# Patient Record
Sex: Female | Born: 1950 | Race: White | Hispanic: No | Marital: Single | Smoking: Never smoker
Health system: Southern US, Community
[De-identification: ages and names within clinical notes are randomized; demographics above are authoritative.]

## PROBLEM LIST (undated history)

## (undated) DIAGNOSIS — I10 Essential (primary) hypertension: Secondary | ICD-10-CM

## (undated) DIAGNOSIS — E7800 Pure hypercholesterolemia, unspecified: Secondary | ICD-10-CM

## (undated) DIAGNOSIS — K589 Irritable bowel syndrome without diarrhea: Secondary | ICD-10-CM

## (undated) DIAGNOSIS — I499 Cardiac arrhythmia, unspecified: Secondary | ICD-10-CM

## (undated) DIAGNOSIS — M069 Rheumatoid arthritis, unspecified: Secondary | ICD-10-CM

## (undated) DIAGNOSIS — R000 Tachycardia, unspecified: Secondary | ICD-10-CM

## (undated) HISTORY — DX: Rheumatoid arthritis, unspecified: M06.9

## (undated) HISTORY — DX: Pure hypercholesterolemia, unspecified: E78.00

## (undated) HISTORY — PX: TUBAL LIGATION: SHX77

---

## 2012-08-16 ENCOUNTER — Ambulatory Visit: Payer: Self-pay

## 2012-08-30 ENCOUNTER — Ambulatory Visit: Payer: Self-pay

## 2016-05-13 ENCOUNTER — Encounter (INDEPENDENT_AMBULATORY_CARE_PROVIDER_SITE_OTHER): Payer: Self-pay | Admitting: *Deleted

## 2016-05-28 ENCOUNTER — Encounter (INDEPENDENT_AMBULATORY_CARE_PROVIDER_SITE_OTHER): Payer: Self-pay | Admitting: *Deleted

## 2016-05-28 ENCOUNTER — Other Ambulatory Visit (INDEPENDENT_AMBULATORY_CARE_PROVIDER_SITE_OTHER): Payer: Self-pay | Admitting: *Deleted

## 2016-05-28 DIAGNOSIS — Z800 Family history of malignant neoplasm of digestive organs: Secondary | ICD-10-CM | POA: Insufficient documentation

## 2016-09-09 ENCOUNTER — Telehealth (INDEPENDENT_AMBULATORY_CARE_PROVIDER_SITE_OTHER): Payer: Self-pay | Admitting: *Deleted

## 2016-09-09 ENCOUNTER — Encounter (INDEPENDENT_AMBULATORY_CARE_PROVIDER_SITE_OTHER): Payer: Self-pay | Admitting: *Deleted

## 2016-09-09 NOTE — Telephone Encounter (Signed)
Referring MD/PCP: dolan   Procedure: tcs  Reason/Indication:  fam hx colon ca  Has patient had this procedure before?  Yes 6 yrs ago  If so, when, by whom and where?    Is there a family history of colon cancer?  Yes, father & sister  Who?  What age when diagnosed?    Is patient diabetic?   no      Does patient have prosthetic heart valve or mechanical valve?  no  Do you have a pacemaker?  no  Has patient ever had endocarditis? no  Has patient had joint replacement within last 12 months?  no  Does patient tend to be constipated or take laxatives? some  Does patient have a history of alcohol/drug use?  no  Is patient on Coumadin, Plavix and/or Aspirin? yes  Medications: asa 81 mg daily, benazepril 40 mg daily, furosemide 20 mg prn, naproxen 500 mg prn, pravastatin 40 mg daily, fexofenadine 60 mg prn, vit d3 200 iu daily, fish oil 1200 mg daily, meloxicam 15 mg prn, vir b injection once a month  Allergies: nkda  Medication Adjustment per Dr Laural Golden: asa 2 days  Procedure date & time: 10/07/16 at 730

## 2016-09-09 NOTE — Telephone Encounter (Signed)
Patient needs trilyte 

## 2016-09-10 MED ORDER — PEG 3350-KCL-NA BICARB-NACL 420 G PO SOLR: 4000.0000 mL | Freq: Once | ORAL | 0 refills | Status: AC

## 2016-09-10 NOTE — Telephone Encounter (Signed)
agree

## 2016-10-07 ENCOUNTER — Ambulatory Visit (HOSPITAL_COMMUNITY)
Admission: RE | Admit: 2016-10-07 | Discharge: 2016-10-07 | Disposition: A | Discharge status: Home / Self Care | Payer: Medicare HMO | Source: Ambulatory Visit | Attending: Internal Medicine | Admitting: Internal Medicine

## 2016-10-07 ENCOUNTER — Encounter (HOSPITAL_COMMUNITY): Payer: Self-pay

## 2016-10-07 ENCOUNTER — Encounter (HOSPITAL_COMMUNITY)
Admission: RE | Disposition: A | Discharge status: Home / Self Care | Payer: Self-pay | Source: Ambulatory Visit | Attending: Internal Medicine

## 2016-10-07 DIAGNOSIS — K644 Residual hemorrhoidal skin tags: Secondary | ICD-10-CM

## 2016-10-07 DIAGNOSIS — Z7982 Long term (current) use of aspirin: Secondary | ICD-10-CM | POA: Diagnosis not present

## 2016-10-07 DIAGNOSIS — Z79899 Other long term (current) drug therapy: Secondary | ICD-10-CM | POA: Diagnosis not present

## 2016-10-07 DIAGNOSIS — K589 Irritable bowel syndrome without diarrhea: Secondary | ICD-10-CM | POA: Diagnosis not present

## 2016-10-07 DIAGNOSIS — Z1211 Encounter for screening for malignant neoplasm of colon: Secondary | ICD-10-CM | POA: Insufficient documentation

## 2016-10-07 DIAGNOSIS — Z800 Family history of malignant neoplasm of digestive organs: Secondary | ICD-10-CM | POA: Insufficient documentation

## 2016-10-07 DIAGNOSIS — I10 Essential (primary) hypertension: Secondary | ICD-10-CM | POA: Diagnosis not present

## 2016-10-07 HISTORY — PX: COLONOSCOPY: SHX5424

## 2016-10-07 HISTORY — DX: Irritable bowel syndrome, unspecified: K58.9

## 2016-10-07 HISTORY — DX: Essential (primary) hypertension: I10

## 2016-10-07 SURGERY — COLONOSCOPY
Anesthesia: Moderate Sedation

## 2016-10-07 MED ORDER — MIDAZOLAM HCL 5 MG/5ML IJ SOLN
INTRAMUSCULAR | Status: DC | PRN
  Administered 2016-10-07 (×2): 2 mg via INTRAVENOUS

## 2016-10-07 MED ORDER — SIMETHICONE 40 MG/0.6ML PO SUSP
ORAL | Status: AC
  Filled 2016-10-07: qty 30

## 2016-10-07 MED ORDER — MEPERIDINE HCL 50 MG/ML IJ SOLN
INTRAMUSCULAR | Status: DC | PRN
  Administered 2016-10-07 (×2): 25 mg via INTRAVENOUS

## 2016-10-07 MED ORDER — MIDAZOLAM HCL 5 MG/5ML IJ SOLN
INTRAMUSCULAR | Status: AC
  Filled 2016-10-07: qty 10

## 2016-10-07 MED ORDER — MEPERIDINE HCL 50 MG/ML IJ SOLN
INTRAMUSCULAR | Status: AC
  Filled 2016-10-07: qty 1

## 2016-10-07 MED ORDER — SODIUM CHLORIDE 0.9 % IV SOLN
INTRAVENOUS | Status: DC
  Administered 2016-10-07: 07:00:00 via INTRAVENOUS

## 2016-10-07 NOTE — Discharge Instructions (Signed)
Resume usual medications and diet. No driving for 24 hours. Next colonoscopy in 5 years.   Colonoscopy, Adult, Care After This sheet gives you information about how to care for yourself after your procedure. Your health care provider may also give you more specific instructions. If you have problems or questions, contact your health care provider.  Dr Laural Golden:  [phone] What can I expect after the procedure? After the procedure, it is common to have:  A small amount of blood in your stool for 24 hours after the procedure.  Some gas.  Mild abdominal cramping or bloating.  Follow these instructions at home: General instructions   For the first 24 hours after the procedure: ? Do not drive or use machinery. ? Do not sign important documents. ? Do not drink alcohol. ? Rest often.  Take over-the-counter or prescription medicines only as told by your health care provider.  It is up to you to get the results of your procedure. Ask your health care provider, or the department performing the procedure, when your results will be ready. Relieving cramping and bloating  Try walking around when you have cramps or feel bloated. Eating and drinking  Drink enough fluid to keep your urine clear or pale yellow.  Resume your normal diet as instructed by your health care provider. Avoid heavy or fried foods that are hard to digest.  Avoid drinking alcohol for as long as instructed by your health care provider. Contact a health care provider if:  You have blood in your stool 2-3 days after the procedure. Get help right away if:  You have more than a small spotting of blood in your stool.  You pass large blood clots in your stool.  Your abdomen is swollen.  You have nausea or vomiting.  You have a fever.  You have increasing abdominal pain that is not relieved with medicine. This information is not intended to replace advice given to you by your health care provider. Make sure you  discuss any questions you have with your health care provider. Document Released: 08/05/2003 Document Revised: 09/15/2015 Document Reviewed: 03/04/2015 Elsevier Interactive Patient Education  [employer].

## 2016-10-07 NOTE — H&P (Signed)
Tammy Jensen is an 66 y.o. female.   Chief Complaint: patient is here for colonoscopy. HPI: patient is 66 year old Caucasian female who is here for colonoscopy.she denies abdominal pain change in bowel habits or rectal bleeding. She has IBS and her bowels are always irregular. Last colonoscopy was normal in 2013. Family history significant for CRC and father who was in his late  21'sAnd her sister also had colon carcinoma at age 9 and is doing fine for years later.  Past Medical History:  Diagnosis Date  . Hypertension   . IBS (irritable bowel syndrome)     Past Surgical History:  Procedure Laterality Date  . TUBAL LIGATION      Family History  Problem Relation Age of Onset  . Colon cancer Father   . Colon cancer Sister    Social History:  reports that she has never smoked. She has never used smokeless tobacco. She reports that she drinks alcohol. She reports that she does not use drugs.  Allergies: No Known Allergies  Medications Prior to Admission  Medication Sig Dispense Refill  . aspirin 81 MG tablet Take 81 mg by mouth daily.    . benazepril (LOTENSIN) 40 MG tablet Take 40 mg by mouth daily.    . Cholecalciferol (D 2000) 2000 units TABS Take 2,000 Units by mouth daily.    . cyanocobalamin (,VITAMIN B-12,) 1000 MCG/ML injection Inject 1,000 mcg into the muscle every 30 (thirty) days. Due 10-06-11    . DEXILANT 30 MG capsule Take 30 mg by mouth daily as needed for indigestion.  0  . fexofenadine (ALLEGRA) 60 MG tablet Take 60 mg by mouth 2 (two) times daily as needed for allergies or rhinitis.    . furosemide (LASIX) 20 MG tablet Take 20 mg by mouth daily.    . hydroxypropyl methylcellulose / hypromellose (ISOPTO TEARS / GONIOVISC) 2.5 % ophthalmic solution Place 1 drop into both eyes as needed for dry eyes.    . naproxen (NAPROSYN) 500 MG tablet Take 500 mg by mouth 2 (two) times daily as needed for mild pain.    . Omega-3 Fatty Acids (FISH OIL) 1200 MG CAPS Take 1,200 mg  by mouth daily.    . pravastatin (PRAVACHOL) 40 MG tablet Take 40 mg by mouth every evening.    . triamcinolone cream (KENALOG) 0.1 % Apply 1 application topically daily as needed (rash).      No results found for this or any previous visit (from the past 48 hour(s)). No results found.  ROS  Blood pressure (!) 143/89, pulse 65, temperature 97.7 F (36.5 C), temperature source Oral, resp. rate 12, height 5\' 3"  (1.6 m), weight 179 lb (81.2 kg), SpO2 99 %. Physical Exam  Constitutional: She appears well-developed and well-nourished.  HENT:  Mouth/Throat: Oropharynx is clear and moist.  Eyes: Conjunctivae are normal. No scleral icterus.  Neck: No thyromegaly present.  Cardiovascular: Normal rate, regular rhythm and normal heart sounds.   No murmur heard. Respiratory: Effort normal and breath sounds normal.  GI: Soft. She exhibits no distension. There is no tenderness.  Musculoskeletal: She exhibits no edema.  Lymphadenopathy:    She has no cervical adenopathy.  Neurological: She is alert.  Skin: Skin is warm and dry.     Assessment/Plan High risk screening colonoscopy.  Hildred Laser, MD 10/07/2016, 7:33 AM

## 2016-10-07 NOTE — Op Note (Signed)
[facility] Patient Name: Tammy Jensen Procedure Date: 10/07/2016 7:11 AM MRN: [id number] Date of Birth: 1950/09/06 Attending MD: Hildred Laser , MD CSN: [id number] Age: 66 Admit Type: Outpatient Procedure:                Colonoscopy Indications:              Screening in patient at increased risk: Colorectal                            cancer in father 45 or older, Screening in patient                            at increased risk: Colorectal cancer in sister 12                            or older Providers:                Hildred Laser, MD, Janeece Riggers, RN, Randa Spike,                            Technician Referring MD:             Margy Clarks, NP Medicines:                Meperidine 50 mg IV, Midazolam 5 mg IV Complications:            No immediate complications. Estimated Blood Loss:     Estimated blood loss: none. Procedure:                Pre-Anesthesia Assessment:                           - Prior to the procedure, a History and Physical                            was performed, and patient medications and                            allergies were reviewed. The patient's tolerance of                            previous anesthesia was also reviewed. The risks                            and benefits of the procedure and the sedation                            options and risks were discussed with the patient.                            All questions were answered, and informed consent                            was obtained. Prior Anticoagulants: The patient  last took aspirin 4 days prior to the procedure.                            ASA Grade Assessment: II - A patient with mild                            systemic disease. After reviewing the risks and                            benefits, the patient was deemed in satisfactory                            condition to undergo the procedure.                           After obtaining informed  consent, the colonoscope                            was passed under direct vision. Throughout the                            procedure, the patient's blood pressure, pulse, and                            oxygen saturations were monitored continuously. The                            EC-[id number] ([id number]) scope was introduced through                            the anus and advanced to the the cecum, identified                            by appendiceal orifice and ileocecal valve. The                            colonoscopy was performed without difficulty. The                            patient tolerated the procedure well. The quality                            of the bowel preparation was excellent. The                            ileocecal valve, appendiceal orifice, and rectum                            were photographed. Scope In: 7:42:44 AM Scope Out: 7:53:31 AM Scope Withdrawal Time: 0 hours 6 minutes 24 seconds  Total Procedure Duration: 0 hours 10 minutes 47 seconds  Findings:      The perianal exam findings include skin tags.      The digital rectal exam was normal.  The colon (entire examined portion) appeared normal.      External hemorrhoids were found during retroflexion. The hemorrhoids       were small. Impression:               - Perianal skin tags found on perianal exam.                           - The entire examined colon is normal.                           - External hemorrhoids.                           - No specimens collected. Moderate Sedation:      Moderate (conscious) sedation was administered by the endoscopy nurse       and supervised by the endoscopist. The following parameters were       monitored: oxygen saturation, heart rate, blood pressure, CO2       capnography and response to care. Total physician intraservice time was       17 minutes. Recommendation:           - Patient has a contact number available for                            emergencies.  The signs and symptoms of potential                            delayed complications were discussed with the                            patient. Return to normal activities tomorrow.                            Written discharge instructions were provided to the                            patient.                           - Resume previous diet today.                           - Continue present medications.                           - Repeat colonoscopy in 5 years for screening                            purposes. Procedure Code(s):        --- Professional ---                           [id number], Colonoscopy, flexible; diagnostic, including                            collection of specimen(s) by brushing or washing,  when performed (separate procedure)                           99152, Moderate sedation services provided by the                            same physician or other qualified health care                            professional performing the diagnostic or                            therapeutic service that the sedation supports,                            requiring the presence of an independent trained                            observer to assist in the monitoring of the                            patient's level of consciousness and physiological                            status; initial 15 minutes of intraservice time,                            patient age 19 years or older Diagnosis Code(s):        --- Professional ---                           K64.4, Residual hemorrhoidal skin tags                           Z80.0, Family history of malignant neoplasm of                            digestive organs CPT copyright 2016 American Medical Association. All rights reserved. The codes documented in this report are preliminary and upon coder review may  be revised to meet current compliance requirements. Hildred Laser, MD Hildred Laser, MD 10/07/2016  8:02:14 AM This report has been signed electronically. Number of Addenda: 0

## 2016-10-11 ENCOUNTER — Encounter (HOSPITAL_COMMUNITY): Payer: Self-pay | Admitting: Internal Medicine

## 2017-12-03 ENCOUNTER — Emergency Department (HOSPITAL_COMMUNITY): Payer: 59

## 2017-12-03 ENCOUNTER — Encounter (HOSPITAL_COMMUNITY): Payer: Self-pay | Admitting: Emergency Medicine

## 2017-12-03 ENCOUNTER — Emergency Department (HOSPITAL_COMMUNITY)
Admission: EM | Admit: 2017-12-03 | Discharge: 2017-12-03 | Disposition: A | Discharge status: Home / Self Care | Payer: 59 | Attending: Emergency Medicine | Admitting: Emergency Medicine

## 2017-12-03 ENCOUNTER — Other Ambulatory Visit: Payer: Self-pay

## 2017-12-03 DIAGNOSIS — Y939 Activity, unspecified: Secondary | ICD-10-CM | POA: Insufficient documentation

## 2017-12-03 DIAGNOSIS — Y999 Unspecified external cause status: Secondary | ICD-10-CM | POA: Diagnosis not present

## 2017-12-03 DIAGNOSIS — S0990XA Unspecified injury of head, initial encounter: Secondary | ICD-10-CM | POA: Diagnosis present

## 2017-12-03 DIAGNOSIS — Z7982 Long term (current) use of aspirin: Secondary | ICD-10-CM | POA: Diagnosis not present

## 2017-12-03 DIAGNOSIS — Y92008 Other place in unspecified non-institutional (private) residence as the place of occurrence of the external cause: Secondary | ICD-10-CM | POA: Diagnosis not present

## 2017-12-03 DIAGNOSIS — Z79899 Other long term (current) drug therapy: Secondary | ICD-10-CM | POA: Diagnosis not present

## 2017-12-03 DIAGNOSIS — S93602A Unspecified sprain of left foot, initial encounter: Secondary | ICD-10-CM | POA: Insufficient documentation

## 2017-12-03 DIAGNOSIS — R55 Syncope and collapse: Secondary | ICD-10-CM | POA: Diagnosis not present

## 2017-12-03 DIAGNOSIS — I10 Essential (primary) hypertension: Secondary | ICD-10-CM | POA: Diagnosis not present

## 2017-12-03 DIAGNOSIS — W108XXA Fall (on) (from) other stairs and steps, initial encounter: Secondary | ICD-10-CM | POA: Diagnosis not present

## 2017-12-03 LAB — URINALYSIS, ROUTINE W REFLEX MICROSCOPIC
Bacteria, UA: NONE SEEN
Bilirubin Urine: NEGATIVE
Glucose, UA: NEGATIVE mg/dL
Ketones, ur: NEGATIVE mg/dL
Nitrite: NEGATIVE
Protein, ur: NEGATIVE mg/dL
SPECIFIC GRAVITY, URINE: 1.011 (ref 1.005–1.030)
pH: 6 (ref 5.0–8.0)

## 2017-12-03 LAB — COMPREHENSIVE METABOLIC PANEL
ALBUMIN: 4.3 g/dL (ref 3.5–5.0)
ALT: 43 U/L (ref 0–44)
AST: 46 U/L — AB (ref 15–41)
Alkaline Phosphatase: 87 U/L (ref 38–126)
Anion gap: 8 (ref 5–15)
BUN: 14 mg/dL (ref 8–23)
CHLORIDE: 106 mmol/L (ref 98–111)
CO2: 23 mmol/L (ref 22–32)
CREATININE: 0.79 mg/dL (ref 0.44–1.00)
Calcium: 9 mg/dL (ref 8.9–10.3)
GFR calc Af Amer: >60 mL/min (ref >60)
GFR calc non Af Amer: >60 mL/min (ref >60)
GLUCOSE: 108 mg/dL — AB (ref 70–99)
Potassium: 3.6 mmol/L (ref 3.5–5.1)
SODIUM: 137 mmol/L (ref 135–145)
Total Bilirubin: 0.8 mg/dL (ref 0.3–1.2)
Total Protein: 8.1 g/dL (ref 6.5–8.1)

## 2017-12-03 LAB — CBC WITH DIFFERENTIAL/PLATELET
Abs Immature Granulocytes: 0.01 10*3/uL (ref 0.00–0.07)
BASOS ABS: 0.1 10*3/uL (ref 0.0–0.1)
BASOS PCT: 1 %
EOS ABS: 0.2 10*3/uL (ref 0.0–0.5)
Eosinophils Relative: 2 %
HCT: 45.6 % (ref 36.0–46.0)
Hemoglobin: 14.9 g/dL (ref 12.0–15.0)
IMMATURE GRANULOCYTES: 0 %
LYMPHS ABS: 1.6 10*3/uL (ref 0.7–4.0)
Lymphocytes Relative: 24 %
MCH: 29.2 pg (ref 26.0–34.0)
MCHC: 32.7 g/dL (ref 30.0–36.0)
MCV: 89.4 fL (ref 80.0–100.0)
Monocytes Absolute: 0.5 10*3/uL (ref 0.1–1.0)
Monocytes Relative: 7 %
NEUTROS PCT: 66 %
NRBC: 0 % (ref 0.0–0.2)
Neutro Abs: 4.5 10*3/uL (ref 1.7–7.7)
PLATELETS: 235 10*3/uL (ref 150–400)
RBC: 5.1 MIL/uL (ref 3.87–5.11)
RDW: 12.9 % (ref 11.5–15.5)
WBC: 6.9 10*3/uL (ref 4.0–10.5)

## 2017-12-03 LAB — CBG MONITORING, ED: Glucose-Capillary: 107 mg/dL — ABNORMAL HIGH (ref 70–99)

## 2017-12-03 LAB — TROPONIN I

## 2017-12-03 MED ORDER — IBUPROFEN 600 MG PO TABS: 600.0000 mg | ORAL_TABLET | Freq: Four times a day (QID) | ORAL | 0 refills | Status: DC | PRN

## 2017-12-03 MED ORDER — SODIUM CHLORIDE 0.9 % IV SOLN
INTRAVENOUS | Status: DC
  Administered 2017-12-03: 17:00:00 via INTRAVENOUS

## 2017-12-03 MED ORDER — SODIUM CHLORIDE 0.9 % IV BOLUS
1000.0000 mL | Freq: Once | INTRAVENOUS | Status: AC
  Administered 2017-12-03: 1000 mL via INTRAVENOUS

## 2017-12-03 MED ORDER — HYDROCODONE-ACETAMINOPHEN 5-325 MG PO TABS: 1.0000 | ORAL_TABLET | ORAL | 0 refills | Status: DC | PRN

## 2017-12-03 NOTE — ED Provider Notes (Signed)
[facility] EMERGENCY DEPARTMENT Provider Note   CSN: [id number] Arrival date & time: 12/03/17  1416     History   Chief Complaint Chief Complaint  Patient presents with  . Loss of Consciousness    HPI Tammy Jensen is a 67 y.o. female.  Pt presents to the ED today with LOC and left ankle pain.  Pt was walking down her stairs on Thursday, 11/ 28.  She missed a step and fell.  She does not remember falling.  She woke up on the ground.  Her daughter said she was awake.  The pt said she had a syncopal episode in September, but never saw the doctor.  Pt c/o left ankle and foot pain and bilateral knee and hip pain and LBP.       Past Medical History:  Diagnosis Date  . Hypertension   . IBS (irritable bowel syndrome)     Patient Active Problem List   Diagnosis Date Noted  . Family history of colon cancer 05/28/2016    Past Surgical History:  Procedure Laterality Date  . COLONOSCOPY N/A 10/07/2016   Procedure: COLONOSCOPY;  Surgeon: Rogene Houston, MD;  Location: [facility] ENDO SUITE;  Service: Endoscopy;  Laterality: N/A;  730  . TUBAL LIGATION       OB History   None      Home Medications    Prior to Admission medications   Medication Sig Start Date End Date Taking? Authorizing Provider  aspirin 81 MG tablet Take 81 mg by mouth daily. 03/10/06  Yes [provider]  benazepril (LOTENSIN) 40 MG tablet Take 40 mg by mouth daily. 03/14/06  Yes [provider]  Cholecalciferol (D 2000) 2000 units TABS Take 2,000 Units by mouth daily.   Yes [provider]  cyanocobalamin (,VITAMIN B-12,) 1000 MCG/ML injection Inject 1,000 mcg into the muscle every 30 (thirty) days. Due 10-06-11   Yes [provider]  DEXILANT 30 MG capsule Take 30 mg by mouth daily as needed for indigestion. 09/16/16  Yes [provider]  fexofenadine (ALLEGRA) 60 MG tablet Take 60 mg by mouth 2 (two) times daily as needed for allergies or rhinitis.   Yes [provider]  furosemide (LASIX) 20 MG tablet Take 20 mg by mouth daily.   Yes [provider]  hydroxypropyl methylcellulose / hypromellose (ISOPTO TEARS / GONIOVISC) 2.5 % ophthalmic solution Place 1 drop into both eyes as needed for dry eyes.   Yes [provider]  naproxen (NAPROSYN) 500 MG tablet Take 500 mg by mouth 2 (two) times daily as needed for mild pain.   Yes [provider]  Omega-3 Fatty Acids (FISH OIL) 1200 MG CAPS Take 1,200 mg by mouth daily.   Yes [provider]  pravastatin (PRAVACHOL) 40 MG tablet Take 40 mg by mouth every evening.   Yes [provider]  triamcinolone cream (KENALOG) 0.1 % Apply 1 application topically daily as needed (rash).   Yes [provider]  HYDROcodone-acetaminophen (NORCO/VICODIN) 5-325 MG tablet Take 1 tablet by mouth every 4 (four) hours as needed. 12/03/17   Tammy Pence, MD  ibuprofen (ADVIL,MOTRIN) 600 MG tablet Take 1 tablet (600 mg total) by mouth every 6 (six) hours as needed. 12/03/17   Tammy Pence, MD    Family History Family History  Problem Relation Age of Onset  . Colon cancer Father   . Colon cancer Sister     Social History Social History   Tobacco Use  .  Smoking status: Never Smoker  . Smokeless tobacco: Never Used  Substance Use Topics  . Alcohol use: Yes    Comment: occasional  . Drug use: No     Allergies   Patient has no known allergies.   Review of Systems Review of Systems  Musculoskeletal: Positive for back pain.       Left foot and ankle pain Bilateral knee pain Bilateral hip pain  All other systems reviewed and are negative.    Physical Exam Updated Vital Signs BP (!) 140/99   Pulse 81   Temp 98.2 F (36.8 C) (Oral)   Resp (!) 23   Ht 5\' 3"  (1.6 m)   Wt 81.6 kg   SpO2 98%   BMI 31.89 kg/m   Physical Exam  Constitutional: She is oriented to person, place, and time. She appears well-developed and well-nourished.  HENT:    Head: Normocephalic and atraumatic.  Right Ear: External ear normal.  Left Ear: External ear normal.  Nose: Nose normal.  Mouth/Throat: Oropharynx is clear and moist.  Eyes: Pupils are equal, round, and reactive to light. Conjunctivae and EOM are normal.  Neck: Normal range of motion. Neck supple.  Cardiovascular: Normal rate, regular rhythm, normal heart sounds and intact distal pulses.  Pulmonary/Chest: Effort normal and breath sounds normal.  Abdominal: Soft. Bowel sounds are normal.  Musculoskeletal:       Right hip: She exhibits tenderness.       Left hip: She exhibits tenderness.       Right knee: Tenderness found.       Left knee: Tenderness found.       Left ankle: She exhibits decreased range of motion, swelling, ecchymosis and deformity. Tenderness.       Lumbar back: She exhibits tenderness.  Neurological: She is alert and oriented to person, place, and time.  Skin: Skin is warm and dry. Capillary refill takes less than 2 seconds.  Psychiatric: She has a normal mood and affect. Her behavior is normal. Judgment and thought content normal.  Nursing note and vitals reviewed.    ED Treatments / Results  Labs (all labs ordered are listed, but only abnormal results are displayed) Labs Reviewed  COMPREHENSIVE METABOLIC PANEL - Abnormal; Notable for the following components:      Result Value   Glucose, Bld 108 (*)    AST 46 (*)    All other components within normal limits  URINALYSIS, ROUTINE W REFLEX MICROSCOPIC - Abnormal; Notable for the following components:   Hgb urine dipstick SMALL (*)    Leukocytes, UA TRACE (*)    Non Squamous Epithelial 0-5 (*)    All other components within normal limits  CBG MONITORING, ED - Abnormal; Notable for the following components:   Glucose-Capillary 107 (*)    All other components within normal limits  CBC WITH DIFFERENTIAL/PLATELET  TROPONIN I    EKG None  Radiology Dg Chest 2 View  Result Date: 12/03/2017 CLINICAL DATA:   Fall.  Syncope.  Pain. EXAM: CHEST - 2 VIEW COMPARISON:  None. FINDINGS: The heart, hila, and mediastinum are normal. No pneumothorax. No pulmonary nodules, masses, or focal infiltrates. No convincing evidence of fracture. IMPRESSION: No active cardiopulmonary disease. Electronically Signed   By: Dorise Bullion III M.D   On: 12/03/2017 16:24   Dg Lumbar Spine Complete  Result Date: 12/03/2017 CLINICAL DATA:  Pain after fall EXAM: LUMBAR SPINE - COMPLETE 4+ VIEW COMPARISON:  None. FINDINGS: No fracture or traumatic malalignment. Calcified atherosclerosis  in the abdominal aorta. Minimal degenerative disc disease. No other acute abnormalities. IMPRESSION: No fracture or traumatic malalignment. Electronically Signed   By: Dorise Bullion III M.D   On: 12/03/2017 16:25   Dg Pelvis 1-2 Views  Result Date: 12/03/2017 CLINICAL DATA:  Pain after fall EXAM: PELVIS - 1-2 VIEW COMPARISON:  None. FINDINGS: Degenerative changes at the pubic symphysis. No fractures are noted. IMPRESSION: Negative. Electronically Signed   By: Dorise Bullion III M.D   On: 12/03/2017 16:27   Dg Ankle Complete Left  Result Date: 12/03/2017 CLINICAL DATA:  Pain after fall EXAM: LEFT ANKLE COMPLETE - 3+ VIEW COMPARISON:  None. FINDINGS: Soft tissue swelling in the ankle. IMPRESSION: Negative. Electronically Signed   By: Dorise Bullion III M.D   On: 12/03/2017 16:37   Ct Head Wo Contrast  Result Date: 12/03/2017 CLINICAL DATA:  Syncopal episodes.  Post fall. EXAM: CT HEAD WITHOUT CONTRAST TECHNIQUE: Contiguous axial images were obtained from the base of the skull through the vertex without intravenous contrast. COMPARISON:  None. FINDINGS: Brain: No evidence of acute infarction, hemorrhage, hydrocephalus, extra-axial collection or mass lesion/mass effect. Vascular: Mild calcific atherosclerotic disease of the intra cavernous carotid arteries. Skull: Normal. Negative for fracture or focal lesion. Sinuses/Orbits: No acute finding.  Other: None. IMPRESSION: No acute intracranial abnormality. Electronically Signed   By: Fidela Salisbury M.D.   On: 12/03/2017 16:00   Dg Knee Complete 4 Views Left  Result Date: 12/03/2017 CLINICAL DATA:  Pain after fall EXAM: LEFT KNEE - COMPLETE 4+ VIEW COMPARISON:  None. FINDINGS: No evidence of fracture, dislocation, or joint effusion. No evidence of arthropathy or other focal bone abnormality. Soft tissues are unremarkable. IMPRESSION: Negative. Electronically Signed   By: Dorise Bullion III M.D   On: 12/03/2017 16:29   Dg Knee Complete 4 Views Right  Result Date: 12/03/2017 CLINICAL DATA:  Pain after fall EXAM: RIGHT KNEE - COMPLETE 4+ VIEW COMPARISON:  None. FINDINGS: No evidence of fracture, dislocation, or joint effusion. No evidence of arthropathy or other focal bone abnormality. Soft tissues are unremarkable. IMPRESSION: Negative. Electronically Signed   By: Dorise Bullion III M.D   On: 12/03/2017 16:28   Dg Foot Complete Left  Result Date: 12/03/2017 CLINICAL DATA:  Pain after fall EXAM: LEFT FOOT - COMPLETE 3+ VIEW COMPARISON:  None. FINDINGS: Soft tissue calcifications along the lateral midfoot likely represent multi partite os perineum. An os posterior to the talus on the lateral view is most consistent with an os trigonum. There may be soft tissue swelling posterior to the os trigonum on the lateral view only. No definitive acute fractures noted. IMPRESSION: 1. Increased attenuation in the fat posterior to the suspected os trigonum may represent soft tissue edema/sequela of trauma. Recommend clinical correlation. If there is high concern in this region, a CT scan may better evaluate. 2. No convincing evidence of acute fracture on this study. Electronically Signed   By: Dorise Bullion III M.D   On: 12/03/2017 16:46    Procedures Procedures (including critical care time)  Medications Ordered in ED Medications  sodium chloride 0.9 % bolus 1,000 mL (1,000 mLs Intravenous New  Bag/Given 12/03/17 1520)    And  0.9 %  sodium chloride infusion ( Intravenous New Bag/Given 12/03/17 1645)     Initial Impression / Assessment and Plan / ED Course  I have reviewed the triage vital signs and the nursing notes.  Pertinent labs & imaging results that were available during my care of the  patient were reviewed by me and considered in my medical decision making (see chart for details).    Pt will be placed in a cam walker and instructed to f/u with ortho.  It does not sound like she had true syncope as her daughter said she was awake, so I don't think she needs admission.  The syncopal work up here was negative.  The pt knows to return if worse.  Final Clinical Impressions(s) / ED Diagnoses   Final diagnoses:  Syncope, unspecified syncope type  Foot sprain, left, initial encounter    ED Discharge Orders         Ordered    HYDROcodone-acetaminophen (NORCO/VICODIN) 5-325 MG tablet  Every 4 hours PRN     12/03/17 1700    ibuprofen (ADVIL,MOTRIN) 600 MG tablet  Every 6 hours PRN     12/03/17 1700           Tammy Pence, MD 12/03/17 1702

## 2017-12-03 NOTE — ED Notes (Signed)
Continues in Rad 

## 2017-12-03 NOTE — ED Notes (Signed)
Syncopal  Episode on Thursday  Seen in fast track for ankle injury  Has had syncopal episodes in the past

## 2017-12-03 NOTE — ED Triage Notes (Addendum)
Patient c/o left ankle pain after having syncopal episode Thursday while walking down porch stairs. Per patient woke up on the ground with pain in the ankle. Denies headache, blurred vision, dizziness. Denies taking any anticoagulants. Per patient has had syncope in past but never checked for it. Patient denies any other pain. Swelling to left ankle and foot.

## 2017-12-03 NOTE — ED Notes (Signed)
Dr H in to assess

## 2017-12-03 NOTE — ED Notes (Signed)
Pt reports she has no idea when she is going to pass out States she has no hx of seizures, HI   Monitor SR with 1 PVC noted  She reports she has never spoken to her PCP regarding passing out

## 2017-12-03 NOTE — ED Notes (Signed)
Ortho VS  Lying  137/104,83,16 99 per cent RA  Sitting  140/99, 89,16.98 per cent RA  Standing  118/93, 92,20,98 per cet RA

## 2018-03-03 ENCOUNTER — Emergency Department (HOSPITAL_COMMUNITY)
Admission: EM | Admit: 2018-03-03 | Discharge: 2018-03-03 | Disposition: A | Discharge status: Home / Self Care | Payer: 59 | Attending: Emergency Medicine | Admitting: Emergency Medicine

## 2018-03-03 ENCOUNTER — Encounter (HOSPITAL_COMMUNITY): Payer: Self-pay | Admitting: Emergency Medicine

## 2018-03-03 ENCOUNTER — Emergency Department (HOSPITAL_COMMUNITY): Payer: 59

## 2018-03-03 ENCOUNTER — Other Ambulatory Visit: Payer: Self-pay

## 2018-03-03 DIAGNOSIS — M25512 Pain in left shoulder: Secondary | ICD-10-CM | POA: Insufficient documentation

## 2018-03-03 DIAGNOSIS — Z79899 Other long term (current) drug therapy: Secondary | ICD-10-CM | POA: Insufficient documentation

## 2018-03-03 DIAGNOSIS — Z7982 Long term (current) use of aspirin: Secondary | ICD-10-CM | POA: Insufficient documentation

## 2018-03-03 DIAGNOSIS — I10 Essential (primary) hypertension: Secondary | ICD-10-CM | POA: Insufficient documentation

## 2018-03-03 MED ORDER — HYDROCODONE-ACETAMINOPHEN 5-325 MG PO TABS: 2.0000 | ORAL_TABLET | ORAL | 0 refills | Status: DC | PRN

## 2018-03-03 NOTE — ED Provider Notes (Signed)
[facility] EMERGENCY DEPARTMENT Provider Note   CSN: [id number] Arrival date & time: 03/03/18  1014    History   Chief Complaint Chief Complaint  Patient presents with  . Arm Pain    HPI Tammy Jensen is a 68 y.o. female.     HPI   She complains of pain in her left shoulder radiating to her left trapezius region.  Pain started yesterday after moving some laminate flooring.  She had trouble sleeping because of the pain and did not improve when she took ibuprofen.  She could not work today, because of the discomfort.  She came here for evaluation, by private vehicle.  No prior injuries to the left shoulder or neck.  She did not fall.  There are no other known modifying factors.  Past Medical History:  Diagnosis Date  . Hypertension   . IBS (irritable bowel syndrome)     Patient Active Problem List   Diagnosis Date Noted  . Family history of colon cancer 05/28/2016    Past Surgical History:  Procedure Laterality Date  . COLONOSCOPY N/A 10/07/2016   Procedure: COLONOSCOPY;  Surgeon: Rogene Houston, MD;  [facility];  Service: Endoscopy;  Laterality: N/A;  730  . TUBAL LIGATION       OB History   No obstetric history on file.      Home Medications    Prior to Admission medications   Medication Sig Start Date End Date Taking? Authorizing Provider  aspirin 81 MG tablet Take 81 mg by mouth daily. 03/10/06   [provider]  benazepril (LOTENSIN) 40 MG tablet Take 40 mg by mouth daily. 03/14/06   [provider]  Cholecalciferol (D 2000) 2000 units TABS Take 2,000 Units by mouth daily.    [provider]  cyanocobalamin (,VITAMIN B-12,) 1000 MCG/ML injection Inject 1,000 mcg into the muscle every 30 (thirty) days. Due 10-06-11    [provider]  DEXILANT 30 MG capsule Take 30 mg by mouth daily as needed for indigestion. 09/16/16   [provider]  fexofenadine (ALLEGRA) 60 MG tablet Take 60 mg by mouth 2 (two)  times daily as needed for allergies or rhinitis.    [provider]  furosemide (LASIX) 20 MG tablet Take 20 mg by mouth daily.    [provider]  HYDROcodone-acetaminophen (NORCO) 5-325 MG tablet Take 2 tablets by mouth every 4 (four) hours as needed for moderate pain. 03/03/18   Daleen Bo, MD  hydroxypropyl methylcellulose / hypromellose (ISOPTO TEARS / GONIOVISC) 2.5 % ophthalmic solution Place 1 drop into both eyes as needed for dry eyes.    [provider]  ibuprofen (ADVIL,MOTRIN) 600 MG tablet Take 1 tablet (600 mg total) by mouth every 6 (six) hours as needed. 12/03/17   Isla Pence, MD  naproxen (NAPROSYN) 500 MG tablet Take 500 mg by mouth 2 (two) times daily as needed for mild pain.    [provider]  Omega-3 Fatty Acids (FISH OIL) 1200 MG CAPS Take 1,200 mg by mouth daily.    [provider]  pravastatin (PRAVACHOL) 40 MG tablet Take 40 mg by mouth every evening.    [provider]  triamcinolone cream (KENALOG) 0.1 % Apply 1 application topically daily as needed (rash).    [provider]    Family History Family History  Problem Relation Age of Onset  . Colon cancer Father   . Colon cancer Sister     Social History Social  History   Tobacco Use  . Smoking status: Never Smoker  . Smokeless tobacco: Never Used  Substance Use Topics  . Alcohol use: Yes    Comment: occasional  . Drug use: No     Allergies   Patient has no known allergies.   Review of Systems Review of Systems  All other systems reviewed and are negative.    Physical Exam Updated Vital Signs BP 133/77 (BP Location: Right Arm)   Pulse 73   Temp 98.2 F (36.8 C) (Oral)   Resp 16   Ht 5\' 3"  (1.6 m)   Wt 81.6 kg   SpO2 99%   BMI 31.89 kg/m   Physical Exam Vitals signs and nursing note reviewed.  Constitutional:      General: She is not in acute distress.    Appearance: She is well-developed. She is obese. She is not  ill-appearing, toxic-appearing or diaphoretic.     Comments: Elderly, frail  HENT:     Head: Normocephalic and atraumatic.  Eyes:     Conjunctiva/sclera: Conjunctivae normal.     Pupils: Pupils are equal, round, and reactive to light.  Neck:     Musculoskeletal: Normal range of motion and neck supple.     Trachea: Phonation normal.  Cardiovascular:     Rate and Rhythm: Normal rate.  Pulmonary:     Effort: Pulmonary effort is normal.  Musculoskeletal:     Comments: She guards against movement of the left shoulder in all directions.  Motion is better with passive manipulation.  No shoulder deformity.  Mild tenderness left trapezius, and left shoulder diffusely.  Neurovascular, motion and function intact distally in the left wrist and hand.  Skin:    General: Skin is warm and dry.  Neurological:     Mental Status: She is alert and oriented to person, place, and time.     Motor: No abnormal muscle tone.  Psychiatric:        Behavior: Behavior normal.        Thought Content: Thought content normal.        Judgment: Judgment normal.      ED Treatments / Results  Labs (all labs ordered are listed, but only abnormal results are displayed) Labs Reviewed - No data to display  EKG None  Radiology Dg Shoulder Left  Result Date: 03/03/2018 CLINICAL DATA:  She said that she had to carry flooring into the house on Wednesday. She started having severe lt shoulder pain yesterday. She is unable to really move or raise arm EXAM: LEFT SHOULDER - 2+ VIEW COMPARISON:  None. FINDINGS: No fracture or bone lesion. Glenohumeral and AC joints are normally spaced and aligned. No significant arthropathic/degenerative change. Bones are demineralized. Soft tissues are unremarkable. IMPRESSION: No fracture or joint abnormality.  No bone lesion. Electronically Signed   By: Lajean Manes M.D.   On: 03/03/2018 11:38    Procedures Procedures (including critical care time)  Medications Ordered in  ED Medications - No data to display   Initial Impression / Assessment and Plan / ED Course  I have reviewed the triage vital signs and the nursing notes.  Pertinent labs & imaging results that were available during my care of the patient were reviewed by me and considered in my medical decision making (see chart for details).  Clinical Course as of Mar 03 1242  Fri Mar 03, 2018  1235 Arm sling ordered to be applied by nursing.   [EW]    Clinical Course  User Index [EW] Daleen Bo, MD       *  Patient Vitals for the past 24 hrs:  BP Temp Temp src Pulse Resp SpO2 Height Weight  03/03/18 1030 - - - - - - 5\' 3"  (1.6 m) 81.6 kg  03/03/18 1027 133/77 98.2 F (36.8 C) Oral 73 16 99 % - -    12:44 PM Reevaluation with update and discussion. After initial assessment and treatment, an updated evaluation reveals no change in clinical status.  Findings discussed with the patient and her daughter and all questions were answered. Daleen Bo   Medical Decision Making: Pain left shoulder secondary to unusual work activity.  Doubt fracture, cervical radiculopathy/myelopathy.  No indication for further ED treatment at this time.  CRITICAL CARE-no Performed by: Daleen Bo  Nursing Notes Reviewed/ Care Coordinated Applicable Imaging Reviewed Interpretation of Laboratory Data incorporated into ED treatment  The patient appears reasonably screened and/or stabilized for discharge and I doubt any other medical condition or other [facility] requiring further screening, evaluation, or treatment in the ED at this time prior to discharge.  Plan: Home Medications-ibuprofen 3 times daily, continue usual medications; Home Treatments-ice to affected area and sling for comfort; return here if the recommended treatment, does not improve the symptoms; Recommended follow up-PCP if not better in 1 week and as needed.   Final Clinical Impressions(s) / ED Diagnoses   Final diagnoses:  Acute pain of left  shoulder    ED Discharge Orders         Ordered    HYDROcodone-acetaminophen (NORCO) 5-325 MG tablet  Every 4 hours PRN     03/03/18 1242           Daleen Bo, MD 03/03/18 1244

## 2018-03-03 NOTE — ED Triage Notes (Signed)
Patient complains of left arm pain that started last night when moving packages of laminate flooring. States pain runs down from shoulder to elbow.

## 2018-03-03 NOTE — Discharge Instructions (Addendum)
Wear the sling for comfort.  Use ice on the sore area 3 or 4 times a day for 2 more days after that use heat.  When you start using heat you can begin to gently move the shoulder by swinging it forward and backwards, and in circles.  Gradually, you will be able to lift it up above your shoulder and head.  Continue taking ibuprofen 400 mg 3 times a day with meals for 1 week.  This can help for inflammation that occurs with your type of injury.  If you are not better in 1 week see your primary care doctor.  Do not drive when taking the narcotic pain reliever, hydrocodone.  You need to have at least 6 hours of time after taking the pill before driving.

## 2018-03-03 NOTE — ED Notes (Signed)
Patient transported to X-ray 

## 2018-09-21 ENCOUNTER — Other Ambulatory Visit: Payer: Self-pay

## 2018-09-21 ENCOUNTER — Encounter (HOSPITAL_COMMUNITY): Payer: Self-pay

## 2018-09-21 ENCOUNTER — Emergency Department (HOSPITAL_COMMUNITY)
Admission: EM | Admit: 2018-09-21 | Discharge: 2018-09-21 | Disposition: A | Discharge status: Home / Self Care | Payer: 59 | Attending: Emergency Medicine | Admitting: Emergency Medicine

## 2018-09-21 DIAGNOSIS — Y929 Unspecified place or not applicable: Secondary | ICD-10-CM | POA: Insufficient documentation

## 2018-09-21 DIAGNOSIS — Z79899 Other long term (current) drug therapy: Secondary | ICD-10-CM | POA: Diagnosis not present

## 2018-09-21 DIAGNOSIS — Y999 Unspecified external cause status: Secondary | ICD-10-CM | POA: Diagnosis not present

## 2018-09-21 DIAGNOSIS — I10 Essential (primary) hypertension: Secondary | ICD-10-CM | POA: Insufficient documentation

## 2018-09-21 DIAGNOSIS — Y939 Activity, unspecified: Secondary | ICD-10-CM | POA: Diagnosis not present

## 2018-09-21 DIAGNOSIS — S60572A Other superficial bite of hand of left hand, initial encounter: Secondary | ICD-10-CM | POA: Diagnosis not present

## 2018-09-21 DIAGNOSIS — Z7982 Long term (current) use of aspirin: Secondary | ICD-10-CM | POA: Diagnosis not present

## 2018-09-21 DIAGNOSIS — W5501XA Bitten by cat, initial encounter: Secondary | ICD-10-CM | POA: Diagnosis not present

## 2018-09-21 MED ORDER — AMOXICILLIN-POT CLAVULANATE 875-125 MG PO TABS: 1.0000 | ORAL_TABLET | Freq: Two times a day (BID) | ORAL | 0 refills | Status: AC

## 2018-09-21 MED ORDER — AMOXICILLIN-POT CLAVULANATE 875-125 MG PO TABS: 1.0000 | ORAL_TABLET | Freq: Two times a day (BID) | ORAL | 0 refills | Status: DC

## 2018-09-21 NOTE — ED Triage Notes (Signed)
Pt reports has been feeding a stray cat for the past month and the cat bit her r middle finger last night.  Pt says woke up this morning and it was red and swollen.

## 2018-09-21 NOTE — ED Provider Notes (Signed)
[facility] EMERGENCY DEPARTMENT Provider Note   CSN: [id number] Arrival date & time: 09/21/18  0809   History   Chief Complaint Chief Complaint  Patient presents with  . Animal Bite    HPI Tammy Jensen is a 68 y.o. female presenting to the emergency department with swelling of distal phalanges of middle finger on left hand after a stray cat bit her. The patient has been putting food out for the cat over the past several days and slowly building a relationship with it. Yesterday morning she was feeding the cat and got too close to its food when it bit her finger. The patient states her fingertip bled a lot, "it was dripping", but is taking aspirin 81mg  daily. She washed the bite with soap and water before covering it with Neosporin.  Later that day, she cleaned it again with hydrogen peroxide before covering it again with Neosporin.  This morning she noticed her fingertip was more red and more swollen compared to yesterday.  There is a particularly tender spot directly medial to the bite mark, she feels the redness has spread down her finger little bit more.  Otherwise, she denies fevers, chills, body aches, headaches, vision changes, chest pain, shortness of breath, nausea, vomiting, and abdominal pain.  She does not know if the cat is vaccinated against rabies.  Past Medical History:  Diagnosis Date  . Hypertension   . IBS (irritable bowel syndrome)     Patient Active Problem List   Diagnosis Date Noted  . Family history of colon cancer 05/28/2016    Past Surgical History:  Procedure Laterality Date  . COLONOSCOPY N/A 10/07/2016   Procedure: COLONOSCOPY;  Surgeon: Rogene Houston, MD;  [facility];  Service: Endoscopy;  Laterality: N/A;  730  . TUBAL LIGATION       OB History   No obstetric history on file.     Home Medications    Prior to Admission medications   Medication Sig Start Date End Date Taking? Authorizing Provider  aspirin 81 MG tablet Take  81 mg by mouth daily. 03/10/06   [provider]  benazepril (LOTENSIN) 40 MG tablet Take 40 mg by mouth daily. 03/14/06   [provider]  Cholecalciferol (D 2000) 2000 units TABS Take 2,000 Units by mouth daily.    [provider]  cyanocobalamin (,VITAMIN B-12,) 1000 MCG/ML injection Inject 1,000 mcg into the muscle every 30 (thirty) days. Due 10-06-11    [provider]  DEXILANT 30 MG capsule Take 30 mg by mouth daily as needed for indigestion. 09/16/16   [provider]  fexofenadine (ALLEGRA) 60 MG tablet Take 60 mg by mouth 2 (two) times daily as needed for allergies or rhinitis.    [provider]  furosemide (LASIX) 20 MG tablet Take 20 mg by mouth daily.    [provider]  HYDROcodone-acetaminophen (NORCO) 5-325 MG tablet Take 2 tablets by mouth every 4 (four) hours as needed for moderate pain. 03/03/18   Daleen Bo, MD  hydroxypropyl methylcellulose / hypromellose (ISOPTO TEARS / GONIOVISC) 2.5 % ophthalmic solution Place 1 drop into both eyes as needed for dry eyes.    [provider]  ibuprofen (ADVIL,MOTRIN) 600 MG tablet Take 1 tablet (600 mg total) by mouth every 6 (six) hours as needed. 12/03/17   Isla Pence, MD  naproxen (NAPROSYN) 500 MG tablet Take 500 mg by mouth 2 (two) times daily as needed for mild pain.    [provider]  Omega-3 Fatty Acids (FISH OIL) 1200 MG CAPS Take 1,200 mg by mouth daily.    [provider]  pravastatin (PRAVACHOL) 40 MG tablet Take 40 mg by mouth every evening.    [provider]  triamcinolone cream (KENALOG) 0.1 % Apply 1 application topically daily as needed (rash).    [provider]    Family History Family History  Problem Relation Age of Onset  . Colon cancer Father   . Colon cancer Sister     Social History Social History   Tobacco Use  . Smoking status: Never Smoker  . Smokeless tobacco: Never Used  Substance Use  Topics  . Alcohol use: Yes    Comment: occasional  . Drug use: No     Allergies   Patient has no known allergies.   Review of Systems Review of Systems - see HPI   Physical Exam Updated Vital Signs BP (!) 159/99 (BP Location: Left Arm)   Pulse 90   Temp 98.2 F (36.8 C) (Oral)   Resp 18   Ht 5\' 3"  (1.6 m)   Wt 81.6 kg   SpO2 100%   BMI 31.89 kg/m   Physical Exam Constitutional:      Appearance: She is not toxic-appearing.  Cardiovascular:     Rate and Rhythm: Normal rate and regular rhythm.     Pulses: Normal pulses.     Heart sounds: Normal heart sounds. No murmur.  Pulmonary:     Effort: Pulmonary effort is normal.     Breath sounds: Normal breath sounds.  Abdominal:     General: Bowel sounds are normal.     Palpations: Abdomen is soft.  Lymphadenopathy:     Cervical: No cervical adenopathy.  Skin:    General: Skin is warm.     Capillary Refill: Capillary refill takes less than 2 seconds.     Findings: Lesion (2 well-healed punctate lesions to distal aspect of middle finger of the left hand, well-healed without evidence of infection, discharge, drainage, or bleeding; erythema and tenderness to palpation are appreciated, no well-defined or demarcated erythematous) present.  Neurological:     General: No focal deficit present.     Mental Status: She is alert.     Cranial Nerves: No cranial nerve deficit.    ED Treatments / Results  Labs (all labs ordered are listed, but only abnormal results are displayed) Labs Reviewed - No data to display  EKG None  Radiology No results found.  Procedures Procedures (including critical care time)  Medications Ordered in ED Medications - No data to display   Initial Impression / Assessment and Plan / ED Course  I have reviewed the triage vital signs and the nursing notes.  Pertinent labs & imaging results that were available during my care of the patient were reviewed by me and considered in my medical  decision making (see chart for details).  Cat Bite: Patient bitten by a stray cat in her neighborhood that she has been taking care of and feeding.  By park today is without any evidence of infection, drainage, or bleeding, but is more inflamed, erythematous, and tender to touch.  Vital signs stable. -Patient encouraged to follow with PCP within the next week -Patient prescribed amoxicillin 875-125 mg 2 times daily for the next 7 days -Patient instructed to keep the bite clean with soap and water, cover with a bandage and Neosporin -Patient given detailed instructions regarding decision to vaccinate against rabies versus contacting  animal control to capture the animal and observe it for any discouraging signs of rabies infection  Final Clinical Impressions(s) / ED Diagnoses   Final diagnoses:  None    ED Discharge Orders    None     Milus Banister, [place], PGY-2 09/21/2018 9:15 AM    Daisy Floro, DO 09/21/18 [id number]    Elnora Morrison, MD 09/23/18 [phone]

## 2018-09-21 NOTE — Discharge Instructions (Addendum)
You have been prescribed Augmentin, which is an antibiotic commonly used to fight off any bacterial infections from cat bites.  Please take this medication twice daily for the next 7 days.  This medication may cause some stomach upset or diarrhea, in which case it might be better to take it with food.  Please take this medication for the entire course (all 14 tablets), even if your symptoms resolve and you feel better. Please follow-up with your primary care physician within the next week to have your wound looked at.  If the wound starts to change, appears more infectious or forms of abscess, please be reevaluated and retreated.  Your physician may decide to stop your treatment early or to prolong your treatment.  Either way, it is vital that you follow-up with them within the next week. Keep the bite clean with soap and water.  Keep it covered with a bandage and Neosporin. With any bite mark comes a risk of being exposed to rabies.  As the cat was not having any unusual behaviors, my personal suspicion for rabies in this animal is low.  However, it is wise to contact animal control to have them pick up the animal for it to be quarantined and observed for any unusual behaviors.  If at any point you feel unsure, please come back to the hospital to be vaccinated for rabies.  You can take Tylenol 500 mg every 6 hours as needed for pain control.  I hope you feel better soon.  It was a pleasure to take care of you today!  Milus Banister, [place], PGY-2 09/21/2018 9:23 AM

## 2019-03-01 ENCOUNTER — Other Ambulatory Visit (HOSPITAL_COMMUNITY): Payer: Self-pay | Admitting: Family Medicine

## 2019-03-01 DIAGNOSIS — Z13820 Encounter for screening for osteoporosis: Secondary | ICD-10-CM

## 2019-03-01 DIAGNOSIS — Z1231 Encounter for screening mammogram for malignant neoplasm of breast: Secondary | ICD-10-CM

## 2019-03-12 ENCOUNTER — Ambulatory Visit (HOSPITAL_COMMUNITY): Payer: 59

## 2019-03-12 ENCOUNTER — Ambulatory Visit (INDEPENDENT_AMBULATORY_CARE_PROVIDER_SITE_OTHER): Payer: 59 | Admitting: Orthopedic Surgery

## 2019-03-12 ENCOUNTER — Other Ambulatory Visit: Payer: Self-pay

## 2019-03-12 ENCOUNTER — Encounter: Payer: Self-pay | Admitting: Orthopedic Surgery

## 2019-03-12 VITALS — BP 138/93 | HR 97 | Ht 63.0 in | Wt 180.0 lb

## 2019-03-12 DIAGNOSIS — R9431 Abnormal electrocardiogram [ECG] [EKG]: Secondary | ICD-10-CM | POA: Insufficient documentation

## 2019-03-12 DIAGNOSIS — M25432 Effusion, left wrist: Secondary | ICD-10-CM | POA: Diagnosis not present

## 2019-03-12 DIAGNOSIS — I10 Essential (primary) hypertension: Secondary | ICD-10-CM | POA: Insufficient documentation

## 2019-03-12 DIAGNOSIS — R079 Chest pain, unspecified: Secondary | ICD-10-CM | POA: Insufficient documentation

## 2019-03-12 DIAGNOSIS — E669 Obesity, unspecified: Secondary | ICD-10-CM | POA: Insufficient documentation

## 2019-03-12 DIAGNOSIS — M2550 Pain in unspecified joint: Secondary | ICD-10-CM | POA: Diagnosis not present

## 2019-03-12 MED ORDER — MELOXICAM 7.5 MG PO TABS: 7.5000 mg | ORAL_TABLET | Freq: Every day | ORAL | 5 refills | Status: DC

## 2019-03-12 MED ORDER — PREDNISONE 10 MG PO TABS: 10.0000 mg | ORAL_TABLET | Freq: Three times a day (TID) | ORAL | 0 refills | Status: DC

## 2019-03-12 NOTE — Progress Notes (Signed)
Tammy Jensen  03/12/2019  Body mass index is 31.89 kg/m.   HISTORY SECTION :  Chief Complaint  Patient presents with  . Wrist Pain    right greater than left hand pain    HPI The patient presents for evaluation of  (mild/moderate/severe/ ) right wrist pain and swelling with numbness and tingling in the hand when it was swollen, this was relieved by dose of prednisone.  She is relatively asymptomatic at this point.  She does note a history of rheumatoid arthritis in her family although she has not been diagnosed with that  She has some mild pain in the left hand and wrist area as well    Review of Systems  HENT: Positive for tinnitus.   Cardiovascular: Positive for palpitations.  Musculoskeletal: Positive for back pain and joint pain.  Skin: Positive for itching.  Endo/Heme/Allergies: Positive for environmental allergies.  All other systems reviewed and are negative.    has a past medical history of Hypertension and IBS (irritable bowel syndrome).   Past Surgical History:  Procedure Laterality Date  . COLONOSCOPY N/A 10/07/2016   Procedure: COLONOSCOPY;  Surgeon: Rogene Houston, MD;  [facility];  Service: Endoscopy;  Laterality: N/A;  730  . TUBAL LIGATION      Body mass index is 31.89 kg/m.   No Known Allergies   Current Outpatient Medications:  .  amLODipine (NORVASC) 5 MG tablet, Take 5 mg by mouth daily., Disp: , Rfl:  .  aspirin 81 MG tablet, Take 81 mg by mouth daily., Disp: , Rfl:  .  benazepril (LOTENSIN) 40 MG tablet, Take 40 mg by mouth daily., Disp: , Rfl:  .  Cholecalciferol (D 2000) 2000 units TABS, Take 2,000 Units by mouth daily., Disp: , Rfl:  .  cyanocobalamin (,VITAMIN B-12,) 1000 MCG/ML injection, Inject 1,000 mcg into the muscle every 30 (thirty) days. Due 10-06-11, Disp: , Rfl:  .  fexofenadine (ALLEGRA) 60 MG tablet, Take 60 mg by mouth 2 (two) times daily as needed for allergies or rhinitis., Disp: , Rfl:  .  furosemide (LASIX)  20 MG tablet, Take 20 mg by mouth daily., Disp: , Rfl:  .  HYDROcodone-acetaminophen (NORCO) 5-325 MG tablet, Take 2 tablets by mouth every 4 (four) hours as needed for moderate pain., Disp: 15 tablet, Rfl: 0 .  ibuprofen (ADVIL,MOTRIN) 600 MG tablet, Take 1 tablet (600 mg total) by mouth every 6 (six) hours as needed., Disp: 30 tablet, Rfl: 0 .  naproxen (NAPROSYN) 500 MG tablet, Take 500 mg by mouth 2 (two) times daily as needed for mild pain., Disp: , Rfl:  .  Omega-3 Fatty Acids (FISH OIL) 1200 MG CAPS, Take 1,200 mg by mouth daily., Disp: , Rfl:  .  triamcinolone cream (KENALOG) 0.1 %, Apply 1 application topically daily as needed (rash)., Disp: , Rfl:  .  DEXILANT 30 MG capsule, Take 30 mg by mouth daily as needed for indigestion., Disp: , Rfl: 0 .  hydroxypropyl methylcellulose / hypromellose (ISOPTO TEARS / GONIOVISC) 2.5 % ophthalmic solution, Place 1 drop into both eyes as needed for dry eyes., Disp: , Rfl:  .  meloxicam (MOBIC) 7.5 MG tablet, Take 1 tablet (7.5 mg total) by mouth daily., Disp: 30 tablet, Rfl: 5 .  pravastatin (PRAVACHOL) 40 MG tablet, Take 40 mg by mouth every evening., Disp: , Rfl:  .  predniSONE (DELTASONE) 10 MG tablet, Take 1 tablet (10 mg total) by mouth 3 (three) times daily., Disp: 42 tablet,  Rfl: 0   PHYSICAL EXAM SECTION: 1) BP (!) 138/93   Pulse 97   Ht 5\' 3"  (1.6 m)   Wt 180 lb (81.6 kg)   BMI 31.89 kg/m   Body mass index is 31.89 kg/m. General appearance: Well-developed well-nourished no gross deformities  2) Cardiovascular normal pulse and perfusion , normal color   3) Neurologically deep tendon reflexes are equal and normal, no sensation loss or deficits no pathologic reflexes  4) Psychological: Awake alert and oriented x3 mood and affect normal  5) Skin no lacerations or ulcerations no nodularity no palpable masses, no erythema or nodularity  6) Musculoskeletal:   Two-point discrimination was normal, sharp touch was normal soft touch was  normal, she had tenderness over the carpal tunnel and tendons in the FCR and FCU area with a negative carpal tunnel Phalen's test normal grip some swelling at the MTP joints none in the IP joints   MEDICAL DECISION MAKING  A.  Encounter Diagnoses  Name Primary?  . Multiple joint pain Yes  . Wrist swelling, left     B. DATA ANALYSED:  IMAGING: Independent interpretation of images: Yes, 3 views right wrist normal alignment osteopenia no joint space narrowing or erosions at the joint margins  Orders: We ordered rheumatoid arthritis testing  Outside records reviewed: Yes [facility] sent over records indicating that the patient had some right wrist pain they thought it might be carpal tunnel and recommended referral for possible injection.  Patient is on vitamin D3 she takes furosemide amlodipine calcium aspirin  C. MANAGEMENT  Order rheumatoid arthritis markers to see if that is a possible cause  Start NSAIDs as noted below  If she has swelling again she has a standing dose of prednisone that she can take  [facility] will do her labs and she will call us the day after or day of and then let us get her an appointment for 1 to 2 days after we get the laboratory studies back  Meds ordered this encounter  Medications  . meloxicam (MOBIC) 7.5 MG tablet    Sig: Take 1 tablet (7.5 mg total) by mouth daily.    Dispense:  30 tablet    Refill:  5  . predniSONE (DELTASONE) 10 MG tablet    Sig: Take 1 tablet (10 mg total) by mouth 3 (three) times daily.    Dispense:  42 tablet    Refill:  0    Acute (less than or equal to 4 weeks) uncomplicated illness independent, x-ray prescription  Arther Abbott, MD  03/12/2019 10:01 AM

## 2019-03-12 NOTE — Patient Instructions (Addendum)
Start nsaid   If the swelling starts start the prednisone and stop the nsaid  [facility] for Rh A tests  Rheumatoid Arthritis Rheumatoid arthritis (RA) is a long-term (chronic) disease. RA causes inflammation in your joints. Your joints may feel painful, stiff, swollen, and warm. RA may start slowly. It most often affects the small joints of the hands and feet. It can also affect other parts of the body. Symptoms of RA often come and go. There is no cure for RA, but medicines can help your symptoms. What are the causes?  RA is an autoimmune disease. This means that your body's defense system (immune system) attacks healthy parts of your body by mistake. The exact cause of RA is not known. What increases the risk?  Being a woman.  Having a family history of RA or other diseases like RA.  Smoking.  Being overweight.  Being exposed to pollutants or chemicals. What are the signs or symptoms?  Morning stiffness that lasts longer than 30 minutes. This is often the first symptom.  Symptoms start slowly. They are often worse in the morning.  As RA gets worse, symptoms may include: ? Pain, stiffness, swelling, warmth, and tenderness in joints on both sides of your body. ? Loss of energy. ? Not feeling hungry. ? Weight loss. ? A low fever. ? Dry eyes and a dry mouth. ? Firm lumps that grow under your skin. ? Changes in the way your joints look. ? Changes in the way your joints work.  Symptoms vary and they: ? Often come and go. ? Sometimes get worse for a period of time. These are called flares. How is this treated?   Treatment may include: ? Taking good care of yourself. Be sure to rest as needed, eat a healthy diet, and exercise. ? Medicines. These may include:  Pain relievers.  Medicines to help with inflammation.  Disease-modifying antirheumatic drugs (DMARDs).  Medicines called biologic response modifiers. ? Physical therapy and occupational therapy. ? Surgery, if  joint damage is very bad. Your doctor will work with you to find the best treatments. Follow these instructions at home: Activity  Return to your normal activities as told by your doctor. Ask your doctor what activities are safe for you.  Rest when you have a flare.  Exercise as told by your doctor. General instructions  Take over-the-counter and prescription medicines only as told by your doctor.  Keep all follow-up visits as told by your doctor. This is important. Where to find more information  [employer]: www.rheumatology.[place]: www.arthritis.org Contact a doctor if:  You have a flare.  You have a fever.  You have problems because of your medicines. Get help right away if:  You have chest pain.  You have trouble breathing.  You get a hot, painful joint all of a sudden, and it is worse than your normal joint aches. Summary  RA is a long-term disease.  Symptoms of RA start slowly. They are often worse in the morning.  RA causes inflammation in your joints. This information is not intended to replace advice given to you by your health care provider. Make sure you discuss any questions you have with your health care provider. Document Revised: 08/24/2017 Document Reviewed: 08/24/2017 Elsevier Patient Education  2020 [employer].

## 2019-03-19 ENCOUNTER — Other Ambulatory Visit: Payer: Self-pay

## 2019-03-19 ENCOUNTER — Ambulatory Visit (HOSPITAL_COMMUNITY)
Admission: RE | Admit: 2019-03-19 | Discharge: 2019-03-19 | Disposition: A | Discharge status: Home / Self Care | Payer: 59 | Source: Ambulatory Visit | Attending: Family Medicine | Admitting: Family Medicine

## 2019-03-19 DIAGNOSIS — Z780 Asymptomatic menopausal state: Secondary | ICD-10-CM | POA: Insufficient documentation

## 2019-03-19 DIAGNOSIS — Z13820 Encounter for screening for osteoporosis: Secondary | ICD-10-CM | POA: Insufficient documentation

## 2019-03-19 DIAGNOSIS — Z1231 Encounter for screening mammogram for malignant neoplasm of breast: Secondary | ICD-10-CM | POA: Insufficient documentation

## 2019-03-21 ENCOUNTER — Inpatient Hospital Stay
Admission: RE | Admit: 2019-03-21 | Discharge: 2019-03-21 | Disposition: A | Discharge status: Home / Self Care | Payer: Self-pay | Source: Ambulatory Visit | Attending: Family Medicine | Admitting: Family Medicine

## 2019-03-21 ENCOUNTER — Other Ambulatory Visit (HOSPITAL_COMMUNITY): Payer: Self-pay | Admitting: Family Medicine

## 2019-03-21 DIAGNOSIS — Z1231 Encounter for screening mammogram for malignant neoplasm of breast: Secondary | ICD-10-CM

## 2019-04-13 ENCOUNTER — Ambulatory Visit: Payer: 59 | Admitting: Orthopedic Surgery

## 2019-04-27 ENCOUNTER — Ambulatory Visit (INDEPENDENT_AMBULATORY_CARE_PROVIDER_SITE_OTHER): Payer: 59 | Admitting: Orthopedic Surgery

## 2019-04-27 ENCOUNTER — Encounter: Payer: Self-pay | Admitting: Orthopedic Surgery

## 2019-04-27 ENCOUNTER — Other Ambulatory Visit: Payer: Self-pay

## 2019-04-27 VITALS — BP 140/90 | HR 62 | Ht 63.0 in | Wt 189.0 lb

## 2019-04-27 DIAGNOSIS — M2550 Pain in unspecified joint: Secondary | ICD-10-CM | POA: Diagnosis not present

## 2019-04-27 DIAGNOSIS — R768 Other specified abnormal immunological findings in serum: Secondary | ICD-10-CM | POA: Diagnosis not present

## 2019-04-27 DIAGNOSIS — M25432 Effusion, left wrist: Secondary | ICD-10-CM

## 2019-04-27 MED ORDER — PREDNISONE 10 MG PO TABS: 10.0000 mg | ORAL_TABLET | Freq: Three times a day (TID) | ORAL | 1 refills | Status: DC

## 2019-04-27 NOTE — Progress Notes (Signed)
No chief complaint on file.   Encounter Diagnoses  Name Primary?  . Multiple joint pain Yes  . Wrist swelling, left     Prior visit  Chief Complaint  Patient presents with  . Wrist Pain      right greater than left hand pain     HPI The patient presents for evaluation of  (mild/moderate/severe/ ) right wrist pain and swelling with numbness and tingling in the hand when it was swollen, this was relieved by dose of prednisone.  She is relatively asymptomatic at this point.  She does note a history of rheumatoid arthritis in her family although she has not been diagnosed with that   She has some mild pain in the left hand and wrist area as well  ________________________________________________________________________  Lab reports dated 3/10 show normal 1.white count  2. sed rate of 21 and  3. 24 on the rheumatoid factor  The numbness and tingling seems to have been a one-time thing but the pain in the hand had recurred and it was relieved by prednisone  Recommend f/u with rheumatology   Take prednisone as needed fracture was 21 white count was normal  Meds ordered this encounter  Medications  . predniSONE (DELTASONE) 10 MG tablet    Sig: Take 1 tablet (10 mg total) by mouth 3 (three) times daily.    Dispense:  42 tablet    Refill:  1

## 2019-04-27 NOTE — Patient Instructions (Signed)
Take prednisone as needed   We will schedule appt with Rheumatologist

## 2019-04-30 ENCOUNTER — Telehealth: Payer: Self-pay | Admitting: Rheumatology

## 2019-05-03 NOTE — Telephone Encounter (Signed)
Opened in error

## 2019-05-16 ENCOUNTER — Telehealth: Payer: Self-pay | Admitting: Orthopedic Surgery

## 2019-05-16 NOTE — Telephone Encounter (Signed)
Give her the names of the hand surgeons in [place] to make an appointment herself

## 2019-05-16 NOTE — Telephone Encounter (Signed)
Patient called to request appointment for "new problem" of left hand/wrist pain which she said is increasing at night. States her referral appointment, per Dr Aline Brochure, with Dr Estanislado Pandy is not until August. Upon reviewing office notes from patient's visit 04/27/19, Dr Aline Brochure addressed the left hand/wrist problem at that time.  Please advise if recommendation or if another appointment is needed. I relayed we can schedule week of 06/11/19 if so.

## 2019-05-16 NOTE — Telephone Encounter (Signed)
Called and gave her number for [facility] and also for [facility], these are the hand surgeons in [place].

## 2019-07-31 NOTE — Progress Notes (Signed)
Office Visit Note  Patient: Tammy Jensen             Date of Birth: 06-Dec-1950           MRN: [id number]             PCP: Alfonse Flavors, MD Referring: Carole Civil, MD Visit Date: 08/14/2019 Occupation: _0 @  Subjective:  Pain and swelling in multiple joints.   History of Present Illness: Tammy Jensen is a 69 y.o. female seen in consultation per request of Dr. Aline Brochure.  Tammy Jensen is left-handed female who states that her symptoms a started about 1 year ago with left shoulder joint pain.  She states she went to the emergency room in [place] and had x-rays of her shoulder.  She was told that she had inflammation.  She states shortly after she started having pain in her elbows and her bilateral wrists and bilateral hands.  Her hands were swollen.  She was referred to Dr. Aline Brochure who did x-rays and started her on meloxicam which did not help her.  She was given a prednisone taper and February 2021 which was helpful but then the symptoms recurred and she was given another prednisone taper in April 2021.  She has been taking anti-inflammatory since then.  She reports discomfort in her lower back, left shoulder, bilateral elbows, bilateral wrists, bilateral hands, bilateral hip joints, bilateral knee joints bilateral ankles and bilateral feet.  She notices swelling in her both hands.  She also notices color change in her feet.  She states in December 2019 she tripped and injured her right ankle which is better now.  There is no family history of autoimmune disease.  She is gravida 3, para 3, miscarriages 0.  She was recently diagnosed with osteoporosis and has been on Fosamax since May 2021.  She is tolerating Fosamax without any problems.  Activities of Daily Living:  Patient reports morning stiffness for 30  minutes.   Patient Reports nocturnal pain.  Difficulty dressing/grooming: Denies Difficulty climbing stairs: Denies Difficulty getting out of chair:  Denies Difficulty using hands for taps, buttons, cutlery, and/or writing: Denies  Review of Systems  Constitutional: Negative for fatigue, night sweats, weight gain and weight loss.  HENT: Negative for mouth sores, trouble swallowing, trouble swallowing, mouth dryness and nose dryness.   Eyes: Negative for pain, redness, itching, visual disturbance and dryness.  Respiratory: Negative for cough, shortness of breath and difficulty breathing.   Cardiovascular: Negative for chest pain, palpitations, hypertension, irregular heartbeat and swelling in legs/feet.  Gastrointestinal: Negative for blood in stool, constipation and diarrhea.  Endocrine: Negative for increased urination.  Genitourinary: Negative for difficulty urinating and vaginal dryness.  Musculoskeletal: Positive for arthralgias, joint pain, joint swelling, myalgias, morning stiffness, muscle tenderness and myalgias. Negative for muscle weakness.  Skin: Positive for color change. Negative for rash, hair loss, redness, skin tightness, ulcers and sensitivity to sunlight.  Allergic/Immunologic: Negative for susceptible to infections.  Neurological: Positive for memory loss. Negative for dizziness, numbness, headaches, night sweats and weakness.  Hematological: Positive for bruising/bleeding tendency. Negative for swollen glands.  Psychiatric/Behavioral: Positive for sleep disturbance. Negative for depressed mood and confusion. The patient is not nervous/anxious.     PMFS History:  Patient Active Problem List   Diagnosis Date Noted  . Dyslipidemia 08/14/2019  . Abnormal electrocardiogram 03/12/2019  . Chest pain 03/12/2019  . HTN (hypertension) 03/12/2019  . Obesity 03/12/2019  . Family history of colon cancer 05/28/2016    Past Medical  History:  Diagnosis Date  . Hypertension   . IBS (irritable bowel syndrome)     Family History  Problem Relation Age of Onset  . Colon cancer Father   . Colon cancer Sister   . Heart disease  Mother   . Dementia Mother   . Arthritis Mother   . Hemachromatosis Son   . Hemachromatosis Son    Past Surgical History:  Procedure Laterality Date  . COLONOSCOPY N/A 10/07/2016   Procedure: COLONOSCOPY;  Surgeon: Rogene Houston, MD;  Location: [facility];  Service: Endoscopy;  Laterality: N/A;  730  . TUBAL LIGATION     Social History   Social History Narrative  . Not on file    There is no immunization history on file for this patient.   Objective: Vital Signs: BP (!) 150/95 (BP Location: Right Arm, Patient Position: Sitting, Cuff Size: Normal)   Pulse 62   Resp 15   Ht _0  (1.6 m)   Wt 187 lb 12.8 oz (85.2 kg)   BMI 33.27 kg/m    Physical Exam Vitals and nursing note reviewed.  Constitutional:      Appearance: She is well-developed.  HENT:     Head: Normocephalic and atraumatic.  Eyes:     Conjunctiva/sclera: Conjunctivae normal.  Cardiovascular:     Rate and Rhythm: Normal rate and regular rhythm.     Heart sounds: Normal heart sounds.  Pulmonary:     Effort: Pulmonary effort is normal.     Breath sounds: Normal breath sounds.  Abdominal:     General: Bowel sounds are normal.     Palpations: Abdomen is soft.  Musculoskeletal:     Cervical back: Normal range of motion.  Lymphadenopathy:     Cervical: No cervical adenopathy.  Skin:    General: Skin is warm and dry.     Capillary Refill: Capillary refill takes less than 2 seconds.  Neurological:     Mental Status: She is alert and oriented to person, place, and time.  Psychiatric:        Behavior: Behavior normal.      Musculoskeletal Exam: C-spine was in good range of motion.  She is some discomfort range of motion of the lumbar spine.  She had discomfort range of motion of her left shoulder joint.  Elbow joints with good range of motion.  She has mild swelling and tenderness of her left wrist joints.  She has thickening of her right second and third MCP joint.  No synovitis was noted.  PIP and DIP  prominence was noted.  She had discomfort range of motion of bilateral trochanteric bursa.  Hip joints with good range of motion.  There was no warmth swelling or effusion in her knee joints.  She had bilateral pes cavus.  PIP and DIP thickening was noted.  No synovitis was noted.  CDAI Exam: CDAI Score: 2.8  Patient Global: 5 mm; Provider Global: 3 mm Swollen: 0 ; Tender: 2  Joint Exam 08/14/2019      Right  Left  Glenohumeral      Tender  Wrist      Tender     Investigation: No additional findings.  Imaging: XR Foot 2 Views Left  Result Date: 08/14/2019 Juxta-articular osteopenia was noted.  First MTP, PIP and DIP narrowing was noted.  Possible erosive versus cystic changes were noted in the second third and fourth MTP joints.  No intertarsal or tibiotalar joint space narrowing was noted. 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  XR Foot 2 Views Right  Result Date: 08/14/2019 PIP and DIP narrowing was noted.  No significant MTP joint narrowing was noted.  Possible cystic versus erosive changes were noted in the second, third, fourth and fifth MTPs.  No intertarsal joint space narrowing was noted.  No subtalar or tibiotalar joint space narrowing was noted.  Inferior and posterior calcaneal spurs were noted.  Juxta-articular osteopenia was noted. 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  XR Hand 2 View Left  Result Date: 08/14/2019 Juxta-articular osteopenia was noted.  Narrowing of all MCP joints was noted.  CMC PIP and DIP narrowing was noted.  No intercarpal or radiocarpal joint space narrowing was noted.  No erosive changes were noted. 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  XR Hand 2 View Right  Result Date: 08/14/2019 Juxta-articular osteopenia was noted.  Narrowing of almost all MCP joints was noted.  Severe  narrowing of second and third MCP joint with subluxation of first MCP joint was noted.  PIP and DIP narrowing was noted.  No intercarpal radiocarpal joint space narrowing was noted.  No erosive changes were noted. 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   Recent Labs: Lab Results  Component Value Date   WBC 6.9 12/03/2017   HGB 14.9 12/03/2017   PLT 235 12/03/2017   NA 137 12/03/2017   K 3.6 12/03/2017   CL 106 12/03/2017   CO2 23 12/03/2017   GLUCOSE 108 (H) 12/03/2017   BUN 14 12/03/2017   CREATININE 0.79 12/03/2017   BILITOT 0.8 12/03/2017   ALKPHOS 87 12/03/2017   AST 46 (H) 12/03/2017   ALT 43 12/03/2017   PROT 8.1 12/03/2017   ALBUMIN 4.3 12/03/2017   CALCIUM 9.0 12/03/2017   GFRAA >60 12/03/2017  March 14, 2019 CBC normal, CMP showed AST 67, ALT 56, lipid panel LDL 78, RF 24.2, ANA negative, ESR 21 normal  Speciality Comments: No specialty comments available.  Procedures:  No procedures performed Allergies: Patient has no known allergies.   Assessment / Plan:     Visit Diagnoses: Rheumatoid arthritis with rheumatoid factor of multiple sites without organ or systems involvement ([place]) -patient gives history of pain and discomfort in multiple joints involving her shoulders, elbows, wrist, hands, knee joints, ankles and feet.  She gives history of intermittent pain and swelling in her hands.  She had good response to 2 prednisone tapers.  She states currently her hands are not swollen but her left wrist joint continues to hurt which was tender on my examination today.  Her rheumatoid factor is positive.  We had detailed discussion regarding rheumatoid arthritis.  Different treatment options and their side effects were discussed at length.  Patient is hesitant to go on immunosuppressive agent with COVID-19 pandemic.  Indications side effects contraindications of hydroxychloroquine were discussed at length.  I will obtain  additional labs today.  After reviewing her x-ray findings she was willing to start on hydroxychloroquine.  Ideally she should be on more aggressive therapy.  We obtained informed consent today.  Prescription for Plaquenil was called in to start at 200 mg p.o. twice daily based on her weight.  She was advised to get eye examination as soon as possible.  Plan: Cyclic citrul peptide antibody, IgG, 14-3-3 eta Protein  Patient was counseled on the purpose, proper use, and adverse effects of hydroxychloroquine including  nausea/diarrhea, skin rash, headaches, and sun sensitivity.  Discussed importance of annual eye exams while on hydroxychloroquine to monitor to ocular toxicity and discussed importance of frequent laboratory monitoring.  Provided patient with eye exam form for baseline ophthalmologic exam.  Provided patient with educational materials on hydroxychloroquine and answered all questions.  Patient consented to hydroxychloroquine.  Will upload consent in the media tab.    Dose will be Plaquenil 200 mg p.o. twice daily.  Prescription pending lab results.   Pain in both hands -she complains of pain in bilateral hands.  She had no synovitis on examination.  She has PIP and DIP thickening.  Plan: XR Hand 2 View Right, XR Hand 2 View Left.  The x-ray findings were consistent with rheumatoid arthritis and osteoarthritis.  She has significant MCP joint narrowing.  Pain in both feet -she has high arched feet without any synovitis on examination.  Plan: XR Foot 2 Views Right, XR Foot 2 Views Left.  The x-ray findings were consistent with rheumatoid arthritis and osteoarthritis overlap.  There were both possible cystic versus erosive changes in the MTPs.  Pes cavus-she has bilateral high arch feet.  Shoes with arch support were suggested.  High risk medication use - Plan: COMPLETE METABOLIC PANEL WITH GFR, her LFTs were elevated.  She is also taking NSAIDs.  We will check CMP today.  Glucose 6 phosphate  dehydrogenase  Wrist swelling, left-she had mild tenderness and swelling on palpation of her left wrist.  Essential hypertension-her blood pressure is elevated despite being on medications.  History of IBS-currently not symptomatic.  Dyslipidemia-she is on statins.  Seasonal allergies  Age-related osteoporosis without current pathological fracture - March 19, 2019 BMD as determined from [facility] L1-L2 is 0.763 g/cm2 with a T-Scoreof -3.3.  She was placed on Fosamax May 2021.  COVID-19-I detailed discussion with patient regarding vaccination.  She is very hesitant to get COVID-19 vaccination.  Increased risk of complications from [id number] in patients with rheumatoid arthritis was discussed.  Orders: Orders Placed This Encounter  Procedures  . XR Hand 2 View Right  . XR Hand 2 View Left  . XR Foot 2 Views Right  . XR Foot 2 Views Left  . COMPLETE METABOLIC PANEL WITH GFR  . Cyclic citrul peptide antibody, IgG  . 14-3-3 eta Protein  . Glucose 6 phosphate dehydrogenase   Meds ordered this encounter  Medications  . hydroxychloroquine (PLAQUENIL) 200 MG tablet    Sig: Take 1 tablet (200 mg total) by mouth 2 (two) times daily.    Dispense:  60 tablet    Refill:  1    Follow-Up Instructions: Return for Rheumatoid arthritis.   Bo Merino, MD  Note - This record has been created using Editor, commissioning.  Chart creation errors have been sought, but may not always  have been located. Such creation errors do not reflect on  the standard of medical care.

## 2019-08-14 ENCOUNTER — Encounter: Payer: Self-pay | Admitting: Rheumatology

## 2019-08-14 ENCOUNTER — Encounter (INDEPENDENT_AMBULATORY_CARE_PROVIDER_SITE_OTHER): Payer: Self-pay

## 2019-08-14 ENCOUNTER — Ambulatory Visit (INDEPENDENT_AMBULATORY_CARE_PROVIDER_SITE_OTHER): Payer: 59 | Admitting: Rheumatology

## 2019-08-14 ENCOUNTER — Ambulatory Visit: Payer: Self-pay

## 2019-08-14 ENCOUNTER — Other Ambulatory Visit: Payer: Self-pay

## 2019-08-14 VITALS — BP 150/95 | HR 62 | Resp 15 | Ht 63.0 in | Wt 187.8 lb

## 2019-08-14 DIAGNOSIS — Z79899 Other long term (current) drug therapy: Secondary | ICD-10-CM

## 2019-08-14 DIAGNOSIS — M0579 Rheumatoid arthritis with rheumatoid factor of multiple sites without organ or systems involvement: Secondary | ICD-10-CM

## 2019-08-14 DIAGNOSIS — M79671 Pain in right foot: Secondary | ICD-10-CM

## 2019-08-14 DIAGNOSIS — M79642 Pain in left hand: Secondary | ICD-10-CM

## 2019-08-14 DIAGNOSIS — M79672 Pain in left foot: Secondary | ICD-10-CM | POA: Diagnosis not present

## 2019-08-14 DIAGNOSIS — Q667 Congenital pes cavus, unspecified foot: Secondary | ICD-10-CM

## 2019-08-14 DIAGNOSIS — M79641 Pain in right hand: Secondary | ICD-10-CM

## 2019-08-14 DIAGNOSIS — Z8719 Personal history of other diseases of the digestive system: Secondary | ICD-10-CM

## 2019-08-14 DIAGNOSIS — M25432 Effusion, left wrist: Secondary | ICD-10-CM

## 2019-08-14 DIAGNOSIS — M810 Age-related osteoporosis without current pathological fracture: Secondary | ICD-10-CM

## 2019-08-14 DIAGNOSIS — I10 Essential (primary) hypertension: Secondary | ICD-10-CM

## 2019-08-14 DIAGNOSIS — E785 Hyperlipidemia, unspecified: Secondary | ICD-10-CM

## 2019-08-14 DIAGNOSIS — J302 Other seasonal allergic rhinitis: Secondary | ICD-10-CM

## 2019-08-14 MED ORDER — HYDROXYCHLOROQUINE SULFATE 200 MG PO TABS: 200.0000 mg | ORAL_TABLET | Freq: Two times a day (BID) | ORAL | 1 refills | Status: DC

## 2019-08-14 NOTE — Patient Instructions (Signed)
COVID-19 vaccine recommendations:   COVID-19 vaccine is recommended for everyone (unless you are allergic to a vaccine component), even if you are on a medication that suppresses your immune system.   If you are on Methotrexate, Cellcept (mycophenolate), Rinvoq, Morrie Sheldon, and Olumiant- hold the medication for 1 week after each vaccine. Hold Methotrexate for 2 weeks after the single dose COVID-19 vaccine.   If you are on Orencia subcutaneous injection - hold medication one week prior to and one week after the first COVID-19 vaccine dose (only).   If you are on Orencia IV infusions- time vaccination administration so that the first COVID-19 vaccination will occur four weeks after the infusion and postpone the subsequent infusion by one week.   If you are on Cyclophosphamide or Rituxan infusions please contact your doctor prior to receiving the COVID-19 vaccine.   Do not take Tylenol or ant anti-inflammatory medications (NSAIDs) 24 hours prior to the COVID-19 vaccination.   There is no direct evidence about the efficacy of the COVID-19 vaccine in individuals who are on medications that suppress the immune system.   Even if you are fully vaccinated, and you are on any medications that suppress your immune system, please continue to wear a mask, maintain at least six feet social distance and practice hand hygiene.   If you develop a COVID-19 infection, please contact your PCP or our office to determine if you need antibody infusion.  We anticipate that a booster vaccine will be available soon for immunosuppressed individuals. Please cal our office before receiving your booster dose to make adjustments to your medication regimen.    Hydroxychloroquine tablets What is this medicine? HYDROXYCHLOROQUINE (hye drox ee KLOR oh kwin) is used to treat rheumatoid arthritis and systemic lupus erythematosus. It is also used to treat malaria. This medicine may be used for other purposes; ask your health care  provider or pharmacist if you have questions. COMMON BRAND NAME(S): Plaquenil, Quineprox What should I tell my health care provider before I take this medicine? They need to know if you have any of these conditions:  diabetes  eye disease, vision problems  G6PD deficiency  heart disease  history of irregular heartbeat  if you often drink alcohol  kidney disease  liver disease  porphyria  psoriasis  an unusual or allergic reaction to chloroquine, hydroxychloroquine, other medicines, foods, dyes, or preservatives  pregnant or trying to get pregnant  breast-feeding How should I use this medicine? Take this medicine by mouth with a glass of water. Follow the directions on the prescription label. Do not cut, crush or chew this medicine. Swallow the tablets whole. Take this medicine with food. Avoid taking antacids within 4 hours of taking this medicine. It is best to separate these medicines by at least 4 hours. Take your medicine at regular intervals. Do not take it more often than directed. Take all of your medicine as directed even if you think you are better. Do not skip doses or stop your medicine early. Talk to your pediatrician regarding the use of this medicine in children. While this drug may be prescribed for selected conditions, precautions do apply. Overdosage: If you think you have taken too much of this medicine contact a poison control center or emergency room at once. NOTE: This medicine is only for you. Do not share this medicine with others. What if I miss a dose? If you miss a dose, take it as soon as you can. If it is almost time for your next dose,  take only that dose. Do not take double or extra doses. What may interact with this medicine? Do not take this medicine with any of the following medications:  cisapride  dronedarone  pimozide  thioridazine This medicine may also interact with the following  medications:  ampicillin  antacids  cimetidine  cyclosporine  digoxin  kaolin  medicines for diabetes, like insulin, glipizide, glyburide  medicines for seizures like carbamazepine, phenobarbital, phenytoin  mefloquine  methotrexate  other medicines that prolong the QT interval (cause an abnormal heart rhythm)  praziquantel This list may not describe all possible interactions. Give your health care provider a list of all the medicines, herbs, non-prescription drugs, or dietary supplements you use. Also tell them if you smoke, drink alcohol, or use illegal drugs. Some items may interact with your medicine. What should I watch for while using this medicine? Visit your health care professional for regular checks on your progress. Tell your health care professional if your symptoms do not start to get better or if they get worse. You may need blood work done while you are taking this medicine. If you take other medicines that can affect heart rhythm, you may need more testing. Talk to your health care professional if you have questions. Your vision may be tested before and during use of this medicine. Tell your health care professional right away if you have any change in your eyesight. What side effects may I notice from receiving this medicine? Side effects that you should report to your doctor or health care professional as soon as possible:  allergic reactions like skin rash, itching or hives, swelling of the face, lips, or tongue  changes in vision  decreased hearing or ringing of the ears  muscle weakness  redness, blistering, peeling or loosening of the skin, including inside the mouth  sensitivity to light  signs and symptoms of a dangerous change in heartbeat or heart rhythm like chest pain; dizziness; fast or irregular heartbeat; palpitations; feeling faint or lightheaded, falls; breathing problems  signs and symptoms of liver injury like dark yellow or brown  urine; general ill feeling or flu-like symptoms; light-colored stools; loss of appetite; nausea; right upper belly pain; unusually weak or tired; yellowing of the eyes or skin  signs and symptoms of low blood sugar such as feeling anxious; confusion; dizziness; increased hunger; unusually weak or tired; sweating; shakiness; cold; irritable; headache; blurred vision; fast heartbeat; loss of consciousness  suicidal thoughts  uncontrollable head, mouth, neck, arm, or leg movements Side effects that usually do not require medical attention (report to your doctor or health care professional if they continue or are bothersome):  diarrhea  dizziness  hair loss  headache  irritable  loss of appetite  nausea, vomiting  stomach pain This list may not describe all possible side effects. Call your doctor for medical advice about side effects. You may report side effects to FDA at 1-800-FDA-1088. Where should I keep my medicine? Keep out of the reach of children. Store at room temperature between 15 and 30 degrees C (59 and 86 degrees F). Protect from moisture and light. Throw away any unused medicine after the expiration date. NOTE: This sheet is a summary. It may not cover all possible information. If you have questions about this medicine, talk to your doctor, pharmacist, or health care provider.  2020 Elsevier/Gold Standard (2018-05-01 12:56:32) Rheumatoid Arthritis Rheumatoid arthritis (RA) is a long-term (chronic) disease. RA causes inflammation in your joints. Your joints may feel painful, stiff,  swollen, and warm. RA may start slowly. It most often affects the small joints of the hands and feet. It can also affect other parts of the body. Symptoms of RA often come and go. There is no cure for RA, but medicines can help your symptoms. What are the causes?  RA is an autoimmune disease. This means that your body's defense system (immune system) attacks healthy parts of your body by  mistake. The exact cause of RA is not known. What increases the risk?  Being a woman.  Having a family history of RA or other diseases like RA.  Smoking.  Being overweight.  Being exposed to pollutants or chemicals. What are the signs or symptoms?  Morning stiffness that lasts longer than 30 minutes. This is often the first symptom.  Symptoms start slowly. They are often worse in the morning.  As RA gets worse, symptoms may include: ? Pain, stiffness, swelling, warmth, and tenderness in joints on both sides of your body. ? Loss of energy. ? Not feeling hungry. ? Weight loss. ? A low fever. ? Dry eyes and a dry mouth. ? Firm lumps that grow under your skin. ? Changes in the way your joints look. ? Changes in the way your joints work.  Symptoms vary and they: ? Often come and go. ? Sometimes get worse for a period of time. These are called flares. How is this treated?   Treatment may include: ? Taking good care of yourself. Be sure to rest as needed, eat a healthy diet, and exercise. ? Medicines. These may include:  Pain relievers.  Medicines to help with inflammation.  Disease-modifying antirheumatic drugs (DMARDs).  Medicines called biologic response modifiers. ? Physical therapy and occupational therapy. ? Surgery, if joint damage is very bad. Your doctor will work with you to find the best treatments. Follow these instructions at home: Activity  Return to your normal activities as told by your doctor. Ask your doctor what activities are safe for you.  Rest when you have a flare.  Exercise as told by your doctor. General instructions  Take over-the-counter and prescription medicines only as told by your doctor.  Keep all follow-up visits as told by your doctor. This is important. Where to find more information  [employer] Rheumat[employer]: www.rheumatology.[employer]: www.arthritis.org Contact a doctor if:  You have a  flare.  You have a fever.  You have problems because of your medicines. Get help right away if:  You have chest pain.  You have trouble breathing.  You get a hot, painful joint all of a sudden, and it is worse than your normal joint aches. Summary  RA is a long-term disease.  Symptoms of RA start slowly. They are often worse in the morning.  RA causes inflammation in your joints. This information is not intended to replace advice given to you by your health care provider. Make sure you discuss any questions you have with your health care provider. Document Revised: 08/24/2017 Document Reviewed: 08/24/2017 Elsevier Patient Education  2020 [employer].

## 2019-08-15 NOTE — Progress Notes (Signed)
LFTs are elevated.  Most likely due to statin use.  She should avoid Naprosyn or any other NSAIDs.  Rest of the labs are still pending

## 2019-08-24 LAB — COMPLETE METABOLIC PANEL WITH GFR
AG Ratio: 1.6 (calc) (ref 1.0–2.5)
ALT: 42 U/L — ABNORMAL HIGH (ref 6–29)
AST: 37 U/L — ABNORMAL HIGH (ref 10–35)
Albumin: 4.3 g/dL (ref 3.6–5.1)
Alkaline phosphatase (APISO): 56 U/L (ref 37–153)
BUN: 21 mg/dL (ref 7–25)
CO2: 26 mmol/L (ref 20–32)
Calcium: 9.3 mg/dL (ref 8.6–10.4)
Chloride: 104 mmol/L (ref 98–110)
Creat: 0.82 mg/dL (ref 0.50–0.99)
GFR, Est African American: 85 mL/min/{1.73_m2} (ref >=60)
GFR, Est Non African American: 73 mL/min/{1.73_m2} (ref >=60)
Globulin: 2.7 g/dL (calc) (ref 1.9–3.7)
Glucose, Bld: 89 mg/dL (ref 65–99)
Potassium: 4.2 mmol/L (ref 3.5–5.3)
Sodium: 140 mmol/L (ref 135–146)
Total Bilirubin: 0.7 mg/dL (ref 0.2–1.2)
Total Protein: 7 g/dL (ref 6.1–8.1)

## 2019-08-24 LAB — GLUCOSE 6 PHOSPHATE DEHYDROGENASE: G-6PDH: 13.5 U/g Hgb (ref 7.0–20.5)

## 2019-08-24 LAB — 14-3-3 ETA PROTEIN: 14-3-3 eta Protein: <0.2 ng/mL (ref <0.2)

## 2019-08-24 LAB — CYCLIC CITRUL PEPTIDE ANTIBODY, IGG: Cyclic Citrullin Peptide Ab: >250 UNITS — ABNORMAL HIGH

## 2019-08-27 NOTE — Progress Notes (Signed)
Anti-CCP is elevated consistent with rheumatoid arthritis.  She was placed on Plaquenil at the last visit.

## 2019-08-31 NOTE — Progress Notes (Signed)
Office Visit Note  Patient: Tammy Jensen             Date of Birth: 1950-09-23           MRN: [id number]             PCP: Alfonse Flavors, MD Referring: Alfonse Flavors,* Visit Date: 09/06/2019 Occupation: @GUAROCC @  Subjective:  Medication monitoring.   History of Present Illness: Tammy Jensen is a 69 y.o. female with seropositive rheumatoid arthritis. She states she did not start hydroxychloroquine after her last visit. She is concerned about immunosuppression. She has not been vaccinated yet. She is planning to get her COVID-19 vaccine next week. She continues to have discomfort in her left shoulder, bilateral hands, bilateral feet and her trochanteric bursa. She has intermittent swelling in her hands.  Activities of Daily Living:  Patient reports morning stiffness for  0 minutes.   Patient Reports nocturnal pain.  Difficulty dressing/grooming: Denies Difficulty climbing stairs: Denies Difficulty getting out of chair: Denies Difficulty using hands for taps, buttons, cutlery, and/or writing: Denies  Review of Systems  Constitutional: Positive for fatigue.  HENT: Negative for mouth sores, mouth dryness and nose dryness.   Eyes: Negative for itching and dryness.  Respiratory: Negative for shortness of breath and difficulty breathing.   Cardiovascular: Negative for chest pain and palpitations.  Gastrointestinal: Negative for blood in stool, constipation and diarrhea.  Endocrine: Negative for increased urination.  Genitourinary: Negative for difficulty urinating.  Musculoskeletal: Positive for arthralgias, joint pain, myalgias, muscle tenderness and myalgias. Negative for joint swelling and morning stiffness.  Skin: Positive for color change. Negative for rash and redness.  Allergic/Immunologic: Negative for susceptible to infections.  Neurological: Negative for dizziness, numbness, headaches, memory loss and weakness.  Hematological: Negative for  bruising/bleeding tendency.  Psychiatric/Behavioral: Negative for confusion.    PMFS History:  Patient Active Problem List   Diagnosis Date Noted  . Rheumatoid arthritis with rheumatoid factor of multiple sites without organ or systems involvement ([place]) 09/06/2019  . High risk medication use 09/06/2019  . Elevated LFTs 09/06/2019  . Age-related osteoporosis without current pathological fracture 09/06/2019  . Dyslipidemia 08/14/2019  . Abnormal electrocardiogram 03/12/2019  . Chest pain 03/12/2019  . HTN (hypertension) 03/12/2019  . Obesity 03/12/2019  . Family history of colon cancer 05/28/2016    Past Medical History:  Diagnosis Date  . Hypertension   . IBS (irritable bowel syndrome)     Family History  Problem Relation Age of Onset  . Colon cancer Father   . Colon cancer Sister   . Heart disease Mother   . Dementia Mother   . Arthritis Mother   . Hemachromatosis Son   . Hemachromatosis Son    Past Surgical History:  Procedure Laterality Date  . COLONOSCOPY N/A 10/07/2016   Procedure: COLONOSCOPY;  Surgeon: Rogene Houston, MD;  Location: [facility];  Service: Endoscopy;  Laterality: N/A;  730  . TUBAL LIGATION     Social History   Social History Narrative  . Not on file    There is no immunization history on file for this patient.   Objective: Vital Signs: BP (!) 134/95 (BP Location: Left Arm, Patient Position: Sitting, Cuff Size: Normal)   Pulse 99   Resp 15   Ht 5\' 3"  (1.6 m)   Wt 187 lb 12.8 oz (85.2 kg)   BMI 33.27 kg/m    Physical Exam Vitals and nursing note reviewed.  Constitutional:  Appearance: She is well-developed.  HENT:     Head: Normocephalic and atraumatic.  Eyes:     Conjunctiva/sclera: Conjunctivae normal.  Cardiovascular:     Rate and Rhythm: Normal rate and regular rhythm.     Heart sounds: Normal heart sounds.  Pulmonary:     Effort: Pulmonary effort is normal.     Breath sounds: Normal breath sounds.  Abdominal:      General: Bowel sounds are normal.     Palpations: Abdomen is soft.  Musculoskeletal:     Cervical back: Normal range of motion.  Lymphadenopathy:     Cervical: No cervical adenopathy.  Skin:    General: Skin is warm and dry.     Capillary Refill: Capillary refill takes less than 2 seconds.  Neurological:     Mental Status: She is alert and oriented to person, place, and time.  Psychiatric:        Behavior: Behavior normal.      Musculoskeletal Exam: C-spine thoracic and lumbar spine were in good range of motion. She had painful range of motion of her left shoulder joint and limited abduction. Joints with good range of motion. She has tenderness on palpation of bilateral wrist joints. She has synovitis over MCPs as described below. She has warmth and swelling in her right knee joint. She had tenderness on palpation of bilateral trochanteric bursa. There was no tenderness over MTPs.  CDAI Exam: CDAI Score: 13.8  Patient Global: 3 mm; Provider Global: 5 mm Swollen: 5 ; Tender: 8  Joint Exam 09/06/2019      Right  Left  Glenohumeral      Tender  Wrist   Tender   Tender  MCP 2  Swollen Tender  Swollen Tender  MCP 3  Swollen Tender  Swollen Tender  Knee  Swollen Tender        Investigation: No additional findings.  Imaging: XR Foot 2 Views Left  Result Date: 08/14/2019 Juxta-articular osteopenia was noted.  First MTP, PIP and DIP narrowing was noted.  Possible erosive versus cystic changes were noted in the second third and fourth MTP joints.  No intertarsal or tibiotalar joint space narrowing was noted. 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  XR Foot 2 Views Right  Result Date: 08/14/2019 PIP and DIP narrowing was noted.  No significant MTP joint narrowing was noted.  Possible cystic versus erosive changes were noted in the second, third, fourth and fifth MTPs.  No intertarsal joint space narrowing was noted.  No subtalar  or tibiotalar joint space narrowing was noted.  Inferior and posterior calcaneal spurs were noted.  Juxta-articular osteopenia was noted. 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  XR Hand 2 View Left  Result Date: 08/14/2019 Juxta-articular osteopenia was noted.  Narrowing of all MCP joints was noted.  CMC PIP and DIP narrowing was noted.  No intercarpal or radiocarpal joint space narrowing was noted.  No erosive changes were noted. 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  XR Hand 2 View Right  Result Date: 08/14/2019 Juxta-articular osteopenia was noted.  Narrowing of almost all MCP joints was noted.  Severe narrowing of second and third MCP joint with subluxation of first MCP joint was noted.  PIP and DIP narrowing was noted.  No intercarpal radiocarpal joint space narrowing was noted.  No erosive changes were noted. 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  Recent Labs: Lab Results  Component Value Date   WBC 6.9 12/03/2017   HGB 14.9 12/03/2017   PLT 235 12/03/2017   NA 140 08/14/2019   K 4.2 08/14/2019   CL 104 08/14/2019   CO2 26 08/14/2019   GLUCOSE 89 08/14/2019   BUN 21 08/14/2019   CREATININE 0.82 08/14/2019   BILITOT 0.7 08/14/2019   ALKPHOS 87 12/03/2017   AST 37 (H) 08/14/2019   ALT 42 (H) 08/14/2019   PROT 7.0 08/14/2019   ALBUMIN 4.3 12/03/2017   CALCIUM 9.3 08/14/2019   GFRAA 85 08/14/2019   August 14, 2019 anti-CCP> 250, 14 3 3  eta negative, G6PD normal  Speciality Comments: No specialty comments available.  Procedures:  No procedures performed Allergies: Patient has no known allergies.   Assessment / Plan:     Visit Diagnoses: Rheumatoid arthritis with rheumatoid factor of multiple sites without organ or systems involvement ([place]) - Positive rheumatoid factor, positive anti-CCP. Patient has  ongoing synovitis in multiple joints as described above. She has not started hydroxychloroquine. She states she was concerned about the side effects of hydroxychloroquine and the pandemic. Side effects of hydroxychloroquine were reviewed again. She is convinced to start on the medication. I have advised her to get a baseline eye examination as well.  High risk medication use - Plaquenil 200 mg p.o. twice daily started on August 14, 2019.  Patient needs more aggressive therapy but she was hesitant due to the pandemic. We will hold off aggressive therapy at this point.  Elevated LFTs-patient states she always had elevated LFTs and was told that that was due to fatty liver.  Pain in both hands - Severe narrowing of MCPs, intercarpal and radiocarpal joints was noted on the x-rays. X-ray findings were discussed.  Wrist swelling, left-improved  Right knee swelling-she had warmth and swelling in her right knee joint.  Bilateral trochanteric bursitis-she had tenderness on palpation over bilateral trochanteric bursa. I would avoid cortisone injection as she is planning to get Covid 19 vaccination next week. ITB stretches were discussed.  Pain in both feet - Cystic versus erosive changes were noted at the MTPs.  Pes cavus-arch support was discussed.  Essential hypertension-her blood pressure is elevated today. She has been advised to monitor blood pressure closely  History of IBS  Dyslipidemia  Seasonal allergies  Age-related osteoporosis without current pathological fracture - March 19, 2019 BMD as determined from [facility] L1-L2 is 0.763 g/cm2 with a T-Scoreof -3.3.  She was placed on Fosamax May 2021.  COVID-19 vaccination-I detailed discussion with the patient regarding the benefits of COVID-19 vaccination. She is convinced and will get COVID-19 vaccine next week.   Orders: No orders of the defined types were placed in this encounter.  No orders of the defined types were placed in this  encounter.    Follow-Up Instructions: Return in about 3 months (around 12/06/2019) for Rheumatoid arthritis.   Bo Merino, MD  Note - This record has been created using Editor, commissioning.  Chart creation errors have been sought, but may not always  have been located. Such creation errors do not reflect on  the standard of medical care.

## 2019-09-06 ENCOUNTER — Other Ambulatory Visit: Payer: Self-pay

## 2019-09-06 ENCOUNTER — Encounter: Payer: Self-pay | Admitting: Rheumatology

## 2019-09-06 ENCOUNTER — Ambulatory Visit (INDEPENDENT_AMBULATORY_CARE_PROVIDER_SITE_OTHER): Payer: 59 | Admitting: Rheumatology

## 2019-09-06 VITALS — BP 134/95 | HR 99 | Resp 15 | Ht 63.0 in | Wt 187.8 lb

## 2019-09-06 DIAGNOSIS — J302 Other seasonal allergic rhinitis: Secondary | ICD-10-CM

## 2019-09-06 DIAGNOSIS — M79672 Pain in left foot: Secondary | ICD-10-CM

## 2019-09-06 DIAGNOSIS — M0579 Rheumatoid arthritis with rheumatoid factor of multiple sites without organ or systems involvement: Secondary | ICD-10-CM

## 2019-09-06 DIAGNOSIS — R7989 Other specified abnormal findings of blood chemistry: Secondary | ICD-10-CM | POA: Diagnosis not present

## 2019-09-06 DIAGNOSIS — M810 Age-related osteoporosis without current pathological fracture: Secondary | ICD-10-CM

## 2019-09-06 DIAGNOSIS — M7062 Trochanteric bursitis, left hip: Secondary | ICD-10-CM

## 2019-09-06 DIAGNOSIS — M7061 Trochanteric bursitis, right hip: Secondary | ICD-10-CM

## 2019-09-06 DIAGNOSIS — M79642 Pain in left hand: Secondary | ICD-10-CM

## 2019-09-06 DIAGNOSIS — E785 Hyperlipidemia, unspecified: Secondary | ICD-10-CM

## 2019-09-06 DIAGNOSIS — M79641 Pain in right hand: Secondary | ICD-10-CM

## 2019-09-06 DIAGNOSIS — Z8719 Personal history of other diseases of the digestive system: Secondary | ICD-10-CM

## 2019-09-06 DIAGNOSIS — M25432 Effusion, left wrist: Secondary | ICD-10-CM

## 2019-09-06 DIAGNOSIS — M79671 Pain in right foot: Secondary | ICD-10-CM

## 2019-09-06 DIAGNOSIS — Z79899 Other long term (current) drug therapy: Secondary | ICD-10-CM | POA: Diagnosis not present

## 2019-09-06 DIAGNOSIS — I10 Essential (primary) hypertension: Secondary | ICD-10-CM

## 2019-09-06 DIAGNOSIS — Q667 Congenital pes cavus, unspecified foot: Secondary | ICD-10-CM

## 2019-09-06 NOTE — Patient Instructions (Signed)

## 2019-11-09 ENCOUNTER — Other Ambulatory Visit: Payer: Self-pay | Admitting: Orthopedic Surgery

## 2019-11-09 DIAGNOSIS — M2550 Pain in unspecified joint: Secondary | ICD-10-CM

## 2019-11-26 NOTE — Progress Notes (Signed)
Office Visit Note  Patient: Tammy Jensen             Date of Birth: 11/08/1950           MRN: [id number]             PCP: Alfonse Flavors, MD Referring: Alfonse Flavors,* Visit Date: 12/10/2019 Occupation: @GUAROCC @  Subjective:  Medication Management   History of Present Illness: [name] is a 69 y.o. female with history of rheumatoid arthritis, and osteoarthritis.  She has been on hydroxychloroquine since August 2021.  She states she has not had any left wrist joint swelling.  Her hands have been stiff but not swollen.  She states she has had problems with lower back and hip pain off and on for the last 2 years.  Recently she has been exhibiting experiencing lot of discomfort which she points to her trochanteric bursa area and it radiates down her legs.  Activities of Daily Living:  Patient reports morning stiffness for all day.  Patient Reports nocturnal pain.  Difficulty dressing/grooming: Denies Difficulty climbing stairs: Denies Difficulty getting out of chair: Denies Difficulty using hands for taps, buttons, cutlery, and/or writing: Denies  Review of Systems  Constitutional: Negative for fatigue.  HENT: Positive for nose dryness. Negative for mouth sores and mouth dryness.   Eyes: Negative for pain, itching and dryness.  Respiratory: Negative for shortness of breath and difficulty breathing.   Cardiovascular: Negative for chest pain and palpitations.  Gastrointestinal: Positive for constipation. Negative for blood in stool and diarrhea.  Endocrine: Negative for increased urination.  Genitourinary: Negative for difficulty urinating.  Musculoskeletal: Positive for arthralgias, joint pain, myalgias, morning stiffness, muscle tenderness and myalgias. Negative for joint swelling.  Skin: Negative for color change, rash and redness.  Allergic/Immunologic: Negative for susceptible to infections.  Neurological: Positive for headaches. Negative for  dizziness, numbness, memory loss and weakness.       Sinus  Hematological: Positive for bruising/bleeding tendency.  Psychiatric/Behavioral: Positive for sleep disturbance. Negative for confusion.    PMFS History:  Patient Active Problem List   Diagnosis Date Noted  . Rheumatoid arthritis with rheumatoid factor of multiple sites without organ or systems involvement ([place]) 09/06/2019  . High risk medication use 09/06/2019  . Elevated LFTs 09/06/2019  . Age-related osteoporosis without current pathological fracture 09/06/2019  . Dyslipidemia 08/14/2019  . Abnormal electrocardiogram 03/12/2019  . Chest pain 03/12/2019  . HTN (hypertension) 03/12/2019  . Obesity 03/12/2019  . Family history of colon cancer 05/28/2016    Past Medical History:  Diagnosis Date  . Hypertension   . IBS (irritable bowel syndrome)     Family History  Problem Relation Age of Onset  . Colon cancer Father   . Colon cancer Sister   . Heart disease Mother   . Dementia Mother   . Arthritis Mother   . Hemachromatosis Son   . Hemachromatosis Son    Past Surgical History:  Procedure Laterality Date  . COLONOSCOPY N/A 10/07/2016   Procedure: COLONOSCOPY;  Surgeon: Rogene Houston, MD;  Location: [facility];  Service: Endoscopy;  Laterality: N/A;  730  . TUBAL LIGATION     Social History   Social History Narrative  . Not on file   Immunization History  Administered Date(s) Administered  . PFIZER SARS-COV-2 Vaccination 09/20/2019, 10/15/2019     Objective: Vital Signs: BP 133/87 (BP Location: Left Arm, Patient Position: Sitting, Cuff Size: Normal)   Pulse 66   Resp 15  Ht 5\' 3"  (1.6 m)   Wt 185 lb (83.9 kg)   BMI 32.77 kg/m    Physical Exam Vitals and nursing note reviewed.  Constitutional:      Appearance: She is well-developed.  HENT:     Head: Normocephalic and atraumatic.  Eyes:     Conjunctiva/sclera: Conjunctivae normal.  Cardiovascular:     Rate and Rhythm: Normal rate and  regular rhythm.     Heart sounds: Normal heart sounds.  Pulmonary:     Effort: Pulmonary effort is normal.     Breath sounds: Normal breath sounds.  Abdominal:     General: Bowel sounds are normal.     Palpations: Abdomen is soft.  Musculoskeletal:     Cervical back: Normal range of motion.  Lymphadenopathy:     Cervical: No cervical adenopathy.  Skin:    General: Skin is warm and dry.     Capillary Refill: Capillary refill takes less than 2 seconds.  Neurological:     Mental Status: She is alert and oriented to person, place, and time.  Psychiatric:        Behavior: Behavior normal.      Musculoskeletal Exam: Spine thoracic and lumbar spine with good range of motion.  Shoulder joints, elbow joints, wrist joints with good range of motion.  She has some PIP and DIP thickening.  No synovitis was noted.  Hip joints, knee joints, ankles, MTPs and PIPs with good range of motion with no synovitis.  She had tenderness on palpation of bilateral trochanteric bursa.  CDAI Exam: CDAI Score: 0.5  Patient Global: 3 mm; Provider Global: 2 mm Swollen: 0 ; Tender: 0  Joint Exam 12/10/2019   No joint exam has been documented for this visit   There is currently no information documented on the homunculus. Go to the Rheumatology activity and complete the homunculus joint exam.  Investigation: No additional findings.  Imaging: No results found.  Recent Labs: Lab Results  Component Value Date   WBC 6.9 12/03/2017   HGB 14.9 12/03/2017   PLT 235 12/03/2017   NA 140 08/14/2019   K 4.2 08/14/2019   CL 104 08/14/2019   CO2 26 08/14/2019   GLUCOSE 89 08/14/2019   BUN 21 08/14/2019   CREATININE 0.82 08/14/2019   BILITOT 0.7 08/14/2019   ALKPHOS 87 12/03/2017   AST 37 (H) 08/14/2019   ALT 42 (H) 08/14/2019   PROT 7.0 08/14/2019   ALBUMIN 4.3 12/03/2017   CALCIUM 9.3 08/14/2019   GFRAA 85 08/14/2019    Speciality Comments: No specialty comments available.  Procedures:  Large  Joint Inj: bilateral greater trochanter on 12/10/2019 10:31 AM Indications: pain Details: 27 G 1.5 in needle, lateral approach  Arthrogram: No  Medications (Right): 1.5 mL lidocaine 1 %; 40 mg triamcinolone acetonide 40 MG/ML Medications (Left): 1.5 mL lidocaine 1 %; 40 mg triamcinolone acetonide 40 MG/ML Outcome: tolerated well, no immediate complications Procedure, treatment alternatives, risks and benefits explained, specific risks discussed. Consent was given by the patient. Immediately prior to procedure a time out was called to verify the correct patient, procedure, equipment, support staff and site/side marked as required. Patient was prepped and draped in the usual sterile fashion.     Allergies: Patient has no known allergies.   Assessment / Plan:     Visit Diagnoses: Rheumatoid arthritis with rheumatoid factor of multiple sites without organ or systems involvement ([place]) - Positive rheumatoid factor, positive anti-CCP.  Patient had no synovitis on examination.  She has responded very well to hydroxychloroquine.  High risk medication use - Plaquenil 200 mg p.o. twice daily started on August 14, 2019.  Patient needs more aggressive therapy but she was hesitant due to the pandemic. -She has not had her baseline eye examination yet.  Plan: Ambulatory referral to Ophthalmology, CBC with Differential/Platelet, COMPLETE METABOLIC PANEL WITH GFR today and then in 4 months.  She is fully vaccinated against COVID-19.  She will be receiving a booster after 6 months.  Pain in both hands - Severe narrowing of MCPs, intercarpal and radiocarpal joints was noted on the x-rays.  She had no synovitis on my examination today.  Elevated LFTs-patient gives history of chronic elevation of LFTs.  She states is due to fatty liver.  She also takes NSAIDs occasionally.  Have advised to avoid NSAIDs completely.  Pes cavus-shoes with arch support were discussed.  Trochanteric bursitis of both hips-she has been  having pain and discomfort in bilateral trochanteric bursa.  She has been having difficulty walking.  She states that she has not been doing the IT band exercises which we gave her last time.  I offered physical therapy which she declined.  After informed consent was obtained bilateral trochanteric bursae were injected with cortisone as described above.  She tolerated the procedure well.  Have advised her to monitor blood pressure closely.  Age-related osteoporosis without current pathological fracture -  March 19, 2019 BMD as determined from [facility] L1-L2 is 0.763 g/cm2 with a T-Scoreof -3.3.  She was placed on Fosamax May 2021.  Essential hypertension-she is on medications.  History of IBS  Dyslipidemia  Seasonal allergies  Orders: Orders Placed This Encounter  Procedures  . Large Joint Inj  . CBC with Differential/Platelet  . COMPLETE METABOLIC PANEL WITH GFR  . Ambulatory referral to Ophthalmology   No orders of the defined types were placed in this encounter.     Follow-Up Instructions: Return in about 4 months (around 04/09/2020) for Rheumatoid arthritis, Osteoarthritis.   Bo Merino, MD  Note - This record has been created using Editor, commissioning.  Chart creation errors have been sought, but may not always  have been located. Such creation errors do not reflect on  the standard of medical care.

## 2019-12-10 ENCOUNTER — Other Ambulatory Visit: Payer: Self-pay

## 2019-12-10 ENCOUNTER — Encounter: Payer: Self-pay | Admitting: Rheumatology

## 2019-12-10 ENCOUNTER — Ambulatory Visit (INDEPENDENT_AMBULATORY_CARE_PROVIDER_SITE_OTHER): Payer: 59 | Admitting: Rheumatology

## 2019-12-10 VITALS — BP 133/87 | HR 66 | Resp 15 | Ht 63.0 in | Wt 185.0 lb

## 2019-12-10 DIAGNOSIS — Z79899 Other long term (current) drug therapy: Secondary | ICD-10-CM | POA: Diagnosis not present

## 2019-12-10 DIAGNOSIS — E785 Hyperlipidemia, unspecified: Secondary | ICD-10-CM

## 2019-12-10 DIAGNOSIS — M7061 Trochanteric bursitis, right hip: Secondary | ICD-10-CM

## 2019-12-10 DIAGNOSIS — R7989 Other specified abnormal findings of blood chemistry: Secondary | ICD-10-CM

## 2019-12-10 DIAGNOSIS — I10 Essential (primary) hypertension: Secondary | ICD-10-CM

## 2019-12-10 DIAGNOSIS — M79642 Pain in left hand: Secondary | ICD-10-CM

## 2019-12-10 DIAGNOSIS — M0579 Rheumatoid arthritis with rheumatoid factor of multiple sites without organ or systems involvement: Secondary | ICD-10-CM

## 2019-12-10 DIAGNOSIS — Z8719 Personal history of other diseases of the digestive system: Secondary | ICD-10-CM

## 2019-12-10 DIAGNOSIS — J302 Other seasonal allergic rhinitis: Secondary | ICD-10-CM

## 2019-12-10 DIAGNOSIS — M7062 Trochanteric bursitis, left hip: Secondary | ICD-10-CM

## 2019-12-10 DIAGNOSIS — Q667 Congenital pes cavus, unspecified foot: Secondary | ICD-10-CM

## 2019-12-10 DIAGNOSIS — M79641 Pain in right hand: Secondary | ICD-10-CM | POA: Diagnosis not present

## 2019-12-10 DIAGNOSIS — M810 Age-related osteoporosis without current pathological fracture: Secondary | ICD-10-CM

## 2019-12-10 DIAGNOSIS — M25432 Effusion, left wrist: Secondary | ICD-10-CM

## 2019-12-10 MED ORDER — LIDOCAINE HCL 1 % IJ SOLN
1.5000 mL | INTRAMUSCULAR | Status: AC | PRN
  Administered 2019-12-10: 1.5 mL

## 2019-12-10 MED ORDER — TRIAMCINOLONE ACETONIDE 40 MG/ML IJ SUSP
40.0000 mg | INTRAMUSCULAR | Status: AC | PRN
  Administered 2019-12-10: 40 mg via INTRA_ARTICULAR

## 2019-12-10 NOTE — Patient Instructions (Signed)
Standing Labs We placed an order today for your standing lab work.   Please have your standing labs drawn in April  If possible, please have your labs drawn 2 weeks prior to your appointment so that the provider can discuss your results at your appointment.  We have open lab daily Monday through Thursday from 8:30-12:30 PM and 1:30-4:30 PM and Friday from 8:30-12:30 PM and 1:30-4:00 PM at the office of Dr. Bo Merino, [facility].   Please be advised, patients with office appointments requiring lab work will take precedents over walk-in lab work.  If possible, please come for your lab work on Monday and Friday afternoons, as you may experience shorter wait times. The office is located at [address] [place], [place], [place] [address] No appointment is necessary.   Labs are drawn by [facility]. Please bring your co-pay at the time of your lab draw.  You may receive a bill from [facility] for your lab work.  If you wish to have your labs drawn at another location, please call the office 24 hours in advance to send orders.  If you have any questions regarding directions or hours of operation,  please call [phone].   As a reminder, please drink plenty of water prior to coming for your lab work. Thanks!  COVID-19 vaccine recommendations:   COVID-19 vaccine is recommended for everyone (unless you are allergic to a vaccine component), even if you are on a medication that suppresses your immune system.   Do not take Tylenol or any anti-inflammatory medications (NSAIDs) 24 hours prior to the COVID-19 vaccination.   There is no direct evidence about the efficacy of the COVID-19 vaccine in individuals who are on medications that suppress the immune system.   Even if you are fully vaccinated, and you are on any medications that suppress your immune system, please continue to wear a mask, maintain at least six feet social distance and practice hand hygiene.   If you develop a  COVID-19 infection, please contact your PCP or our office to determine if you need monoclonal antibody infusion.  The booster vaccine is now available for immunocompromised patients.   Please see the following web sites for updated information.   [email or web address]

## 2019-12-11 LAB — COMPLETE METABOLIC PANEL WITH GFR
AG Ratio: 1.5 (calc) (ref 1.0–2.5)
ALT: 43 U/L — ABNORMAL HIGH (ref 6–29)
AST: 60 U/L — ABNORMAL HIGH (ref 10–35)
Albumin: 4.4 g/dL (ref 3.6–5.1)
Alkaline phosphatase (APISO): 59 U/L (ref 37–153)
BUN: 25 mg/dL (ref 7–25)
CO2: 26 mmol/L (ref 20–32)
Calcium: 9.5 mg/dL (ref 8.6–10.4)
Chloride: 105 mmol/L (ref 98–110)
Creat: 0.85 mg/dL (ref 0.50–0.99)
GFR, Est African American: 81 mL/min/{1.73_m2} (ref >=60)
GFR, Est Non African American: 70 mL/min/{1.73_m2} (ref >=60)
Globulin: 2.9 g/dL (calc) (ref 1.9–3.7)
Glucose, Bld: 93 mg/dL (ref 65–139)
Potassium: 4 mmol/L (ref 3.5–5.3)
Sodium: 142 mmol/L (ref 135–146)
Total Bilirubin: 0.7 mg/dL (ref 0.2–1.2)
Total Protein: 7.3 g/dL (ref 6.1–8.1)

## 2019-12-11 LAB — CBC WITH DIFFERENTIAL/PLATELET
Absolute Monocytes: 511 cells/uL (ref 200–950)
Basophils Absolute: 89 cells/uL (ref 0–200)
Basophils Relative: 1.2 %
Eosinophils Absolute: 148 cells/uL (ref 15–500)
Eosinophils Relative: 2 %
HCT: 45 % (ref 35.0–45.0)
Hemoglobin: 14.9 g/dL (ref 11.7–15.5)
Lymphs Abs: 2198 cells/uL (ref 850–3900)
MCH: 28.5 pg (ref 27.0–33.0)
MCHC: 33.1 g/dL (ref 32.0–36.0)
MCV: 86.2 fL (ref 80.0–100.0)
MPV: 11.1 fL (ref 7.5–12.5)
Monocytes Relative: 6.9 %
Neutro Abs: 4455 cells/uL (ref 1500–7800)
Neutrophils Relative %: 60.2 %
Platelets: 269 10*3/uL (ref 140–400)
RBC: 5.22 10*6/uL — ABNORMAL HIGH (ref 3.80–5.10)
RDW: 12.6 % (ref 11.0–15.0)
Total Lymphocyte: 29.7 %
WBC: 7.4 10*3/uL (ref 3.8–10.8)

## 2019-12-11 NOTE — Progress Notes (Signed)
CBC is normal.  LFTs are elevated.  Most likely due to statin use.  Please advise patient not to take Naprosyn or any other NSAIDs.

## 2019-12-12 ENCOUNTER — Telehealth: Payer: Self-pay | Admitting: *Deleted

## 2019-12-12 DIAGNOSIS — M7061 Trochanteric bursitis, right hip: Secondary | ICD-10-CM

## 2019-12-12 NOTE — Progress Notes (Signed)
She may take Naprosyn occasionally because her liver functions are elevated.

## 2019-12-12 NOTE — Telephone Encounter (Signed)
-----   Message from Bo Merino, MD sent at 12/12/2019 12:37 PM EST ----- She may take Naprosyn occasionally because her liver functions are elevated.

## 2020-04-09 ENCOUNTER — Other Ambulatory Visit (HOSPITAL_COMMUNITY): Payer: Self-pay | Admitting: Family Medicine

## 2020-04-09 DIAGNOSIS — Z1231 Encounter for screening mammogram for malignant neoplasm of breast: Secondary | ICD-10-CM

## 2020-04-11 NOTE — Progress Notes (Signed)
Office Visit Note  Patient: Tammy Jensen             Date of Birth: Mar 08, 1950           MRN: [id number]             PCP: Alfonse Flavors, MD Referring: Alfonse Flavors,* Visit Date: 04/25/2020 Occupation: @GUAROCC @  Subjective:  Medication management   History of Present Illness: Tammy Jensen is a 70 y.o. female with history of seropositive rheumatoid arthritis, osteoarthritis.  She states she has been having increased pain and discomfort in her left shoulder.  She continues to have some discomfort in her right trochanteric bursae in her lower back.  She had an eye examination about 3 weeks ago which was normal per patient.  She has been experiencing a lot of lower back pain.  She has been getting alendronate from her PCP for the treatment of osteoporosis.  She has been tolerating all the medications well.  Activities of Daily Living:  Patient reports morning stiffness for 30 minutes.   Patient Reports nocturnal pain.  Difficulty dressing/grooming: Denies Difficulty climbing stairs: Denies Difficulty getting out of chair: Denies Difficulty using hands for taps, buttons, cutlery, and/or writing: Reports  Review of Systems  Constitutional: Negative for fatigue.  HENT: Positive for mouth dryness and nose dryness. Negative for mouth sores.   Eyes: Negative for pain, itching and dryness.  Respiratory: Positive for shortness of breath. Negative for difficulty breathing.   Cardiovascular: Negative for chest pain and palpitations.  Gastrointestinal: Negative for blood in stool, constipation and diarrhea.  Endocrine: Negative for increased urination.  Genitourinary: Negative for difficulty urinating.  Musculoskeletal: Positive for arthralgias, joint pain, joint swelling, myalgias, morning stiffness, muscle tenderness and myalgias.  Skin: Negative for color change, rash and redness.  Allergic/Immunologic: Negative for susceptible to infections.  Neurological:  Negative for dizziness, numbness, headaches, memory loss and weakness.  Hematological: Positive for bruising/bleeding tendency.  Psychiatric/Behavioral: Negative for confusion.    PMFS History:  Patient Active Problem List   Diagnosis Date Noted  . Rheumatoid arthritis with rheumatoid factor of multiple sites without organ or systems involvement ([place]) 09/06/2019  . High risk medication use 09/06/2019  . Elevated LFTs 09/06/2019  . Age-related osteoporosis without current pathological fracture 09/06/2019  . Dyslipidemia 08/14/2019  . Abnormal electrocardiogram 03/12/2019  . Chest pain 03/12/2019  . HTN (hypertension) 03/12/2019  . Obesity 03/12/2019  . Family history of colon cancer 05/28/2016    Past Medical History:  Diagnosis Date  . Hypertension   . IBS (irritable bowel syndrome)     Family History  Problem Relation Age of Onset  . Colon cancer Father   . Colon cancer Sister   . Heart disease Mother   . Dementia Mother   . Arthritis Mother   . Hemachromatosis Son   . Hemachromatosis Son    Past Surgical History:  Procedure Laterality Date  . COLONOSCOPY N/A 10/07/2016   Procedure: COLONOSCOPY;  Surgeon: Rogene Houston, MD;  Location[facility];  Service: Endoscopy;  Laterality: N/A;  730  . TUBAL LIGATION     Social History   Social History Narrative  . Not on file   Immunization History  Administered Date(s) Administered  . PFIZER(Purple Top)SARS-COV-2 Vaccination 09/20/2019, 10/15/2019     Objective: Vital Signs: BP 135/89 (BP Location: Left Arm, Patient Position: Sitting, Cuff Size: Normal)   Pulse 80   Resp 14   Ht 5\' 3"  (1.6 m)   Wt  185 lb 9.6 oz (84.2 kg)   BMI 32.88 kg/m    Physical Exam Vitals and nursing note reviewed.  Constitutional:      Appearance: She is well-developed.  HENT:     Head: Normocephalic and atraumatic.  Eyes:     Conjunctiva/sclera: Conjunctivae normal.  Cardiovascular:     Rate and Rhythm: Normal rate and regular  rhythm.     Heart sounds: Normal heart sounds.  Pulmonary:     Effort: Pulmonary effort is normal.     Breath sounds: Normal breath sounds.  Abdominal:     General: Bowel sounds are normal.     Palpations: Abdomen is soft.  Musculoskeletal:     Cervical back: Normal range of motion.  Lymphadenopathy:     Cervical: No cervical adenopathy.  Skin:    General: Skin is warm and dry.     Capillary Refill: Capillary refill takes less than 2 seconds.  Neurological:     Mental Status: She is alert and oriented to person, place, and time.  Psychiatric:        Behavior: Behavior normal.      Musculoskeletal Exam: C-spine was in good range of motion.  She has painful range of motion of her lumbar spine with no point tenderness.  She had painful range of motion of her left shoulder joint with subacromial tenderness.  Right shoulder joint was full range of motion.  Elbow joints and wrist joints with full range of motion.  She had bilateral PIP and DIP thickening with no synovitis.  Hip joints and knee joints were in full range of motion.  There was no tenderness over ankles or MTPs.  CDAI Exam: CDAI Score: 0.4  Patient Global: 2 mm; Provider Global: 2 mm Swollen: 0 ; Tender: 2  Joint Exam 04/25/2020      Right  Left  Acromioclavicular      Tender  Lumbar Spine   Tender        Investigation: No additional findings.  Imaging: XR Lumbar Spine 2-3 Views  Result Date: 04/25/2020 Levoscoliosis was noted.  No significant disc space narrowing was noted.  Facet joint arthropathy was noted. 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  XR Shoulder Left  Result Date: 04/25/2020 No glenohumeral or acromioclavicular joint space narrowing was noted.  No chondrocalcinosis was noted. Impression: Unremarkable x-ray of the shoulder joint.   Recent Labs: Lab Results  Component Value Date   WBC 7.4 12/10/2019   HGB 14.9 12/10/2019   PLT 269 12/10/2019   NA 142  12/10/2019   K 4.0 12/10/2019   CL 105 12/10/2019   CO2 26 12/10/2019   GLUCOSE 93 12/10/2019   BUN 25 12/10/2019   CREATININE 0.85 12/10/2019   BILITOT 0.7 12/10/2019   ALKPHOS 87 12/03/2017   AST 60 (H) 12/10/2019   ALT 43 (H) 12/10/2019   PROT 7.3 12/10/2019   ALBUMIN 4.3 12/03/2017   CALCIUM 9.5 12/10/2019   GFRAA 81 12/10/2019    Speciality Comments:  PLQ WNL 03/31/2020  [facility]  Procedures:  Large Joint Inj: L subacromial bursa on 04/25/2020 11:16 AM Indications: pain Details: 27 G 1.5 in needle, posterior approach  Arthrogram: No  Medications: 40 mg triamcinolone acetonide 40 MG/ML; 1.5 mL lidocaine 1 % Aspirate: 0 mL Outcome: tolerated well, no immediate complications Procedure, treatment alternatives, risks and benefits explained, specific risks discussed. Consent was given by the patient. Immediately prior to procedure a time out was called to verify  the correct patient, procedure, equipment, support staff and site/side marked as required. Patient was prepped and draped in the usual sterile fashion.     Allergies: Patient has no known allergies.   Assessment / Plan:     Visit Diagnoses: Rheumatoid arthritis with rheumatoid factor of multiple sites without organ or systems involvement ([place]) - Positive rheumatoid factor, positive anti-CCP.  She complains of increased pain and discomfort in her joints.  No synovitis was noted.  High risk medication use - Plaquenil 200 mg p.o. twice daily started on August 14, 2019.  Patient needs more aggressive therapy but she was hesitant due to the pandemic.PLQ eye: 03/31/2020 - Plan: CBC with Differential/Platelet, COMPLETE METABOLIC PANEL WITH GFR.  Chronic left shoulder pain -she has been having increased pain and discomfort in the left shoulder joint.  She had painful range of motion.  She had tenderness over subacromial region.  Different treatment options were discussed.  She states the pain is severe and also  nocturnal pain.  Per her request left shoulder joint was injected with cortisone as described above.  She tolerated the procedure well.  Postprocedure instructions were given.  Plan: XR Shoulder Left.  The x-ray of the shoulder joint was unremarkable.  I  handout on shoulder joint exercises was given.  Pain in both hands - Severe narrowing of MCPs, intercarpal and radiocarpal joints was noted on the x-rays.  She has no synovitis on examination.  She has PIP and DIP thickening.  Pes cavus-proper fitting shoes with arch support were discussed.  Chronic midline low back pain without sciatica -she has been having increased pain and discomfort in her lower back.  She is having difficulty with mobility at times.  Per her request x-ray of the lumbar spine were obtained.  Plan: XR Lumbar Spine 2-3 Views.  X-rays of the lumbar spine was consistent with facet joint arthropathy and scoliosis.  A handout on back exercises was given.  Elevated LFTs  Age-related osteoporosis without current pathological fracture - March 19, 2019 BMD as determined from [facility] L1-L2 is 0.763 g/cm2 with a T-Scoreof -3.3.  She was placed on Fosamax May 2021.  - Plan: VITAMIN D 25 Hydroxy (Vit-D Deficiency, Fractures)  Essential hypertension-increased risk of heart disease with rheumatoid arthritis was discussed.  Dietary modifications and exercise were placed in the AVS.  Dyslipidemia  History of IBS  Seasonal allergies  Orders: Orders Placed This Encounter  Procedures  . Large Joint Inj: L subacromial bursa  . XR Shoulder Left  . XR Lumbar Spine 2-3 Views  . CBC with Differential/Platelet  . COMPLETE METABOLIC PANEL WITH GFR  . VITAMIN D 25 Hydroxy (Vit-D Deficiency, Fractures)   No orders of the defined types were placed in this encounter.    Follow-Up Instructions: Return in about 5 months (around 09/25/2020) for Rheumatoid arthritis, Osteoporosis.   Bo Merino, MD  Note - This record has been created  using Editor, commissioning.  Chart creation errors have been sought, but may not always  have been located. Such creation errors do not reflect on  the standard of medical care.

## 2020-04-14 ENCOUNTER — Ambulatory Visit (HOSPITAL_COMMUNITY): Payer: 59

## 2020-04-25 ENCOUNTER — Ambulatory Visit (INDEPENDENT_AMBULATORY_CARE_PROVIDER_SITE_OTHER): Payer: 59 | Admitting: Rheumatology

## 2020-04-25 ENCOUNTER — Encounter: Payer: Self-pay | Admitting: Rheumatology

## 2020-04-25 ENCOUNTER — Other Ambulatory Visit: Payer: Self-pay

## 2020-04-25 ENCOUNTER — Ambulatory Visit: Payer: Self-pay

## 2020-04-25 VITALS — BP 135/89 | HR 80 | Resp 14 | Ht 63.0 in | Wt 185.6 lb

## 2020-04-25 DIAGNOSIS — M0579 Rheumatoid arthritis with rheumatoid factor of multiple sites without organ or systems involvement: Secondary | ICD-10-CM

## 2020-04-25 DIAGNOSIS — I10 Essential (primary) hypertension: Secondary | ICD-10-CM

## 2020-04-25 DIAGNOSIS — R7989 Other specified abnormal findings of blood chemistry: Secondary | ICD-10-CM

## 2020-04-25 DIAGNOSIS — M7061 Trochanteric bursitis, right hip: Secondary | ICD-10-CM

## 2020-04-25 DIAGNOSIS — M810 Age-related osteoporosis without current pathological fracture: Secondary | ICD-10-CM

## 2020-04-25 DIAGNOSIS — M79641 Pain in right hand: Secondary | ICD-10-CM | POA: Diagnosis not present

## 2020-04-25 DIAGNOSIS — Z8719 Personal history of other diseases of the digestive system: Secondary | ICD-10-CM

## 2020-04-25 DIAGNOSIS — Q667 Congenital pes cavus, unspecified foot: Secondary | ICD-10-CM

## 2020-04-25 DIAGNOSIS — J302 Other seasonal allergic rhinitis: Secondary | ICD-10-CM

## 2020-04-25 DIAGNOSIS — Z79899 Other long term (current) drug therapy: Secondary | ICD-10-CM | POA: Diagnosis not present

## 2020-04-25 DIAGNOSIS — G8929 Other chronic pain: Secondary | ICD-10-CM | POA: Diagnosis not present

## 2020-04-25 DIAGNOSIS — M545 Low back pain, unspecified: Secondary | ICD-10-CM

## 2020-04-25 DIAGNOSIS — M79642 Pain in left hand: Secondary | ICD-10-CM

## 2020-04-25 DIAGNOSIS — M5459 Other low back pain: Secondary | ICD-10-CM

## 2020-04-25 DIAGNOSIS — M25512 Pain in left shoulder: Secondary | ICD-10-CM

## 2020-04-25 DIAGNOSIS — E785 Hyperlipidemia, unspecified: Secondary | ICD-10-CM

## 2020-04-25 MED ORDER — TRIAMCINOLONE ACETONIDE 40 MG/ML IJ SUSP
40.0000 mg | INTRAMUSCULAR | Status: AC | PRN
  Administered 2020-04-25: 40 mg via INTRA_ARTICULAR

## 2020-04-25 MED ORDER — LIDOCAINE HCL 1 % IJ SOLN
1.5000 mL | INTRAMUSCULAR | Status: AC | PRN
  Administered 2020-04-25: 1.5 mL

## 2020-04-25 NOTE — Patient Instructions (Addendum)
Shoulder Exercises Ask your health care provider which exercises are safe for you. Do exercises exactly as told by your health care provider and adjust them as directed. It is normal to feel mild stretching, pulling, tightness, or discomfort as you do these exercises. Stop right away if you feel sudden pain or your pain gets worse. Do not begin these exercises until told by your health care provider. Stretching exercises External rotation and abduction This exercise is sometimes called corner stretch. This exercise rotates your arm outward (external rotation) and moves your arm out from your body (abduction). 1. Stand in a doorway with one of your feet slightly in front of the other. This is called a staggered stance. If you cannot reach your forearms to the door frame, stand facing a corner of a room. 2. Choose one of the following positions as told by your health care provider: ? Place your hands and forearms on the door frame above your head. ? Place your hands and forearms on the door frame at the height of your head. ? Place your hands on the door frame at the height of your elbows. 3. Slowly move your weight onto your front foot until you feel a stretch across your chest and in the front of your shoulders. Keep your head and chest upright and keep your abdominal muscles tight. 4. Hold for __________ seconds. 5. To release the stretch, shift your weight to your back foot. Repeat __________ times. Complete this exercise __________ times a day.   Extension, standing 1. Stand and hold a broomstick, a cane, or a similar object behind your back. ? Your hands should be a little wider than shoulder width apart. ? Your palms should face away from your back. 2. Keeping your elbows straight and your shoulder muscles relaxed, move the stick away from your body until you feel a stretch in your shoulders (extension). ? Avoid shrugging your shoulders while you move the stick. Keep your shoulder blades  tucked down toward the middle of your back. 3. Hold for __________ seconds. 4. Slowly return to the starting position. Repeat __________ times. Complete this exercise __________ times a day. Range-of-motion exercises Pendulum 1. Stand near a wall or a surface that you can hold onto for balance. 2. Bend at the waist and let your left / right arm hang straight down. Use your other arm to support you. Keep your back straight and do not lock your knees. 3. Relax your left / right arm and shoulder muscles, and move your hips and your trunk so your left / right arm swings freely. Your arm should swing because of the motion of your body, not because you are using your arm or shoulder muscles. 4. Keep moving your hips and trunk so your arm swings in the following directions, as told by your health care provider: ? Side to side. ? Forward and backward. ? In clockwise and counterclockwise circles. 5. Continue each motion for __________ seconds, or for as long as told by your health care provider. 6. Slowly return to the starting position. Repeat __________ times. Complete this exercise __________ times a day.   Shoulder flexion, standing 1. Stand and hold a broomstick, a cane, or a similar object. Place your hands a little more than shoulder width apart on the object. Your left / right hand should be palm up, and your other hand should be palm down. 2. Keep your elbow straight and your shoulder muscles relaxed. Push the stick up with your healthy   arm to raise your left / right arm in front of your body, and then over your head until you feel a stretch in your shoulder (flexion). ? Avoid shrugging your shoulder while you raise your arm. Keep your shoulder blade tucked down toward the middle of your back. 3. Hold for __________ seconds. 4. Slowly return to the starting position. Repeat __________ times. Complete this exercise __________ times a day.   Shoulder abduction, standing 1. Stand and hold a  broomstick, a cane, or a similar object. Place your hands a little more than shoulder width apart on the object. Your left / right hand should be palm up, and your other hand should be palm down. 2. Keep your elbow straight and your shoulder muscles relaxed. Push the object across your body toward your left / right side. Raise your left / right arm to the side of your body (abduction) until you feel a stretch in your shoulder. ? Do not raise your arm above shoulder height unless your health care provider tells you to do that. ? If directed, raise your arm over your head. ? Avoid shrugging your shoulder while you raise your arm. Keep your shoulder blade tucked down toward the middle of your back. 3. Hold for __________ seconds. 4. Slowly return to the starting position. Repeat __________ times. Complete this exercise __________ times a day. Internal rotation 1. Place your left / right hand behind your back, palm up. 2. Use your other hand to dangle an exercise band, a towel, or a similar object over your shoulder. Grasp the band with your left / right hand so you are holding on to both ends. 3. Gently pull up on the band until you feel a stretch in the front of your left / right shoulder. The movement of your arm toward the center of your body is called internal rotation. ? Avoid shrugging your shoulder while you raise your arm. Keep your shoulder blade tucked down toward the middle of your back. 4. Hold for __________ seconds. 5. Release the stretch by letting go of the band and lowering your hands. Repeat __________ times. Complete this exercise __________ times a day.   Strengthening exercises External rotation 1. Sit in a stable chair without armrests. 2. Secure an exercise band to a stable object at elbow height on your left / right side. 3. Place a soft object, such as a folded towel or a small pillow, between your left / right upper arm and your body to move your elbow about 4 inches (10  cm) away from your side. 4. Hold the end of the exercise band so it is tight and there is no slack. 5. Keeping your elbow pressed against the soft object, slowly move your forearm out, away from your abdomen (external rotation). Keep your body steady so only your forearm moves. 6. Hold for __________ seconds. 7. Slowly return to the starting position. Repeat __________ times. Complete this exercise __________ times a day.   Shoulder abduction 1. Sit in a stable chair without armrests, or stand up. 2. Hold a __________ weight in your left / right hand, or hold an exercise band with both hands. 3. Start with your arms straight down and your left / right palm facing in, toward your body. 4. Slowly lift your left / right hand out to your side (abduction). Do not lift your hand above shoulder height unless your health care provider tells you that this is safe. ? Keep your arms straight. ? Avoid   shrugging your shoulder while you do this movement. Keep your shoulder blade tucked down toward the middle of your back. 5. Hold for __________ seconds. 6. Slowly lower your arm, and return to the starting position. Repeat __________ times. Complete this exercise __________ times a day.   Shoulder extension 1. Sit in a stable chair without armrests, or stand up. 2. Secure an exercise band to a stable object in front of you so it is at shoulder height. 3. Hold one end of the exercise band in each hand. Your palms should face each other. 4. Straighten your elbows and lift your hands up to shoulder height. 5. Step back, away from the secured end of the exercise band, until the band is tight and there is no slack. 6. Squeeze your shoulder blades together as you pull your hands down to the sides of your thighs (extension). Stop when your hands are straight down by your sides. Do not let your hands go behind your body. 7. Hold for __________ seconds. 8. Slowly return to the starting position. Repeat __________  times. Complete this exercise __________ times a day. Shoulder row 1. Sit in a stable chair without armrests, or stand up. 2. Secure an exercise band to a stable object in front of you so it is at waist height. 3. Hold one end of the exercise band in each hand. Position your palms so that your thumbs are facing the ceiling (neutral position). 4. Bend each of your elbows to a 90-degree angle (right angle) and keep your upper arms at your sides. 5. Step back until the band is tight and there is no slack. 6. Slowly pull your elbows back behind you. 7. Hold for __________ seconds. 8. Slowly return to the starting position. Repeat __________ times. Complete this exercise __________ times a day. Shoulder press-ups 1. Sit in a stable chair that has armrests. Sit upright, with your feet flat on the floor. 2. Put your hands on the armrests so your elbows are bent and your fingers are pointing forward. Your hands should be about even with the sides of your body. 3. Push down on the armrests and use your arms to lift yourself off the chair. Straighten your elbows and lift yourself up as much as you comfortably can. ? Move your shoulder blades down, and avoid letting your shoulders move up toward your ears. ? Keep your feet on the ground. As you get stronger, your feet should support less of your body weight as you lift yourself up. 4. Hold for __________ seconds. 5. Slowly lower yourself back into the chair. Repeat __________ times. Complete this exercise __________ times a day.   Wall push-ups 1. Stand so you are facing a stable wall. Your feet should be about one arm-length away from the wall. 2. Lean forward and place your palms on the wall at shoulder height. 3. Keep your feet flat on the floor as you bend your elbows and lean forward toward the wall. 4. Hold for __________ seconds. 5. Straighten your elbows to push yourself back to the starting position. Repeat __________ times. Complete this  exercise __________ times a day.   This information is not intended to replace advice given to you by your health care provider. Make sure you discuss any questions you have with your health care provider. Document Revised: 04/14/2018 Document Reviewed: 01/20/2018 Elsevier Patient Education  2021 [place]. Back Exercises The following exercises strengthen the muscles that help to support the trunk and back. They also  help to keep the lower back flexible. Doing these exercises can help to prevent back pain or lessen existing pain.  If you have back pain or discomfort, try doing these exercises 2-3 times each day or as told by your health care provider.  As your pain improves, do them once each day, but increase the number of times that you repeat the steps for each exercise (do more repetitions).  To prevent the recurrence of back pain, continue to do these exercises once each day or as told by your health care provider. Do exercises exactly as told by your health care provider and adjust them as directed. It is normal to feel mild stretching, pulling, tightness, or discomfort as you do these exercises, but you should stop right away if you feel sudden pain or your pain gets worse. Exercises Single knee to chest Repeat these steps 3-5 times for each leg: 5. Lie on your back on a firm bed or the floor with your legs extended. 6. Bring one knee to your chest. Your other leg should stay extended and in contact with the floor. 7. Hold your knee in place by grabbing your knee or thigh with both hands and hold. 8. Pull on your knee until you feel a gentle stretch in your lower back or buttocks. 9. Hold the stretch for 10-30 seconds. 10. Slowly release and straighten your leg. Pelvic tilt Repeat these steps 5-10 times: 7. Lie on your back on a firm bed or the floor with your legs extended. 8. Bend your knees so they are pointing toward the ceiling and your feet are flat on the  floor. 9. Tighten your lower abdominal muscles to press your lower back against the floor. This motion will tilt your pelvis so your tailbone points up toward the ceiling instead of pointing to your feet or the floor. 10. With gentle tension and even breathing, hold this position for 5-10 seconds. Cat-cow Repeat these steps until your lower back becomes more flexible: 5. Get into a hands-and-knees position on a firm surface. Keep your hands under your shoulders, and keep your knees under your hips. You may place padding under your knees for comfort. 6. Let your head hang down toward your chest. Contract your abdominal muscles and point your tailbone toward the floor so your lower back becomes rounded like the back of a cat. 7. Hold this position for 5 seconds. 8. Slowly lift your head, let your abdominal muscles relax and point your tailbone up toward the ceiling so your back forms a sagging arch like the back of a cow. 9. Hold this position for 5 seconds.   Press-ups Repeat these steps 5-10 times: 5. Lie on your abdomen (face-down) on the floor. 6. Place your palms near your head, about shoulder-width apart. 7. Keeping your back as relaxed as possible and keeping your hips on the floor, slowly straighten your arms to raise the top half of your body and lift your shoulders. Do not use your back muscles to raise your upper torso. You may adjust the placement of your hands to make yourself more comfortable. 8. Hold this position for 5 seconds while you keep your back relaxed. 9. Slowly return to lying flat on the floor.   Bridges Repeat these steps 10 times: 6. Lie on your back on a firm surface. 7. Bend your knees so they are pointing toward the ceiling and your feet are flat on the floor. Your arms should be flat at your sides, next  to your body. 8. Tighten your buttocks muscles and lift your buttocks off the floor until your waist is at almost the same height as your knees. You should feel the  muscles working in your buttocks and the back of your thighs. If you do not feel these muscles, slide your feet 1-2 inches farther away from your buttocks. 9. Hold this position for 3-5 seconds. 10. Slowly lower your hips to the starting position, and allow your buttocks muscles to relax completely. If this exercise is too easy, try doing it with your arms crossed over your chest.   Abdominal crunches Repeat these steps 5-10 times: 8. Lie on your back on a firm bed or the floor with your legs extended. 9. Bend your knees so they are pointing toward the ceiling and your feet are flat on the floor. 10. Cross your arms over your chest. 11. Tip your chin slightly toward your chest without bending your neck. 12. Tighten your abdominal muscles and slowly raise your trunk (torso) high enough to lift your shoulder blades a tiny bit off the floor. Avoid raising your torso higher than that because it can put too much stress on your low back and does not help to strengthen your abdominal muscles. 13. Slowly return to your starting position. Back lifts Repeat these steps 5-10 times: 7. Lie on your abdomen (face-down) with your arms at your sides, and rest your forehead on the floor. 8. Tighten the muscles in your legs and your buttocks. 9. Slowly lift your chest off the floor while you keep your hips pressed to the floor. Keep the back of your head in line with the curve in your back. Your eyes should be looking at the floor. 10. Hold this position for 3-5 seconds. 11. Slowly return to your starting position. Contact a health care provider if:  Your back pain or discomfort gets much worse when you do an exercise.  Your worsening back pain or discomfort does not lessen within 2 hours after you exercise. If you have any of these problems, stop doing these exercises right away. Do not do them again unless your health care provider says that you can. Get help right away if:  You develop sudden, severe  back pain. If this happens, stop doing the exercises right away. Do not do them again unless your health care provider says that you can. This information is not intended to replace advice given to you by your health care provider. Make sure you discuss any questions you have with your health care provider. Document Revised: 04/27/2018 Document Reviewed: 09/22/2017 Elsevier Patient Education  [place].  Heart Disease Prevention   Your inflammatory disease increases your risk of heart disease which includes heart attack, stroke, atrial fibrillation (irregular heartbeats), high blood pressure, heart failure and atherosclerosis (plaque in the arteries).  It is important to reduce your risk by:   . Keep blood pressure, cholesterol, and blood sugar at healthy levels   . Smoking Cessation   . Maintain a healthy weight  o BMI 20-25   . Eat a healthy diet  o Plenty of fresh fruit, vegetables, and whole grains  o Limit saturated fats, foods high in sodium, and added sugars  o DASH and Mediterranean diet   . Increase physical activity  o Recommend moderate physically activity for 150 minutes per week/ 30 minutes a day for five days a week These can be broken up into three separate ten-minute sessions during the day.   Marland Kitchen  Reduce Stress  . Meditation, slow breathing exercises, yoga, coloring books  . Dental visits twice a year

## 2020-04-26 LAB — COMPLETE METABOLIC PANEL WITH GFR
AG Ratio: 1.5 (calc) (ref 1.0–2.5)
ALT: 26 U/L (ref 6–29)
AST: 30 U/L (ref 10–35)
Albumin: 4.7 g/dL (ref 3.6–5.1)
Alkaline phosphatase (APISO): 77 U/L (ref 37–153)
BUN: 15 mg/dL (ref 7–25)
CO2: 27 mmol/L (ref 20–32)
Calcium: 10 mg/dL (ref 8.6–10.4)
Chloride: 103 mmol/L (ref 98–110)
Creat: 0.79 mg/dL (ref 0.50–0.99)
GFR, Est African American: 89 mL/min/{1.73_m2} (ref >=60)
GFR, Est Non African American: 76 mL/min/{1.73_m2} (ref >=60)
Globulin: 3.1 g/dL (calc) (ref 1.9–3.7)
Glucose, Bld: 109 mg/dL — ABNORMAL HIGH (ref 65–99)
Potassium: 4.1 mmol/L (ref 3.5–5.3)
Sodium: 140 mmol/L (ref 135–146)
Total Bilirubin: 0.8 mg/dL (ref 0.2–1.2)
Total Protein: 7.8 g/dL (ref 6.1–8.1)

## 2020-04-26 LAB — CBC WITH DIFFERENTIAL/PLATELET
Absolute Monocytes: 656 cells/uL (ref 200–950)
Basophils Absolute: 105 cells/uL (ref 0–200)
Basophils Relative: 1.1 %
Eosinophils Absolute: 124 cells/uL (ref 15–500)
Eosinophils Relative: 1.3 %
HCT: 45.4 % — ABNORMAL HIGH (ref 35.0–45.0)
Hemoglobin: 14.6 g/dL (ref 11.7–15.5)
Lymphs Abs: 1967 cells/uL (ref 850–3900)
MCH: 27.7 pg (ref 27.0–33.0)
MCHC: 32.2 g/dL (ref 32.0–36.0)
MCV: 86 fL (ref 80.0–100.0)
MPV: 11.2 fL (ref 7.5–12.5)
Monocytes Relative: 6.9 %
Neutro Abs: 6650 cells/uL (ref 1500–7800)
Neutrophils Relative %: 70 %
Platelets: 273 10*3/uL (ref 140–400)
RBC: 5.28 10*6/uL — ABNORMAL HIGH (ref 3.80–5.10)
RDW: 13.4 % (ref 11.0–15.0)
Total Lymphocyte: 20.7 %
WBC: 9.5 10*3/uL (ref 3.8–10.8)

## 2020-04-26 LAB — VITAMIN D 25 HYDROXY (VIT D DEFICIENCY, FRACTURES): Vit D, 25-Hydroxy: 47 ng/mL (ref 30–100)

## 2020-04-27 NOTE — Progress Notes (Signed)
CBC and CMP are stable.  Vitamin D is within normal limits.

## 2020-06-05 ENCOUNTER — Ambulatory Visit (HOSPITAL_COMMUNITY): Payer: 59

## 2020-06-05 ENCOUNTER — Ambulatory Visit (HOSPITAL_COMMUNITY)
Admission: RE | Admit: 2020-06-05 | Discharge: 2020-06-05 | Disposition: A | Discharge status: Home / Self Care | Payer: 59 | Source: Ambulatory Visit | Attending: Family Medicine | Admitting: Family Medicine

## 2020-06-05 DIAGNOSIS — Z1231 Encounter for screening mammogram for malignant neoplasm of breast: Secondary | ICD-10-CM | POA: Diagnosis present

## 2020-06-26 DIAGNOSIS — M0520 Rheumatoid vasculitis with rheumatoid arthritis of unspecified site: Secondary | ICD-10-CM | POA: Insufficient documentation

## 2020-06-26 DIAGNOSIS — D510 Vitamin B12 deficiency anemia due to intrinsic factor deficiency: Secondary | ICD-10-CM | POA: Insufficient documentation

## 2020-06-26 DIAGNOSIS — I38 Endocarditis, valve unspecified: Secondary | ICD-10-CM | POA: Insufficient documentation

## 2020-07-09 ENCOUNTER — Ambulatory Visit: Payer: 59 | Admitting: Physician Assistant

## 2020-07-31 ENCOUNTER — Ambulatory Visit: Payer: 59 | Admitting: Physician Assistant

## 2020-09-11 NOTE — Progress Notes (Deleted)
Office Visit Note  Patient: Tammy Jensen             Date of Birth: Jan 01, 1951           MRN: [id number]             PCP: Alfonse Flavors, MD Referring: Alfonse Flavors,* Visit Date: 09/25/2020 Occupation: '@GUAROCC'$ @  Subjective:    History of Present Illness: Anisten Gulyas is a 70 y.o. female with history of seropositive rheumatoid arthritis.  She is taking plaquenil 200 mg 1 tablet by mouth twice daily.    Activities of Daily Living:  Patient reports morning stiffness for *** {minute/hour:19697}.   Patient {ACTIONS;DENIES/REPORTS:21021675::"Denies"} nocturnal pain.  Difficulty dressing/grooming: {ACTIONS;DENIES/REPORTS:21021675::"Denies"} Difficulty climbing stairs: {ACTIONS;DENIES/REPORTS:21021675::"Denies"} Difficulty getting out of chair: {ACTIONS;DENIES/REPORTS:21021675::"Denies"} Difficulty using hands for taps, buttons, cutlery, and/or writing: {ACTIONS;DENIES/REPORTS:21021675::"Denies"}  No Rheumatology ROS completed.   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 involvement ([place]) 09/06/2019   High risk medication use 09/06/2019   Elevated LFTs 09/06/2019   Age-related osteoporosis without current pathological fracture 09/06/2019   Dyslipidemia 08/14/2019   Abnormal electrocardiogram 03/12/2019   Chest pain 03/12/2019   HTN (hypertension) 03/12/2019   Obesity 03/12/2019   Family history of colon cancer 05/28/2016    Past Medical History:  Diagnosis Date   Hypertension    IBS (irritable bowel syndrome)     Family History  Problem Relation Age of Onset   Colon cancer Father    Colon cancer Sister    Heart disease Mother    Dementia Mother    Arthritis Mother    Hemachromatosis Son    Hemachromatosis Son    Past Surgical History:  Procedure Laterality Date   COLONOSCOPY N/A 10/07/2016   Procedure: COLONOSCOPY;  Surgeon:  Rogene Houston, MD;  Location: [facility];  Service: Endoscopy;  Laterality: N/A;  730   TUBAL LIGATION     Social History   Social History Narrative   Not on file   Immunization History  Administered Date(s) Administered   PFIZER(Purple Top)SARS-COV-2 Vaccination 09/20/2019, 10/15/2019     Objective: Vital Signs: There were no vitals taken for this visit.   Physical Exam Vitals and nursing note reviewed.  Constitutional:      Appearance: She is well-developed.  HENT:     Head: Normocephalic and atraumatic.  Eyes:     Conjunctiva/sclera: Conjunctivae normal.  Pulmonary:     Effort: Pulmonary effort is normal.  Abdominal:     Palpations: Abdomen is soft.  Musculoskeletal:     Cervical back: Normal range of motion.  Skin:    General: Skin is warm and dry.     Capillary Refill: Capillary refill takes less than 2 seconds.  Neurological:     Mental Status: She is alert and oriented to person, place, and time.  Psychiatric:        Behavior: Behavior normal.     Musculoskeletal Exam: ***  CDAI Exam: CDAI Score: -- Patient Global: --; Provider Global: -- Swollen: --; Tender: -- Joint Exam 09/25/2020   No joint exam has been documented for this visit   There is currently no information documented on the homunculus. Go to the Rheumatology activity and complete the homunculus joint exam.  Investigation: No additional findings.  Imaging: No results found.  Recent Labs: Lab Results  Component Value Date   WBC 9.5 04/25/2020   HGB 14.6  04/25/2020   PLT 273 04/25/2020   NA 140 04/25/2020   K 4.1 04/25/2020   CL 103 04/25/2020   CO2 27 04/25/2020   GLUCOSE 109 (H) 04/25/2020   BUN 15 04/25/2020   CREATININE 0.79 04/25/2020   BILITOT 0.8 04/25/2020   ALKPHOS 87 12/03/2017   AST 30 04/25/2020   ALT 26 04/25/2020   PROT 7.8 04/25/2020   ALBUMIN 4.3 12/03/2017   CALCIUM 10.0 04/25/2020   GFRAA 89 04/25/2020    Speciality Comments:  PLQ WNL 03/31/2020  [facility]  Procedures:  No procedures performed Allergies: Patient has no known allergies.   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    High risk medication use - Plaquenil 200 mg p.o. twice daily started on August 14, 2019.  Patient needs more aggressive therapy but she was hesitant due to the pandemic.PLQ eye: 03/31/2020  Chronic left shoulder pain  Pain in both hands - Severe narrowing of MCPs, intercarpal and radiocarpal joints was noted on the x-rays.  Pes cavus  Chronic midline low back pain without sciatica  Elevated LFTs  Age-related osteoporosis without current pathological fracture - March 19, 2019 BMD as determined from [facility] L1-L2 is 0.763 g/cm2 with a T-Scoreof -3.3.  She was placed on Fosamax May 2021.  Essential hypertension  History of IBS  Dyslipidemia  Seasonal allergies  Orders: No orders of the defined types were placed in this encounter.  No orders of the defined types were placed in this encounter.    Follow-Up Instructions: No follow-ups on file.   Ofilia Neas, PA-C  Note - This record has been created using Dragon software.  Chart creation errors have been sought, but may not always  have been located. Such creation errors do not reflect on  the standard of medical care.

## 2020-09-18 IMAGING — DX DG KNEE COMPLETE 4+V*R*
4 series · 4 of 4 positions shown · non-contrast
Comparison: None.

CLINICAL DATA: Pain after fall

EXAM:
RIGHT KNEE - COMPLETE 4+ VIEW

[knee ap]
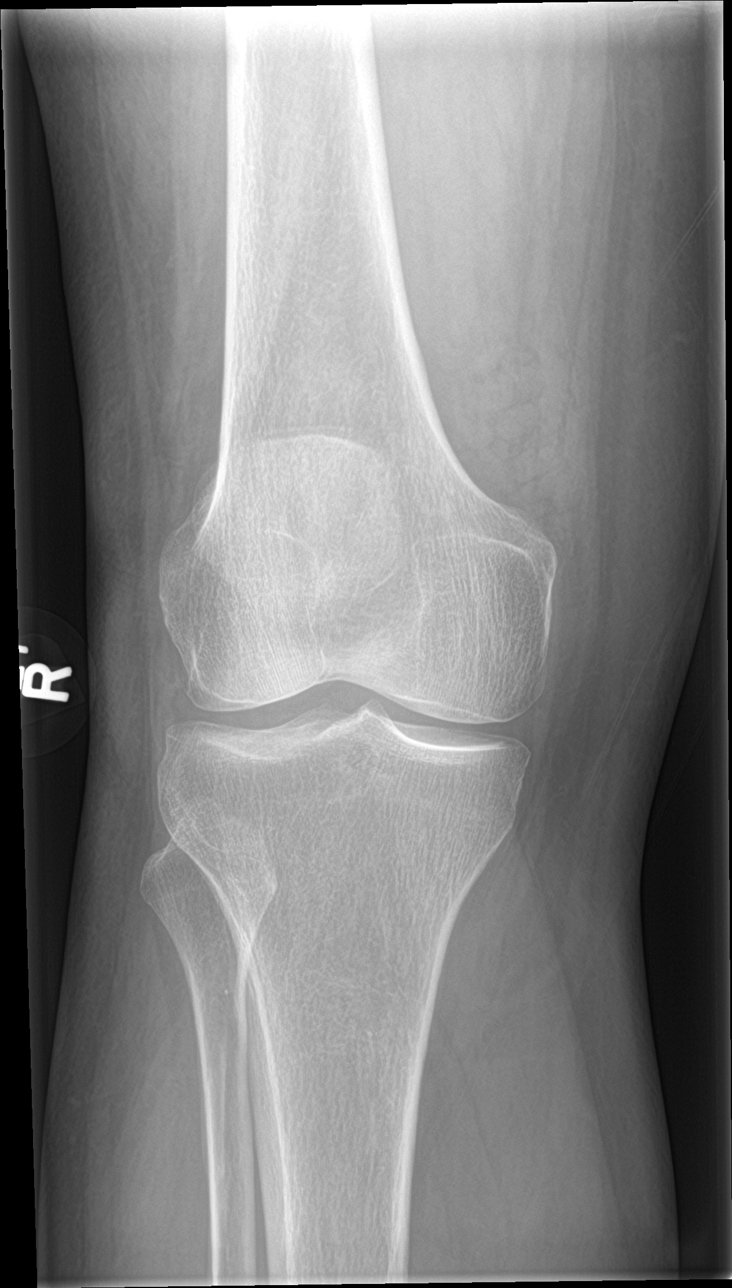

[tunnel]
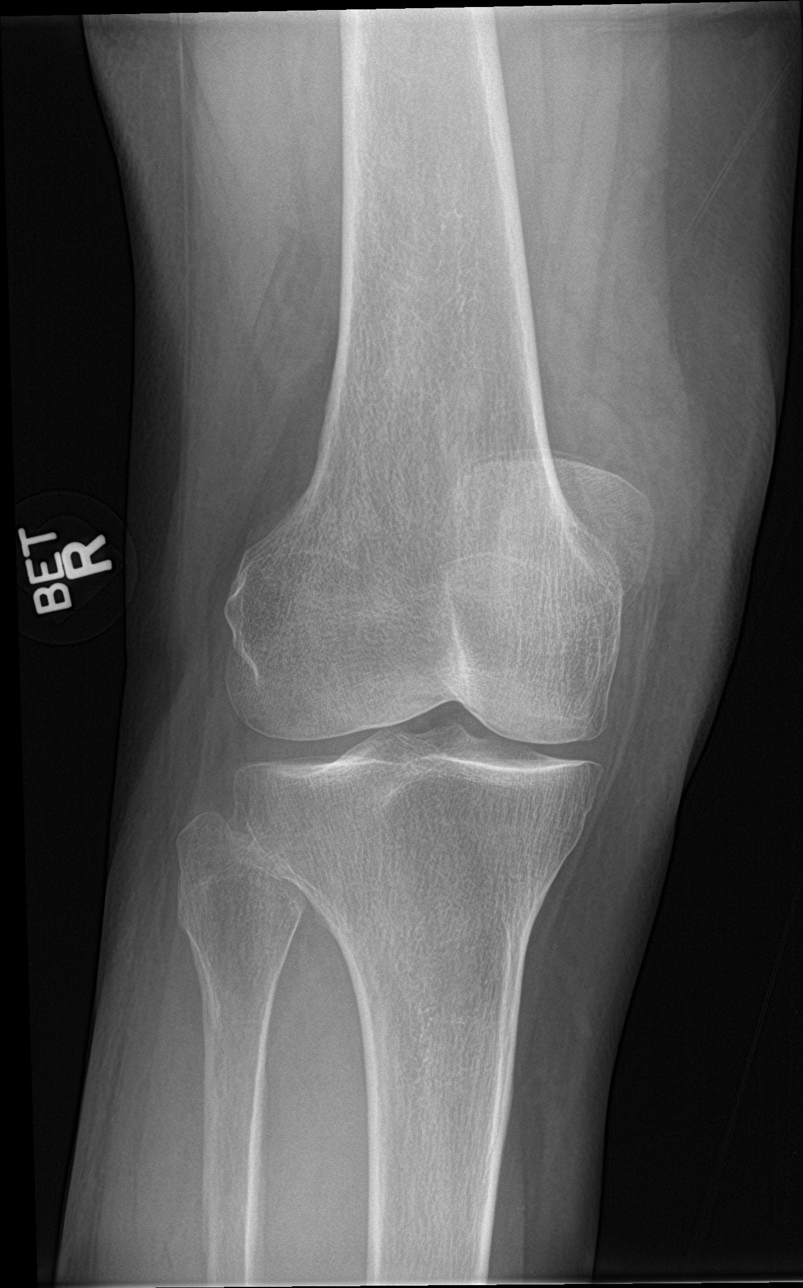

[knee lat]
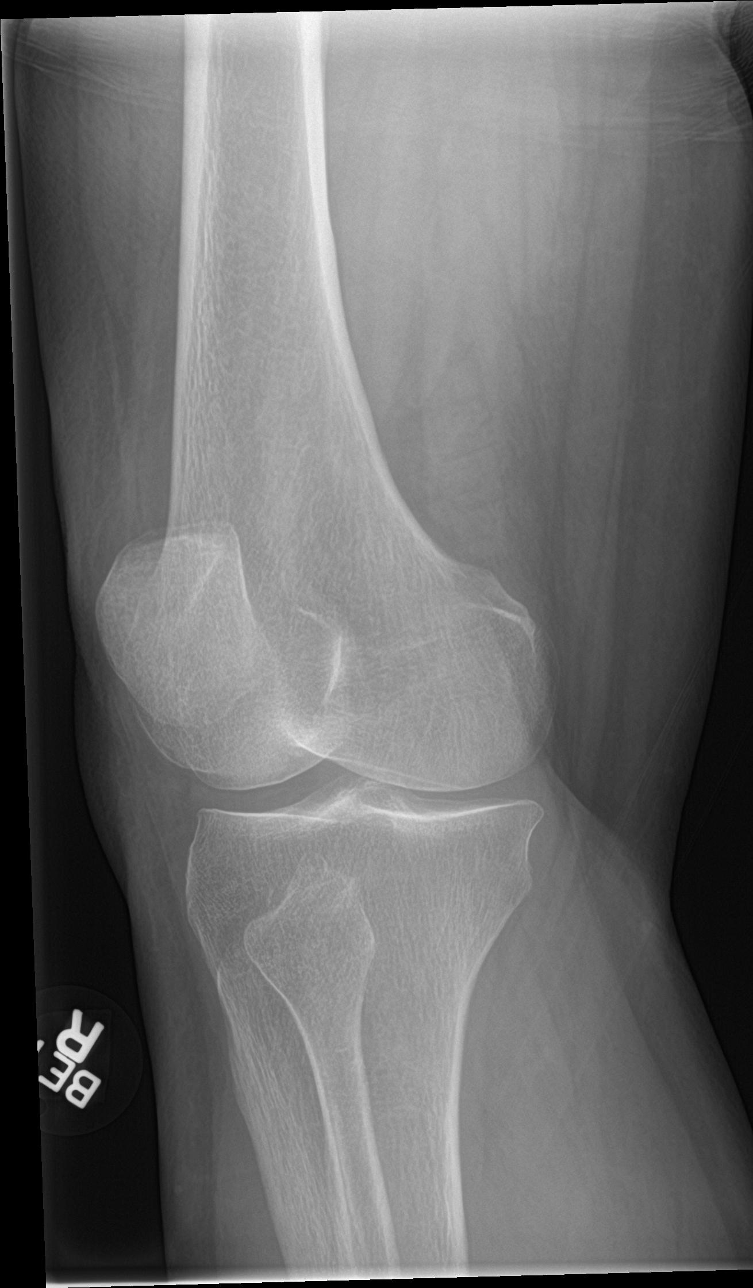

[knee sunrise]
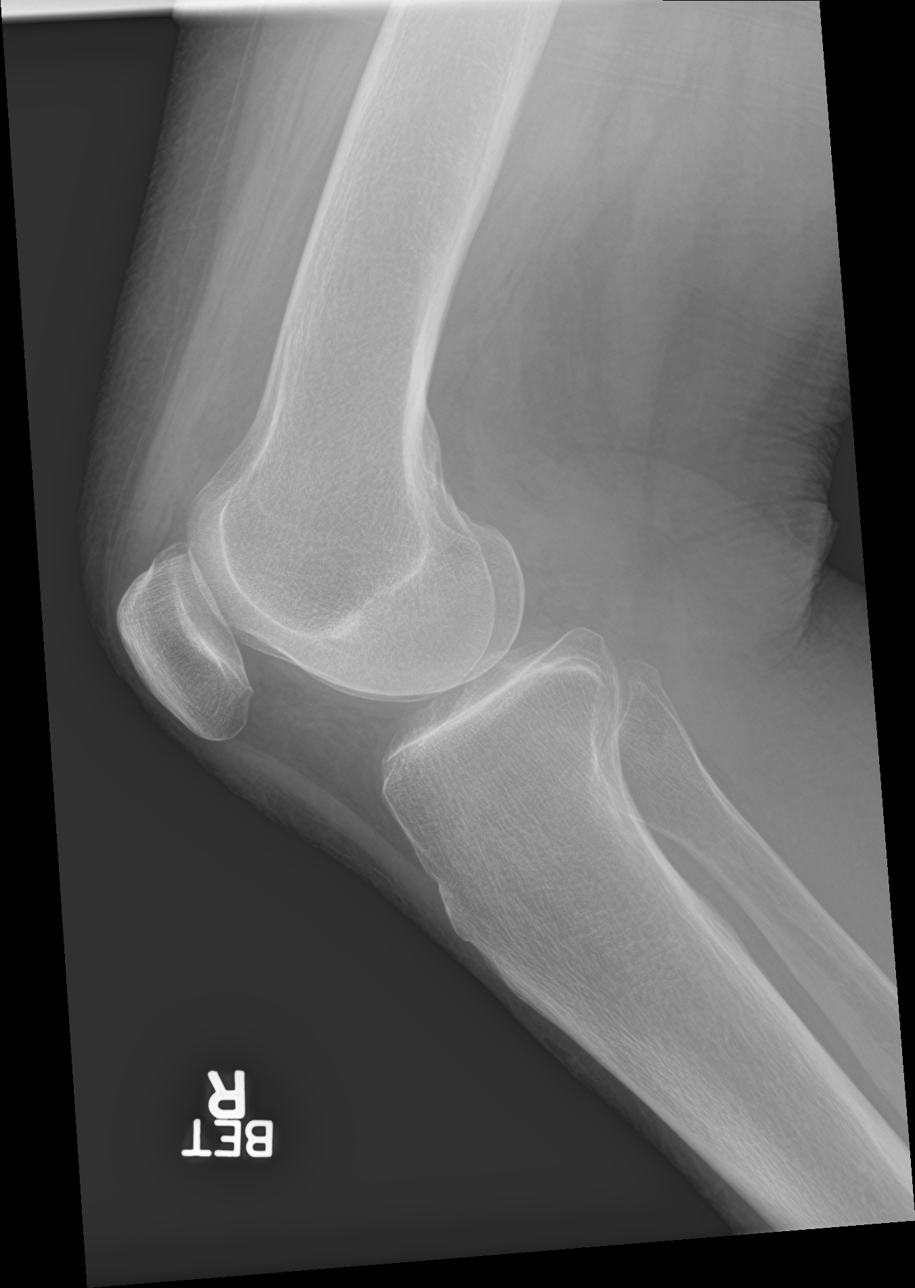

[4 of 4 positions shown; findings below may reference images not displayed]

FINDINGS: No evidence of fracture, dislocation, or joint effusion. No evidence
of arthropathy or other focal bone abnormality. Soft tissues are
unremarkable.
IMPRESSION: Negative.

## 2020-09-18 IMAGING — DX DG LUMBAR SPINE COMPLETE 4+V
5 series · 5 of 5 positions shown · non-contrast
Comparison: None.

CLINICAL DATA: Pain after fall

EXAM:
LUMBAR SPINE - COMPLETE 4+ VIEW

[l-spine ap (1 of 3)]
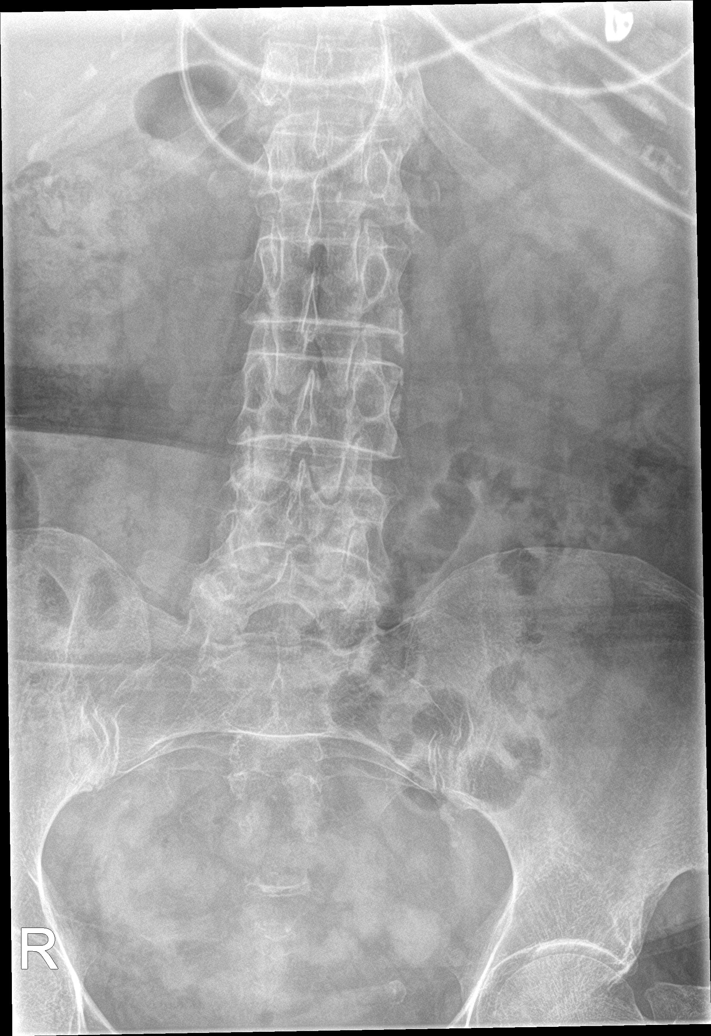

[l-spine lat (1 of 2)]
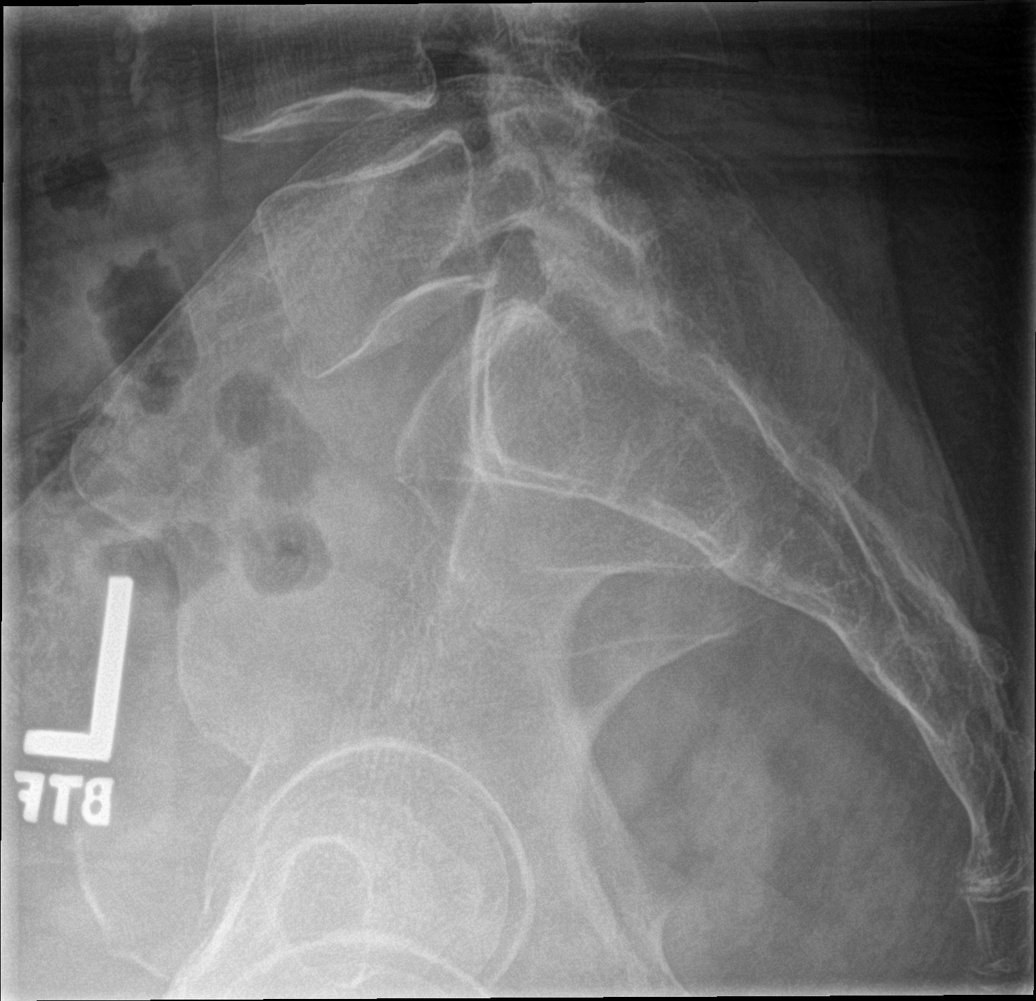

[l-spine ap (2 of 3)]
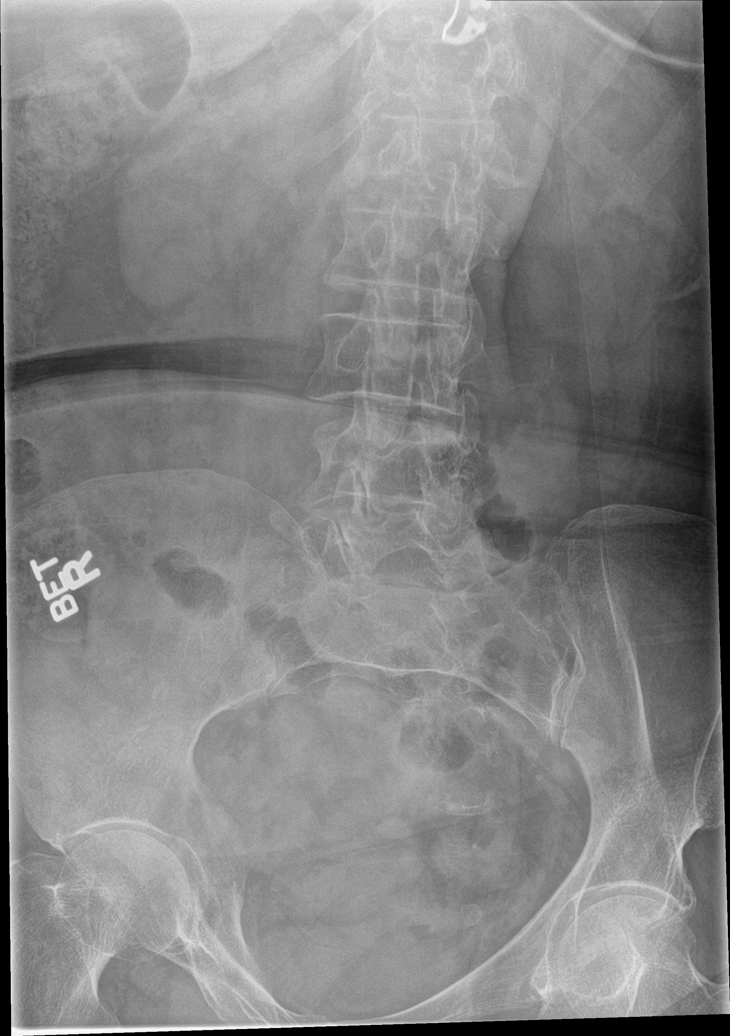

[l-spine ap (3 of 3)]
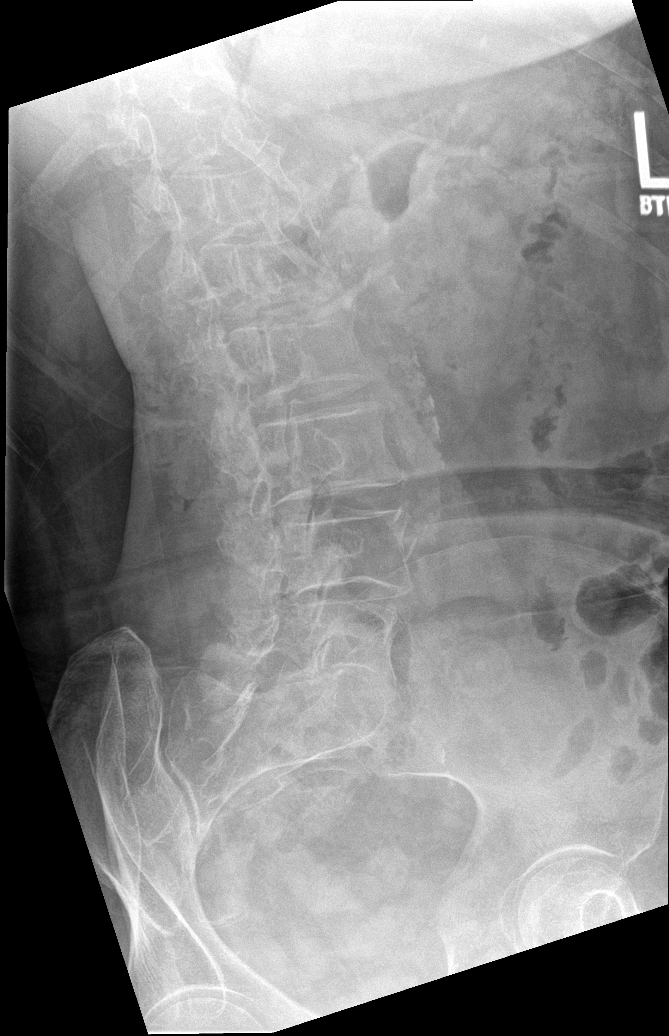

[l-spine lat (2 of 2)]
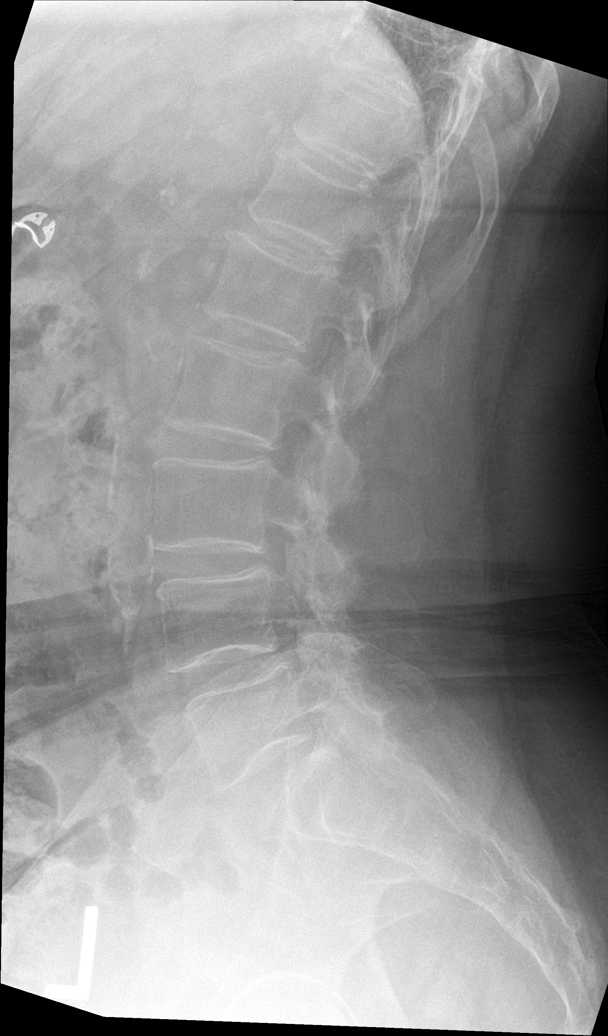

[5 of 5 positions shown; findings below may reference images not displayed]

FINDINGS: No fracture or traumatic malalignment. Calcified atherosclerosis in
the abdominal aorta. Minimal degenerative disc disease. No other
acute abnormalities.
IMPRESSION: No fracture or traumatic malalignment.

## 2020-09-25 ENCOUNTER — Ambulatory Visit: Payer: 59 | Admitting: Physician Assistant

## 2020-09-25 DIAGNOSIS — Z79899 Other long term (current) drug therapy: Secondary | ICD-10-CM

## 2020-09-25 DIAGNOSIS — M545 Low back pain, unspecified: Secondary | ICD-10-CM

## 2020-09-25 DIAGNOSIS — E785 Hyperlipidemia, unspecified: Secondary | ICD-10-CM

## 2020-09-25 DIAGNOSIS — M0579 Rheumatoid arthritis with rheumatoid factor of multiple sites without organ or systems involvement: Secondary | ICD-10-CM

## 2020-09-25 DIAGNOSIS — M810 Age-related osteoporosis without current pathological fracture: Secondary | ICD-10-CM

## 2020-09-25 DIAGNOSIS — G8929 Other chronic pain: Secondary | ICD-10-CM

## 2020-09-25 DIAGNOSIS — R7989 Other specified abnormal findings of blood chemistry: Secondary | ICD-10-CM

## 2020-09-25 DIAGNOSIS — J302 Other seasonal allergic rhinitis: Secondary | ICD-10-CM

## 2020-09-25 DIAGNOSIS — Z8719 Personal history of other diseases of the digestive system: Secondary | ICD-10-CM

## 2020-09-25 DIAGNOSIS — I10 Essential (primary) hypertension: Secondary | ICD-10-CM

## 2020-09-25 DIAGNOSIS — M79641 Pain in right hand: Secondary | ICD-10-CM

## 2020-09-25 DIAGNOSIS — Q667 Congenital pes cavus, unspecified foot: Secondary | ICD-10-CM

## 2020-11-04 ENCOUNTER — Ambulatory Visit: Payer: 59 | Admitting: Physician Assistant

## 2021-01-14 ENCOUNTER — Other Ambulatory Visit: Payer: Self-pay

## 2021-01-29 DIAGNOSIS — E782 Mixed hyperlipidemia: Secondary | ICD-10-CM | POA: Insufficient documentation

## 2021-02-11 ENCOUNTER — Ambulatory Visit (INDEPENDENT_AMBULATORY_CARE_PROVIDER_SITE_OTHER): Payer: 59 | Admitting: Urology

## 2021-02-11 ENCOUNTER — Encounter: Payer: Self-pay | Admitting: Urology

## 2021-02-11 ENCOUNTER — Other Ambulatory Visit: Payer: Self-pay

## 2021-02-11 ENCOUNTER — Ambulatory Visit (HOSPITAL_COMMUNITY)
Admission: RE | Admit: 2021-02-11 | Discharge: 2021-02-11 | Disposition: A | Discharge status: Home / Self Care | Payer: 59 | Source: Ambulatory Visit | Attending: Urology | Admitting: Urology

## 2021-02-11 ENCOUNTER — Encounter (HOSPITAL_COMMUNITY): Payer: Self-pay

## 2021-02-11 VITALS — BP 122/73 | HR 63

## 2021-02-11 DIAGNOSIS — R3129 Other microscopic hematuria: Secondary | ICD-10-CM | POA: Diagnosis present

## 2021-02-11 LAB — URINALYSIS, ROUTINE W REFLEX MICROSCOPIC
Bilirubin, UA: NEGATIVE
Glucose, UA: NEGATIVE
Ketones, UA: NEGATIVE
Nitrite, UA: NEGATIVE
Protein,UA: NEGATIVE
Specific Gravity, UA: 1.025 (ref 1.005–1.030)
Urobilinogen, Ur: 0.2 mg/dL (ref 0.2–1.0)
pH, UA: 5.5 (ref 5.0–7.5)

## 2021-02-11 LAB — MICROSCOPIC EXAMINATION

## 2021-02-11 LAB — POCT I-STAT CREATININE: Creatinine, Ser: 1 mg/dL (ref 0.44–1.00)

## 2021-02-11 MED ORDER — IOHEXOL 300 MG/ML  SOLN
100.0000 mL | Freq: Once | INTRAMUSCULAR | Status: AC | PRN
  Administered 2021-02-11: 100 mL via INTRAVENOUS

## 2021-02-11 NOTE — Patient Instructions (Signed)

## 2021-02-11 NOTE — Progress Notes (Signed)
02/11/2021 9:38 AM   Brunilda Payor 1950-10-04 [id number]  Referring provider: Abran Richard, MD [address] [place],  [place] [address]  Microhematuria   HPI: Tammy Jensen is a 71yo here for evaluation of microhematuria. For several years she has been told she has blood in her urine. She has a hx of nephrolithiasis with her last event 40 years ago. No hx of tobacco abuse. She is a [employer]. For the past 2 weeks she has had mild left flank pain that is sharp, intermittent, and nonraditing. She has occasional urinary urgency and dysuria. No hx of UTI.    PMH: Past Medical History:  Diagnosis Date   Hypertension    IBS (irritable bowel syndrome)     Surgical History: Past Surgical History:  Procedure Laterality Date   COLONOSCOPY N/A 10/07/2016   Procedure: COLONOSCOPY;  Surgeon: Rogene Houston, MD;  [facility];  Service: Endoscopy;  Laterality: N/A;  730   TUBAL LIGATION      Home Medications:  Allergies as of 02/11/2021   No Known Allergies      Medication List        Accurate as of February 11, 2021  9:38 AM. If you have any questions, ask your nurse or doctor.          alendronate 70 MG tablet Commonly known as: FOSAMAX Take 70 mg by mouth once a week.   amLODipine 5 MG tablet Commonly known as: NORVASC Take 5 mg by mouth daily.   aspirin 81 MG tablet Take 81 mg by mouth daily.   benazepril 40 MG tablet Commonly known as: LOTENSIN Take 40 mg by mouth daily.   CALCIUM 600 PO Take by mouth daily.   Cholecalciferol 50 MCG (2000 UT) Tabs Take 2,000 Units by mouth daily.   cyanocobalamin 1000 MCG/ML injection Commonly known as: (VITAMIN B-12) Inject 1,000 mcg into the muscle every 30 (thirty) days. Due 10-06-11   fexofenadine 60 MG tablet Commonly known as: ALLEGRA Take 60 mg by mouth 2 (two) times daily as needed for allergies or rhinitis.   fluorouracil 5 % cream Commonly known as: EFUDEX APPLY EXTERNALLY TO THE AFFECTED AREA  TWICE DAILY FOR 30 DAYS COMPLETE HEALING MAY NOT BE NOTICABLE UNTIL 1 TO 2 MONTHS FOLLOWING THIS TREAT   fluticasone 50 MCG/ACT nasal spray Commonly known as: FLONASE Place into both nostrils daily as needed for allergies or rhinitis.   furosemide 20 MG tablet Commonly known as: LASIX Take 20 mg by mouth daily.   hydroxychloroquine 200 MG tablet Commonly known as: PLAQUENIL Take 1 tablet (200 mg total) by mouth 2 (two) times daily.   naproxen 500 MG tablet Commonly known as: NAPROSYN Take 500 mg by mouth 2 (two) times daily as needed for mild pain.   rosuvastatin 10 MG tablet Commonly known as: CRESTOR Take 10 mg by mouth daily.   traZODone 50 MG tablet Commonly known as: DESYREL Take 50 mg by mouth at bedtime as needed for sleep.   VITAMIN C PO Take by mouth daily.        Allergies: No Known Allergies  Family History: Family History  Problem Relation Age of Onset   Colon cancer Father    Colon cancer Sister    Heart disease Mother    Dementia Mother    Arthritis Mother    Hemachromatosis Son    Hemachromatosis Son     Social History:  reports that she has never smoked. She has never  used smokeless tobacco. She reports that she does not currently use alcohol. She reports that she does not use drugs.  ROS: All other review of systems were reviewed and are negative except what is noted above in HPI  Physical Exam: BP 122/73    Pulse 63   Constitutional:  Alert and oriented, No acute distress. HEENT: [place] AT, moist mucus membranes.  Trachea midline, no masses. Cardiovascular: No clubbing, cyanosis, or edema. Respiratory: Normal respiratory effort, no increased work of breathing. GI: Abdomen is soft, nontender, nondistended, no abdominal masses GU: No CVA tenderness.  Lymph: No cervical or inguinal lymphadenopathy. Skin: No rashes, bruises or suspicious lesions. Neurologic: Grossly intact, no focal deficits, moving all 4 extremities. Psychiatric: Normal mood  and affect.  Laboratory Data: Lab Results  Component Value Date   WBC 9.5 04/25/2020   HGB 14.6 04/25/2020   HCT 45.4 (H) 04/25/2020   MCV 86.0 04/25/2020   PLT 273 04/25/2020    Lab Results  Component Value Date   CREATININE 0.79 04/25/2020    No results found for: PSA  No results found for: TESTOSTERONE  No results found for: HGBA1C  Urinalysis    Component Value Date/Time   COLORURINE YELLOW 12/03/2017 [place] 12/03/2017 1531   LABSPEC 1.011 12/03/2017 1531   PHURINE 6.0 12/03/2017 1531   GLUCOSEU NEGATIVE 12/03/2017 1531   HGBUR SMALL (A) 12/03/2017 1531   BILIRUBINUR NEGATIVE 12/03/2017 1531   KETONESUR NEGATIVE 12/03/2017 1531   PROTEINUR NEGATIVE 12/03/2017 1531   NITRITE NEGATIVE 12/03/2017 1531   LEUKOCYTESUR TRACE (A) 12/03/2017 1531    Lab Results  Component Value Date   BACTERIA NONE SEEN 12/03/2017    Pertinent Imaging:  No results found for this or any previous visit.  No results found for this or any previous visit.  No results found for this or any previous visit.  No results found for this or any previous visit.  No results found for this or any previous visit.  No results found for this or any previous visit.  No results found for this or any previous visit.  No results found for this or any previous visit.   Assessment & Plan:    1. Microhematuria -BMP -Urine cytology due to intermittent dysuria -CT hematuria -Office cystoscopy - Urinalysis, Routine w reflex microscopic   No follow-ups on file.  Nicolette Bang, MD  [facility] [place]

## 2021-02-12 LAB — CYTOLOGY, URINE

## 2021-02-12 LAB — BASIC METABOLIC PANEL
BUN/Creatinine Ratio: 24 (ref 12–28)
BUN: 20 mg/dL (ref 8–27)
CO2: 23 mmol/L (ref 20–29)
Calcium: 9.7 mg/dL (ref 8.7–10.3)
Chloride: 104 mmol/L (ref 96–106)
Creatinine, Ser: 0.84 mg/dL (ref 0.57–1.00)
Glucose: 82 mg/dL (ref 70–99)
Potassium: 3.9 mmol/L (ref 3.5–5.2)
Sodium: 142 mmol/L (ref 134–144)
eGFR: 75 mL/min/{1.73_m2} (ref >59)

## 2021-02-20 ENCOUNTER — Telehealth: Payer: Self-pay

## 2021-02-20 DIAGNOSIS — N859 Noninflammatory disorder of uterus, unspecified: Secondary | ICD-10-CM

## 2021-02-20 NOTE — Telephone Encounter (Signed)
Patient calling for CT results- message sent to MD

## 2021-02-24 NOTE — Progress Notes (Signed)
Sent via mail 

## 2021-02-24 NOTE — Telephone Encounter (Signed)
Patient called requesting CT results. Please advise.

## 2021-02-26 NOTE — Telephone Encounter (Signed)
Per Dr. Alyson Ingles- The Ct showed no GU abnormalities but is showed a thickened uterine lining. I will refer her to gynecology. Unless she already has one then she can see them.   Patient called and notified. No GYN MD- would like referral to [place], referral sent.

## 2021-03-16 ENCOUNTER — Other Ambulatory Visit: Payer: 59 | Admitting: Urology

## 2021-04-03 ENCOUNTER — Other Ambulatory Visit (HOSPITAL_COMMUNITY)
Admission: RE | Admit: 2021-04-03 | Discharge: 2021-04-03 | Disposition: A | Discharge status: Home / Self Care | Payer: 59 | Source: Ambulatory Visit | Attending: Obstetrics & Gynecology | Admitting: Obstetrics & Gynecology

## 2021-04-03 ENCOUNTER — Encounter: Payer: Self-pay | Admitting: Obstetrics & Gynecology

## 2021-04-03 ENCOUNTER — Ambulatory Visit (INDEPENDENT_AMBULATORY_CARE_PROVIDER_SITE_OTHER): Payer: 59 | Admitting: Obstetrics & Gynecology

## 2021-04-03 VITALS — BP 129/89 | HR 50 | Ht 63.0 in | Wt 166.0 lb

## 2021-04-03 DIAGNOSIS — R9389 Abnormal findings on diagnostic imaging of other specified body structures: Secondary | ICD-10-CM | POA: Insufficient documentation

## 2021-04-03 DIAGNOSIS — N813 Complete uterovaginal prolapse: Secondary | ICD-10-CM | POA: Diagnosis not present

## 2021-04-03 DIAGNOSIS — N814 Uterovaginal prolapse, unspecified: Secondary | ICD-10-CM

## 2021-04-03 NOTE — Progress Notes (Signed)
Endometrial Biopsy Procedure Note  Pre-operative Diagnosis: Post menopausal bleeding with thickened endometrium on sonogram  Post-operative Diagnosis: same  Indications: postmenopausal bleeding  Procedure Details   Urine pregnancy test was not done.  The risks (including infection, bleeding, pain, and uterine perforation) and benefits of the procedure were explained to the patient and Written informed consent was obtained.  Antibiotic prophylaxis against endocarditis was not indicated.   The patient was placed in the dorsal lithotomy position.  Bimanual exam showed the uterus to be in the neutral position.  A Graves' speculum inserted in the vagina, and the cervix prepped with povidone iodine.  Endocervical curettage with a Kevorkian curette was not performed.   A sharp tenaculum was applied to the anterior lip of the cervix for stabilization.  A sterile uterine sound was used to sound the uterus to a depth of 6.5 cm.  A Pipelle endometrial aspirator was used to sample the endometrium.  Sample was sent for pathologic examination.  Condition: Stable  Complications: None  Plan:  The patient was advised to call for any fever or for prolonged or severe pain or bleeding. She was advised to use OTC analgesics as needed for mild to moderate pain. She was advised to avoid vaginal intercourse for 48 hours or until the bleeding has completely stopped.  Attending Physician Documentation: I was present for or performed the following: endometrial biopsy

## 2021-04-06 LAB — SURGICAL PATHOLOGY

## 2021-04-09 ENCOUNTER — Encounter: Payer: Self-pay | Admitting: Obstetrics & Gynecology

## 2021-04-09 ENCOUNTER — Ambulatory Visit (INDEPENDENT_AMBULATORY_CARE_PROVIDER_SITE_OTHER): Payer: 59 | Admitting: Obstetrics & Gynecology

## 2021-04-09 VITALS — BP 119/78 | HR 61 | Ht 63.0 in

## 2021-04-09 DIAGNOSIS — N813 Complete uterovaginal prolapse: Secondary | ICD-10-CM | POA: Diagnosis not present

## 2021-04-09 DIAGNOSIS — R9389 Abnormal findings on diagnostic imaging of other specified body structures: Secondary | ICD-10-CM

## 2021-04-09 NOTE — Progress Notes (Signed)
Chief Complaint  ?Patient presents with  ? Follow-up  ? Endometrial biospy reveals mucous with benign endocervical glands, I was definitley in the endometrial cavity so the thickness was mucousseen on ? ?Blood pressure 119/78, pulse 61, height '5\' 3"'$  (1.6 m). ? ?Tammy Jensen presents today for routine follow up related to her pessary.   ?She has complete procidentia for many years ?She is fit for Milex ring with support #4, #5 was too large ? ? ?Likert scale(1 not bothersome -5 very bothersome)  :  1 ? ?Exam reveals no undue vaginal mucosal pressure of breakdown, no discharge and no vaginal bleeding. ? ?Vaginal Epithelial Abnormality Classification System:   0 ?0    No abnormalities ?1    Epithelial erythema ?2    Granulation tissue ?3    Epithelial break or erosion, 1 cm or less ?4    Epithelial break or erosion, 1 cm or greater ? ?The pessary is removed, cleaned and replaced without difficulty.   ? ?  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  ?2. Thickened endometrium  R93.89   ? biopsy negative, benign, mucous  ?  ?  ? ?Tammy Jensen will be sen back in 3 months for continued follow up. ? ?Florian Buff, MD  ?04/09/2021 ?4:36 PM ? ? ? ?

## 2021-04-10 ENCOUNTER — Other Ambulatory Visit: Payer: 59 | Admitting: Urology

## 2021-04-16 NOTE — Progress Notes (Signed)
? ?Office Visit Note ? ?Patient: Tammy Jensen             ?Date of Birth: 1950/12/06           ?MRN: [id number]             ?PCP: Alfonse Flavors, MD ?Referring: Alfonse Flavors,* ?Visit Date: 04/24/2021 ?Occupation: '@GUAROCC'$ @ ? ?Subjective:  ?Trochanteric bursitis of both hips  ? ?History of Present Illness: Tammy Jensen is a 71 y.o. female with history of seropositive rheumatoid arthritis.  She is taking plaquenil 200 mg 1 tablet by mouth twice daily.  She continues to tolerate Plaquenil without any side effects and has not missed any doses recently.  She has noticed improvement in the pain and swelling in her hands since starting on Plaquenil.  She states that she is having significant discomfort in both hips due to trochanteric bursitis.  She had cortisone injections in both trochanteric bursa on 12/10/2019 which provided significant relief.  She requested bilateral trochanteric bursa cortisone injections today. ?Patient reports that she was evaluated yesterday by her PCP for ongoing pain in her right shoulder.  She states that her discomfort gradually started about 3 weeks ago with no injury or fall prior to the onset of symptoms.  She states that she has had soreness in her muscle as well as swelling and bruising of the biceps muscle.  She states that the bruising has gradually started to improve.  She states that for several days she had to wear a sling due to the severity of pain.  She has not been evaluated by her orthopedist yet. ?Patient reports that she had updated lab work yesterday with her PCP and had an updated bone density recently. ? ? ? ?Activities of Daily Living:  ?Patient reports morning stiffness for 0 minutes.   ?Patient Reports nocturnal pain.  ?Difficulty dressing/grooming: Denies ?Difficulty climbing stairs: Denies ?Difficulty getting out of chair: Denies ?Difficulty using hands for taps, buttons, cutlery, and/or writing: Denies ? ?Review of Systems  ?Constitutional:   Positive for fatigue.  ?HENT:  Positive for nose dryness. Negative for mouth sores and mouth dryness.   ?Eyes:  Positive for dryness. Negative for pain and itching.  ?Respiratory:  Negative for shortness of breath and difficulty breathing.   ?Cardiovascular:  Negative for chest pain and palpitations.  ?Gastrointestinal:  Negative for blood in stool, constipation and diarrhea.  ?Endocrine: Negative for increased urination.  ?Genitourinary:  Negative for difficulty urinating.  ?Musculoskeletal:  Positive for joint pain, joint pain, myalgias, muscle tenderness and myalgias. Negative for joint swelling and morning stiffness.  ?Skin:  Positive for rash. Negative for color change.  ?Allergic/Immunologic: Negative for susceptible to infections.  ?Neurological:  Negative for dizziness, numbness, headaches, memory loss and weakness.  ?Hematological:  Negative for bruising/bleeding tendency.  ?Psychiatric/Behavioral:  Negative for confusion.   ? ?PMFS History:  ?Patient Active Problem List  ? Diagnosis Date Noted  ? Rheumatoid arthritis with rheumatoid factor of multiple sites without organ or systems involvement ([place]) 09/06/2019  ? High risk medication use 09/06/2019  ? Elevated LFTs 09/06/2019  ? Age-related osteoporosis without current pathological fracture 09/06/2019  ? Dyslipidemia 08/14/2019  ? Abnormal electrocardiogram 03/12/2019  ? Chest pain 03/12/2019  ? HTN (hypertension) 03/12/2019  ? Obesity 03/12/2019  ? Family history of colon cancer 05/28/2016  ?  ?Past Medical History:  ?Diagnosis Date  ? High cholesterol   ? Hypertension   ? IBS (irritable bowel syndrome)   ? Rheumatoid arthritis ([place])   ?  ?  Family History  ?Problem Relation Age of Onset  ? Colon cancer Father   ? Colon cancer Sister   ? Heart disease Mother   ? Dementia Mother   ? Arthritis Mother   ? Hemachromatosis Son   ? Hemachromatosis Son   ? ?Past Surgical History:  ?Procedure Laterality Date  ? COLONOSCOPY N/A 10/07/2016  ? Procedure: COLONOSCOPY;   Surgeon: Rogene Houston, MD;  [facility];  Service: Endoscopy;  Laterality: N/A;  730  ? TUBAL LIGATION    ? ?Social History  ? ?Social History Narrative  ? Not on file  ? ?Immunization History  ?Administered Date(s) Administered  ? PFIZER(Purple Top)SARS-COV-2 Vaccination 09/20/2019, 10/15/2019  ?  ? ?Objective: ?Vital Signs: BP 137/86 (BP Location: Left Arm, Patient Position: Sitting, Cuff Size: Normal)   Pulse 87   Ht '5\' 3"'$  (1.6 m)   Wt 165 lb 9.6 oz (75.1 kg)   BMI 29.33 kg/m?   ? ?Physical Exam ?Vitals and nursing note reviewed.  ?Constitutional:   ?   Appearance: She is well-developed.  ?HENT:  ?   Head: Normocephalic and atraumatic.  ?Eyes:  ?   Conjunctiva/sclera: Conjunctivae normal.  ?Cardiovascular:  ?   Rate and Rhythm: Normal rate and regular rhythm.  ?   Heart sounds: Normal heart sounds.  ?Pulmonary:  ?   Effort: Pulmonary effort is normal.  ?   Breath sounds: Normal breath sounds.  ?Abdominal:  ?   General: Bowel sounds are normal.  ?   Palpations: Abdomen is soft.  ?Musculoskeletal:  ?   Cervical back: Normal range of motion.  ?Skin: ?   General: Skin is warm and dry.  ?   Capillary Refill: Capillary refill takes less than 2 seconds.  ?Neurological:  ?   Mental Status: She is alert and oriented to person, place, and time.  ?Psychiatric:     ?   Behavior: Behavior normal.  ?  ? ?Musculoskeletal Exam: C-spine, thoracic spine, and lumbar spine good ROM.  Midline spinal tenderness in the lumbar region.  No SI joint tenderness.  Discomfort and stiffness of both shoulders.  Tenderness and fullness of the right bipeps.  No erythema, warmth, or fluctuance noted.  Some residual ecchymosis noted.  Elbow joints, wrist joints, MCPs, PIPs, and DIPs good ROM with no synovitis.  PIP and DIP thickening consistent with osteoarthritis of both hands.  Hip joints have good ROM with no groin pain.  Tenderness over bilateral trochanteric bursa.  Knee joints have good ROM with no warmth or effusion.   Ankle joints have good ROM with no tenderness or joint swelling.  ? ?CDAI Exam: ?CDAI Score: -- ?Patient Global: --; Provider Global: -- ?Swollen: --; Tender: -- ?Joint Exam 04/24/2021  ? ?No joint exam has been documented for this visit  ? ?There is currently no information documented on the homunculus. Go to the Rheumatology activity and complete the homunculus joint exam. ? ?Investigation: ?No additional findings. ? ?Imaging: ?No results found. ? ?Recent Labs: ?Lab Results  ?Component Value Date  ? WBC 9.5 04/25/2020  ? HGB 14.6 04/25/2020  ? PLT 273 04/25/2020  ? NA 142 02/11/2021  ? K 3.9 02/11/2021  ? CL 104 02/11/2021  ? CO2 23 02/11/2021  ? GLUCOSE 82 02/11/2021  ? BUN 20 02/11/2021  ? CREATININE 1.00 02/11/2021  ? BILITOT 0.8 04/25/2020  ? ALKPHOS 87 12/03/2017  ? AST 30 04/25/2020  ? ALT 26 04/25/2020  ? PROT 7.8 04/25/2020  ? ALBUMIN  4.3 12/03/2017  ? CALCIUM 9.7 02/11/2021  ? GFRAA 89 04/25/2020  ? ? ?Speciality Comments:  Therisa Doyne WNL 03/31/2020 [facility] ? ?Procedures:  ?Large Joint Inj: bilateral greater trochanter on 04/24/2021 9:59 AM ?Indications: pain ?Details: 27 G 1.5 in needle, lateral approach ? ?Arthrogram: No ? ?Medications (Right): 1.5 mL lidocaine 1 %; 40 mg triamcinolone acetonide 40 MG/ML ?Aspirate (Right): 0 mL ?Medications (Left): 1.5 mL lidocaine 1 %; 40 mg triamcinolone acetonide 40 MG/ML ?Aspirate (Left): 0 mL ?Outcome: tolerated well, no immediate complications ?Procedure, treatment alternatives, risks and benefits explained, specific risks discussed. Consent was given by the patient. Immediately prior to procedure a time out was called to verify the correct patient, procedure, equipment, support staff and site/side marked as required. Patient was prepped and draped in the usual sterile fashion.  ? ? ?Allergies: Patient has no known allergies.  ? ? ?Assessment / Plan:     ?Visit Diagnoses: Rheumatoid arthritis with rheumatoid factor of multiple sites without organ or systems  involvement (HCC) - Positive rheumatoid factor, positive anti-CCP: She has no synovitis on examination today.  She has not had any signs or symptoms of a rheumatoid arthritis flare.  She has clinically been

## 2021-04-24 ENCOUNTER — Ambulatory Visit (INDEPENDENT_AMBULATORY_CARE_PROVIDER_SITE_OTHER): Payer: 59 | Admitting: Physician Assistant

## 2021-04-24 ENCOUNTER — Encounter: Payer: Self-pay | Admitting: Physician Assistant

## 2021-04-24 VITALS — BP 137/86 | HR 87 | Ht 63.0 in | Wt 165.6 lb

## 2021-04-24 DIAGNOSIS — M0579 Rheumatoid arthritis with rheumatoid factor of multiple sites without organ or systems involvement: Secondary | ICD-10-CM | POA: Diagnosis not present

## 2021-04-24 DIAGNOSIS — J302 Other seasonal allergic rhinitis: Secondary | ICD-10-CM

## 2021-04-24 DIAGNOSIS — M7062 Trochanteric bursitis, left hip: Secondary | ICD-10-CM

## 2021-04-24 DIAGNOSIS — I10 Essential (primary) hypertension: Secondary | ICD-10-CM

## 2021-04-24 DIAGNOSIS — M545 Low back pain, unspecified: Secondary | ICD-10-CM

## 2021-04-24 DIAGNOSIS — M810 Age-related osteoporosis without current pathological fracture: Secondary | ICD-10-CM

## 2021-04-24 DIAGNOSIS — R945 Abnormal results of liver function studies: Secondary | ICD-10-CM

## 2021-04-24 DIAGNOSIS — Q667 Congenital pes cavus, unspecified foot: Secondary | ICD-10-CM

## 2021-04-24 DIAGNOSIS — R7989 Other specified abnormal findings of blood chemistry: Secondary | ICD-10-CM

## 2021-04-24 DIAGNOSIS — Z79899 Other long term (current) drug therapy: Secondary | ICD-10-CM | POA: Diagnosis not present

## 2021-04-24 DIAGNOSIS — E785 Hyperlipidemia, unspecified: Secondary | ICD-10-CM

## 2021-04-24 DIAGNOSIS — M25512 Pain in left shoulder: Secondary | ICD-10-CM

## 2021-04-24 DIAGNOSIS — Z8719 Personal history of other diseases of the digestive system: Secondary | ICD-10-CM

## 2021-04-24 DIAGNOSIS — G8929 Other chronic pain: Secondary | ICD-10-CM | POA: Diagnosis not present

## 2021-04-24 DIAGNOSIS — M25511 Pain in right shoulder: Secondary | ICD-10-CM

## 2021-04-24 DIAGNOSIS — M7061 Trochanteric bursitis, right hip: Secondary | ICD-10-CM

## 2021-04-24 MED ORDER — TRIAMCINOLONE ACETONIDE 40 MG/ML IJ SUSP
40.0000 mg | INTRAMUSCULAR | Status: AC | PRN
  Administered 2021-04-24: 40 mg via INTRA_ARTICULAR

## 2021-04-24 MED ORDER — LIDOCAINE HCL 1 % IJ SOLN
1.5000 mL | INTRAMUSCULAR | Status: AC | PRN
  Administered 2021-04-24: 1.5 mL

## 2021-04-29 ENCOUNTER — Ambulatory Visit (INDEPENDENT_AMBULATORY_CARE_PROVIDER_SITE_OTHER): Payer: 59 | Admitting: Physician Assistant

## 2021-04-29 ENCOUNTER — Encounter: Payer: Self-pay | Admitting: Physician Assistant

## 2021-04-29 DIAGNOSIS — D485 Neoplasm of uncertain behavior of skin: Secondary | ICD-10-CM | POA: Diagnosis not present

## 2021-04-29 DIAGNOSIS — L299 Pruritus, unspecified: Secondary | ICD-10-CM | POA: Diagnosis not present

## 2021-04-29 DIAGNOSIS — L570 Actinic keratosis: Secondary | ICD-10-CM

## 2021-04-29 DIAGNOSIS — L820 Inflamed seborrheic keratosis: Secondary | ICD-10-CM | POA: Diagnosis not present

## 2021-04-29 DIAGNOSIS — Z1283 Encounter for screening for malignant neoplasm of skin: Secondary | ICD-10-CM | POA: Diagnosis not present

## 2021-04-29 MED ORDER — TRIAMCINOLONE ACETONIDE 0.1 % EX CREA: 1.0000 "application " | TOPICAL_CREAM | Freq: Two times a day (BID) | CUTANEOUS | 3 refills | Status: DC | PRN

## 2021-04-29 NOTE — Patient Instructions (Signed)

## 2021-04-29 NOTE — Progress Notes (Signed)
? ?New Patient ?  ?Subjective  ?Tammy Jensen is a 71 y.o. female who presents for the following: New Patient (Initial Visit) (Skin check scale on the legs arms and hands x years ). ? ? ?The following portions of the chart were reviewed this encounter and updated as appropriate:  Tobacco  Allergies  Meds  Problems  Med Hx  Surg Hx  Fam Hx   ?  ? ?Objective  ?Well appearing patient in no apparent distress; mood and affect are within normal limits. ? ?A full examination was performed including scalp, head, eyes, ears, nose, lips, neck, chest, axillae, abdomen, back, buttocks, bilateral upper extremities, bilateral lower extremities, hands, feet, fingers, toes, fingernails, and toenails. All findings within normal limits unless otherwise noted below. ? ?No signs of non-mole skin cancer.  ? ? ? ? ?Left Shoulder - Posterior ?Bichromic dark nested macule.  ? ? ? ? ?right upper back ?Pearly papule with telangectasia.  ? ? ? ? ?Left Forearm - Posterior, Left Lower Leg - Anterior, Left Upper Back, Right Forearm - Posterior, Right Lower Leg - Anterior, Right Tip of Nose ?Stuck-on, plaques on an erythematous base. ? ?Left Lower Leg - Anterior, Right Lower Leg - Anterior ?Small brown and pink scales. ? ?Left Lower Leg - Anterior (5), Right Lower Leg - Anterior (3) ?Erythematous patches with gritty scale. ? ? ?Assessment & Plan  ?Screening exam for skin cancer ? ?Yearly skin checks ? ?Neoplasm of uncertain behavior of skin (2) ?Left Shoulder - Posterior ? ?Skin / nail biopsy ?Type of biopsy: tangential   ?Informed consent: discussed and consent obtained   ?Timeout: patient name, date of birth, surgical site, and procedure verified   ?Anesthesia: the lesion was anesthetized in a standard fashion   ?Anesthetic:  1% lidocaine w/ epinephrine 1-100,000 local infiltration ?Instrument used: flexible razor blade   ?Hemostasis achieved with: aluminum chloride and electrodesiccation   ?Outcome: patient tolerated procedure well    ?Post-procedure details: wound care instructions given   ? ?Specimen 1 - Surgical pathology ?Differential Diagnosis: atypia ? ? ?Check Margins: yes ? ?right upper back ? ?Skin / nail biopsy ?Type of biopsy: tangential   ?Informed consent: discussed and consent obtained   ?Timeout: patient name, date of birth, surgical site, and procedure verified   ?Anesthesia: the lesion was anesthetized in a standard fashion   ?Anesthetic:  1% lidocaine w/ epinephrine 1-100,000 local infiltration ?Instrument used: flexible razor blade   ?Hemostasis achieved with: aluminum chloride and electrodesiccation   ?Outcome: patient tolerated procedure well   ?Post-procedure details: wound care instructions given   ? ?Specimen 2 - Surgical pathology ?Differential Diagnosis: bcc scc ? ?Check Margins: No ? ?Seborrheic keratosis, inflamed (6) ?Left Forearm - Posterior; Right Forearm - Posterior; Left Lower Leg - Anterior; Right Lower Leg - Anterior; Left Upper Back; Right Tip of Nose ? ?Destruction of lesion - Left Forearm - Posterior, Left Lower Leg - Anterior, Left Upper Back, Right Forearm - Posterior, Right Lower Leg - Anterior, Right Tip of Nose ?Complexity: simple   ?Destruction method: cryotherapy   ?Informed consent: discussed and consent obtained   ?Timeout:  patient name, date of birth, surgical site, and procedure verified ?Lesion destroyed using liquid nitrogen: Yes   ?Cryotherapy cycles:  4 ?Outcome: patient tolerated procedure well with no complications   ?Post-procedure details: wound care instructions given   ? ?Pruritus (2) ?Left Lower Leg - Anterior; Right Lower Leg - Anterior ? ?triamcinolone cream (KENALOG) 0.1 % - Left Lower Leg -  Anterior, Right Lower Leg - Anterior ?Apply 1 application. topically 2 (two) times daily as needed. ? ?AK (actinic keratosis) (8) ?Left Lower Leg - Anterior (5); Right Lower Leg - Anterior (3) ? ?Destruction of lesion - Left Lower Leg - Anterior, Right Lower Leg - Anterior ?Complexity: simple    ?Destruction method: cryotherapy   ?Informed consent: discussed and consent obtained   ?Timeout:  patient name, date of birth, surgical site, and procedure verified ?Lesion destroyed using liquid nitrogen: Yes   ?Cryotherapy cycles:  1 ?Outcome: patient tolerated procedure well with no complications   ?Post-procedure details: wound care instructions given   ? ? ? ? ?I, Tangee Marszalek, PA-C, have reviewed all documentation's for this visit.  The documentation on 04/29/21 for the exam, diagnosis, procedures and orders are all accurate and complete. ?

## 2021-04-30 ENCOUNTER — Telehealth: Payer: Self-pay | Admitting: *Deleted

## 2021-04-30 NOTE — Telephone Encounter (Signed)
Labs received from:[facility] ? ?Drawn on:04/09/2021 ? ?Reviewed by:Hazel Sams, PA-C ? ?Labs drawn:CMP, Lipid Panel ? ?Results:Triglycerides 267 ? HDL Cholesterol 42 ? Glucose  172 ? BUN 26 ? BUN/Creat. Ratio 31 ? ?Patient on PLQ 200 mg 1 tablet twice daily.  ?

## 2021-05-15 ENCOUNTER — Other Ambulatory Visit (HOSPITAL_COMMUNITY): Payer: Self-pay | Admitting: Family Medicine

## 2021-05-15 DIAGNOSIS — M810 Age-related osteoporosis without current pathological fracture: Secondary | ICD-10-CM

## 2021-05-18 ENCOUNTER — Other Ambulatory Visit (HOSPITAL_COMMUNITY): Payer: Self-pay | Admitting: Family Medicine

## 2021-05-18 DIAGNOSIS — M810 Age-related osteoporosis without current pathological fracture: Secondary | ICD-10-CM

## 2021-05-26 ENCOUNTER — Encounter: Payer: Self-pay | Admitting: Obstetrics & Gynecology

## 2021-05-26 ENCOUNTER — Ambulatory Visit (INDEPENDENT_AMBULATORY_CARE_PROVIDER_SITE_OTHER): Payer: 59 | Admitting: Obstetrics & Gynecology

## 2021-05-26 VITALS — BP 121/71 | HR 90 | Ht 63.0 in | Wt 162.8 lb

## 2021-05-26 DIAGNOSIS — N813 Complete uterovaginal prolapse: Secondary | ICD-10-CM | POA: Diagnosis not present

## 2021-05-26 NOTE — Progress Notes (Signed)
     GYN VISIT Patient name: Tammy Jensen MRN [id number]  Date of birth: [birth year] Chief Complaint:    Chief Complaint  Patient presents with   Pessary Check   History of Present Illness:   Tammy Jensen is a 71 y.o. [phone] PM female being seen today for pessary concerns  Previously seen by Dr. Elonda Husky, fitted for a ring #4.  She presents today because ring came out and she was not able to put it back.  Of note, she thinks it may have been due to straining from helping lift a neighbor.   She did note some bleeding and had to wear a pad the day the device came out.  Prior to this, she was not having any issues since placement in April.  Review of Systems:   Pertinent items are noted in HPI Denies fever/chills, dizziness, headaches, visual disturbances, fatigue, shortness of breath, chest pain, abdominal pain, vomiting, Denies problems with bowel movements, urination.  Not sexually active. Pertinent History Reviewed:  Reviewed past medical,surgical, social, obstetrical and family history.  Reviewed problem list, medications and allergies. Physical Assessment:   Vitals:   05/26/21 1100  BP: 121/71  Pulse: 90  Weight: 162 lb 12.8 oz (73.8 kg)  Height: '5\' 3"'$  (1.6 m)  Body mass index is 28.84 kg/m.       Physical Examination:   General appearance: alert, well appearing, and in no distress  Psych: mood appropriate, normal affect  Skin: warm & dry   Cardiovascular: normal heart rate noted  Respiratory: normal respiratory effort, no distress  Abdomen: soft, non-tender   Exam reveals no undue vaginal mucosal pressure of breakdown, no discharge and no vaginal bleeding.  Complete procidentia noted  Vaginal Epithelial Abnormality Classification System:   0 0    No abnormalities 1    Epithelial erythema 2    Granulation tissue 3    Epithelial break or erosion, 1 cm or less 4    Epithelial break or erosion, 1 cm or greater  Pessary replaced without difficulty   Extremities:  no edema   Chaperone:  Dr. 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  Plan to review surgical options with Dr. Elonda Husky- either to be completed with him or referral to Dr. Wannetta Sender due to degree of prolapse   Return for as scheduled.   Janyth Pupa, DO Attending [place], [facility] for [employer], [place]

## 2021-06-01 ENCOUNTER — Other Ambulatory Visit: Payer: Self-pay | Admitting: Rheumatology

## 2021-06-02 NOTE — Telephone Encounter (Signed)
Next Visit: 10/02/2021  Last Visit: 04/24/2021  Labs: 04/09/2021 Glucose  172 BUN 26 BUN/Creat. 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 Patient states she has completed her eye exam. Patient states she will either mail a copy to the office or drop it by.   Okay to refill Plaquenil?

## 2021-06-24 ENCOUNTER — Other Ambulatory Visit: Payer: Self-pay | Admitting: Physician Assistant

## 2021-06-24 NOTE — Telephone Encounter (Signed)
Next Visit: 10/02/2021   Last Visit: 04/24/2021   Labs: 04/09/2021 Glucose  172 BUN 26 BUN/Creat. 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 Patient states she has completed her eye exam. Patient states she will either mail a copy to the office or drop it by.    Okay to refill Plaquenil?

## 2021-07-09 ENCOUNTER — Encounter: Payer: Self-pay | Admitting: Obstetrics & Gynecology

## 2021-07-09 ENCOUNTER — Ambulatory Visit (INDEPENDENT_AMBULATORY_CARE_PROVIDER_SITE_OTHER): Payer: 59 | Admitting: Obstetrics & Gynecology

## 2021-07-09 VITALS — BP 143/85 | HR 74 | Ht 63.0 in

## 2021-07-09 DIAGNOSIS — N814 Uterovaginal prolapse, unspecified: Secondary | ICD-10-CM

## 2021-07-09 DIAGNOSIS — N813 Complete uterovaginal prolapse: Secondary | ICD-10-CM

## 2021-07-09 NOTE — Progress Notes (Signed)
Follow up appointment for results: Pessary uterine prolapse  Chief Complaint  Patient presents with   Pessary Check    Won't stay in    Blood pressure (!) 143/85, pulse 74, height '5\' 3"'$  (1.6 m).    Have tried #5-->too large #4 won't stay in  No other good solution short of surgery  Recommend TVH VV suspension anterior colporrhpahy  MEDS ordered this encounter: No orders of the defined types were placed in this encounter.   Orders for this encounter: No orders of the defined types were placed in this encounter.   Impression + Management Plan   ICD-10-CM   1. Uterine procidentia: Pessary failure-->recommend TVH, VV suspension, possible anterior colporrhaphy  N81.3     2. Cystocele with uterine prolapse  N81.4       Follow Up: Return in about 14 weeks (around 10/15/2021) for [place], with Dr Elonda Husky.     All questions were answered.  Past Medical History:  Diagnosis Date   High cholesterol    Hypertension    IBS (irritable bowel syndrome)    Rheumatoid arthritis ([place])     Past Surgical History:  Procedure Laterality Date   COLONOSCOPY N/A 10/07/2016   Procedure: COLONOSCOPY;  Surgeon: Rogene Houston, MD;  Location: [facility];  Service: Endoscopy;  Laterality: N/A;  730   TUBAL LIGATION      OB History     Gravida  3   Para  3   Term  3   Preterm      AB      Living  3      SAB      IAB      Ectopic      Multiple      Live Births  3           No Known Allergies  Social History   Socioeconomic History   Marital status: Single    Spouse name: Not on file   Number of children: Not on file   Years of education: Not on file   Highest education level: Not on file  Occupational History   Not on file  Tobacco Use   Smoking status: Never    Passive exposure: Never   Smokeless tobacco: Never  Vaping Use   Vaping Use: Never used  Substance and Sexual Activity   Alcohol use: Not Currently    Comment: occasional   Drug  use: No   Sexual activity: Not Currently    Birth control/protection: Post-menopausal, Surgical    Comment: tubal  Other Topics Concern   Not on file  Social History Narrative   Not on file   Social Determinants of Health   Financial Resource Strain: Low Risk  (04/03/2021)   Overall Financial Resource Strain (CARDIA)    Difficulty of Paying Living Expenses: Not very hard  Food Insecurity: Food Insecurity Present (04/03/2021)   Hunger Vital Sign    Worried About Running Out of Food in the Last Year: Sometimes true    Ran Out of Food in the Last Year: Sometimes true  Transportation Needs: No Transportation Needs (04/03/2021)   PRAPARE - Hydrologist (Medical): No    Lack of Transportation (Non-Medical): No  Physical Activity: Insufficiently Active (04/03/2021)   Exercise Vital Sign    Days of Exercise per Week: 2 days    Minutes of Exercise per Session: 20 min  Stress: No Stress Concern Present (04/03/2021)   [place]  [employer] - Occupational Stress Questionnaire    Feeling of Stress : Only a little  Social Connections: Moderately Integrated (04/03/2021)   Social Connection and Isolation Panel [NHANES]    Frequency of Communication with Friends and Family: More than three times a week    Frequency of Social Gatherings with Friends and Family: Once a week    Attends Religious Services: More than 4 times per year    Active Member of [employer] or Organizations: Yes    Attends Archivist Meetings: 1 to 4 times per year    Marital Status: Divorced    Family History  Problem Relation Age of Onset   Colon cancer Father    Colon cancer Sister    Heart disease Mother    Dementia Mother    Arthritis Mother    Hemachromatosis Son    Hemachromatosis Son

## 2021-07-24 ENCOUNTER — Other Ambulatory Visit: Payer: Self-pay | Admitting: Rheumatology

## 2021-07-29 ENCOUNTER — Ambulatory Visit: Payer: 59 | Admitting: Physician Assistant

## 2021-09-02 HISTORY — PX: RECONSTRUCTION OF EYELID: SHX6576

## 2021-09-16 ENCOUNTER — Encounter (INDEPENDENT_AMBULATORY_CARE_PROVIDER_SITE_OTHER): Payer: Self-pay | Admitting: *Deleted

## 2021-09-28 ENCOUNTER — Encounter (INDEPENDENT_AMBULATORY_CARE_PROVIDER_SITE_OTHER): Payer: Self-pay | Admitting: *Deleted

## 2021-10-02 ENCOUNTER — Ambulatory Visit: Payer: 59 | Admitting: Rheumatology

## 2021-10-02 NOTE — Patient Instructions (Addendum)
Tammy Jensen  10/02/2021     '@[email or web address]'$ @   Your procedure is scheduled on 10/07/21.  Report to [facility] at [place].M.  Call this number if you have problems the morning of surgery:  [phone]   Remember:  Do not eat or drink after midnight.    Take these medicines the morning of surgery with A SIP OF WATER amlodipine, allegra & plaquenil    Do not wear jewelry, make-up or nail polish.  Do not wear lotions, powders, or perfumes, or deodorant.  Do not shave 48 hours prior to surgery.  Men may shave face and neck.  Do not bring valuables to the hospital.  [facility] is not responsible for any belongings or valuables.  Contacts, dentures or bridgework may not be worn into surgery.  Leave your suitcase in the car.  After surgery it may be brought to your room.  For patients admitted to the hospital, discharge time will be determined by your treatment team.  Patients discharged the day of surgery will not be allowed to drive home.   Name and phone number of your driver:   family Special instructions:  Drink all of Ensure drink 4 hours prior to coming to hospital at 4:30am  Please read over the following fact sheets that you were given. Coughing and Deep Breathing, Surgical Site Infection Prevention, Anesthesia Post-op Instructions, and Care and Recovery After Surgery      PATIENT INSTRUCTIONS POST-ANESTHESIA  IMMEDIATELY FOLLOWING SURGERY:  Do not drive or operate machinery for the first twenty four hours after surgery.  Do not make any important decisions for twenty four hours after surgery or while taking narcotic pain medications or sedatives.  If you develop intractable nausea and vomiting or a severe headache please notify your doctor immediately.  FOLLOW-UP:  Please make an appointment with your surgeon as instructed. You do not need to follow up with anesthesia unless specifically instructed to do so.  WOUND CARE INSTRUCTIONS (if applicable):  Keep a  dry clean dressing on the anesthesia/puncture wound site if there is drainage.  Once the wound has quit draining you may leave it open to air.  Generally you should leave the bandage intact for twenty four hours unless there is drainage.  If the epidural site drains for more than 36-48 hours please call the anesthesia department.  QUESTIONS?:  Please feel free to call your physician or the hospital operator if you have any questions, and they will be happy to assist you.      How to Use Chlorhexidine Before Surgery Chlorhexidine gluconate (CHG) is a germ-killing (antiseptic) solution that is used to clean the skin. It can get rid of the bacteria that normally live on the skin and can keep them away for about 24 hours. To clean your skin with CHG, you may be given: A CHG solution to use in the shower or as part of a sponge bath. A prepackaged cloth that contains CHG. Cleaning your skin with CHG may help lower the risk for infection: While you are staying in the intensive care unit of the hospital. If you have a vascular access, such as a central line, to provide short-term or long-term access to your veins. If you have a catheter to drain urine from your bladder. If you are on a ventilator. A ventilator is a machine that helps you breathe by moving air in and out of your lungs. After surgery. What are the risks? Risks of using CHG include: A  skin reaction. Hearing loss, if CHG gets in your ears and you have a perforated eardrum. Eye injury, if CHG gets in your eyes and is not rinsed out. The CHG product catching fire. Make sure that you avoid smoking and flames after applying CHG to your skin. Do not use CHG: If you have a chlorhexidine allergy or have previously reacted to chlorhexidine. On babies younger than 75 months of age. How to use CHG solution Use CHG only as told by your health care provider, and follow the instructions on the label. Use the full amount of CHG as directed. Usually,  this is one bottle. During a shower Follow these steps when using CHG solution during a shower (unless your health care provider gives you different instructions): Start the shower. Use your normal soap and shampoo to wash your face and hair. Turn off the shower or move out of the shower stream. Pour the CHG onto a clean washcloth. Do not use any type of brush or rough-edged sponge. Starting at your neck, lather your body down to your toes. Make sure you follow these instructions: If you will be having surgery, pay special attention to the part of your body where you will be having surgery. Scrub this area for at least 1 minute. Do not use CHG on your head or face. If the solution gets into your ears or eyes, rinse them well with water. Avoid your genital area. Avoid any areas of skin that have broken skin, cuts, or scrapes. Scrub your back and under your arms. Make sure to wash skin folds. Let the lather sit on your skin for 1-2 minutes or as long as told by your health care provider. Thoroughly rinse your entire body in the shower. Make sure that all body creases and crevices are rinsed well. Dry off with a clean towel. Do not put any substances on your body afterward--such as powder, lotion, or perfume--unless you are told to do so by your health care provider. Only use lotions that are recommended by the manufacturer. Put on clean clothes or pajamas. If it is the night before your surgery, sleep in clean sheets.  During a sponge bath Follow these steps when using CHG solution during a sponge bath (unless your health care provider gives you different instructions): Use your normal soap and shampoo to wash your face and hair. Pour the CHG onto a clean washcloth. Starting at your neck, lather your body down to your toes. Make sure you follow these instructions: If you will be having surgery, pay special attention to the part of your body where you will be having surgery. Scrub this area for  at least 1 minute. Do not use CHG on your head or face. If the solution gets into your ears or eyes, rinse them well with water. Avoid your genital area. Avoid any areas of skin that have broken skin, cuts, or scrapes. Scrub your back and under your arms. Make sure to wash skin folds. Let the lather sit on your skin for 1-2 minutes or as long as told by your health care provider. Using a different clean, wet washcloth, thoroughly rinse your entire body. Make sure that all body creases and crevices are rinsed well. Dry off with a clean towel. Do not put any substances on your body afterward--such as powder, lotion, or perfume--unless you are told to do so by your health care provider. Only use lotions that are recommended by the manufacturer. Put on clean clothes or pajamas. If  it is the night before your surgery, sleep in clean sheets. How to use CHG prepackaged cloths Only use CHG cloths as told by your health care provider, and follow the instructions on the label. Use the CHG cloth on clean, dry skin. Do not use the CHG cloth on your head or face unless your health care provider tells you to. When washing with the CHG cloth: Avoid your genital area. Avoid any areas of skin that have broken skin, cuts, or scrapes. Before surgery Follow these steps when using a CHG cloth to clean before surgery (unless your health care provider gives you different instructions): Using the CHG cloth, vigorously scrub the part of your body where you will be having surgery. Scrub using a back-and-forth motion for 3 minutes. The area on your body should be completely wet with CHG when you are done scrubbing. Do not rinse. Discard the cloth and let the area air-dry. Do not put any substances on the area afterward, such as powder, lotion, or perfume. Put on clean clothes or pajamas. If it is the night before your surgery, sleep in clean sheets.  For general bathing Follow these steps when using CHG cloths for  general bathing (unless your health care provider gives you different instructions). Use a separate CHG cloth for each area of your body. Make sure you wash between any folds of skin and between your fingers and toes. Wash your body in the following order, switching to a new cloth after each step: The front of your neck, shoulders, and chest. Both of your arms, under your arms, and your hands. Your stomach and groin area, avoiding the genitals. Your right leg and foot. Your left leg and foot. The back of your neck, your back, and your buttocks. Do not rinse. Discard the cloth and let the area air-dry. Do not put any substances on your body afterward--such as powder, lotion, or perfume--unless you are told to do so by your health care provider. Only use lotions that are recommended by the manufacturer. Put on clean clothes or pajamas. Contact a health care provider if: Your skin gets irritated after scrubbing. You have questions about using your solution or cloth. You swallow any chlorhexidine. Call your local poison control center (1-636-505-0050 in the [place] Get help right away if: Your eyes itch badly, or they become very red or swollen. Your skin itches badly and is red or swollen. Your hearing changes. You have trouble seeing. You have swelling or tingling in your mouth or throat. You have trouble breathing. These symptoms may represent a serious problem that is an emergency. Do not wait to see if the symptoms will go away. Get medical help right away. Call your local emergency services (911 in the [place] Do not drive yourself to the hospital. Summary Chlorhexidine gluconate (CHG) is a germ-killing (antiseptic) solution that is used to clean the skin. Cleaning your skin with CHG may help to lower your risk for infection. You may be given CHG to use for bathing. It may be in a bottle or in a prepackaged cloth to use on your skin. Carefully follow your health care provider's instructions  and the instructions on the product label. Do not use CHG if you have a chlorhexidine allergy. Contact your health care provider if your skin gets irritated after scrubbing. This information is not intended to replace advice given to you by your health care provider. Make sure you discuss any questions you have with your health care provider. Document Revised:  04/20/2021 Document Reviewed: 03/03/2020 Elsevier Patient Education  [place] Anesthesia, Adult General anesthesia is the use of medicine to make you fall asleep (unconscious) for a medical procedure. General anesthesia must be used for certain procedures. It is often recommended for surgery or procedures that: Last a long time. Require you to be still or in an unusual position. Are major and can cause blood loss. Affect your breathing. The medicines used for general anesthesia are called general anesthetics. During general anesthesia, these medicines are given along with medicines that: Prevent pain. Control your blood pressure. Relax your muscles. Prevent nausea and vomiting after the procedure. Tell a health care provider about: Any allergies you have. All medicines you are taking, including vitamins, herbs, eye drops, creams, and over-the-counter medicines. Your history of any: Medical conditions you have, including: High blood pressure. Bleeding problems. Diabetes. Heart or lung conditions, such as: Heart failure. Sleep apnea. Asthma. Chronic obstructive pulmonary disease (COPD). Current or recent illnesses, such as: Upper respiratory, chest, or ear infections. Cough or fever. Tobacco or drug use, including marijuana or alcohol use. Depression or anxiety. Surgeries and types of anesthetics you have had. Problems you or family members have had with anesthetic medicines. Whether you are pregnant or may be pregnant. Whether you have any chipped or loose teeth, dentures, caps, bridgework, or issues with  your mouth, swallowing, or choking. What are the risks? Your health care provider will talk with you about risks. These may include: Allergic reaction to the medicines. Lung and heart problems. Inhaling food or liquid from the stomach into the lungs (aspiration). Nerve injury. Injury to the lips, mouth, teeth, or gums. Stroke. Waking up during your procedure and being unable to move. This is rare. These problems are more likely to develop if you are having a major surgery or if you have an advanced or serious medical condition. You can prevent some of these complications by answering all of your health care provider's questions thoroughly and by following all instructions before your procedure. General anesthesia can cause side effects, including: Nausea or vomiting. A sore throat or hoarseness from the breathing tube. Wheezing or coughing. Shaking chills or feeling cold. Body aches. Sleepiness. Confusion, agitation (delirium), or anxiety. What happens before the procedure? When to stop eating and drinking Follow instructions from your health care provider about what you may eat and drink before your procedure. If you do not follow your health care provider's instructions, your procedure may be delayed or canceled. Medicines Ask your health care provider about: Changing or stopping your regular medicines. These include any diabetes medicines or blood thinners you take. Taking medicines such as aspirin and ibuprofen. These medicines can thin your blood. Do not take them unless your health care provider tells you to. Taking over-the-counter medicines, vitamins, herbs, and supplements. General instructions Do not use any products that contain nicotine or tobacco for at least 4 weeks before the procedure. These products include cigarettes, chewing tobacco, and vaping devices, such as e-cigarettes. If you need help quitting, ask your health care provider. If you brush your teeth on the  morning of the procedure, make sure to spit out all of the water and toothpaste. If told by your health care provider, bring your sleep apnea device with you to surgery (if applicable). If you will be going home right after the procedure, plan to have a responsible adult: Take you home from the hospital or clinic. You will not be allowed to drive. Care for you for  the time you are told. What happens during the procedure?  An IV will be inserted into one of your veins. You will be given one or more of the following through a face mask or IV: A sedative. This helps you relax. Anesthesia. This will: Numb certain areas of your body. Make you fall asleep for surgery. After you are unconscious, a breathing tube may be inserted down your throat to help you breathe. This will be removed before you wake up. An anesthesia provider, such as an anesthesiologist, will stay with you throughout your procedure. The anesthesia provider will: Keep you comfortable and safe by continuing to give you medicines and adjusting the amount of medicine that you get. Monitor your blood pressure, heart rate, and oxygen levels to make sure that the anesthetics do not cause any problems. The procedure may vary among health care providers and hospitals. What happens after the procedure? Your blood pressure, temperature, heart rate, breathing rate, and blood oxygen level will be monitored until you leave the hospital or clinic. You will wake up in a recovery area. You may wake up slowly. You may be given medicine to help you with pain, nausea, or any other side effects from the anesthesia. Summary General anesthesia is the use of medicine to make you fall asleep (unconscious) for a medical procedure. Follow your health care provider's instructions about when to stop eating, drinking, or taking certain medicines before your procedure. Plan to have a responsible adult take you home from the hospital or clinic. This  information is not intended to replace advice given to you by your health care provider. Make sure you discuss any questions you have with your health care provider. Document Revised: 03/19/2021 Document Reviewed: 03/19/2021 Elsevier Patient Education  [place]. Anterior and Posterior Colporrhaphy  Anterior and posterior colporrhaphy are surgical procedures that treat weakness in the front wall (anterior) or back wall (posterior) of the vagina. This weakness can result in a condition called pelvic organ prolapse. Pelvic organ prolapse can cause urine leakage, a condition called incontinence. This surgery is done through incisions in the vagina. You may need this surgery if pelvic organ prolapse causes symptoms that interfere with your daily life and cannot be corrected with other treatments. The type of colporrhaphy that is done depends on the type of prolapse. Types of pelvic organ prolapse include the following: Cystocele. This is a prolapse of the bladder and the upper part of the front wall of the vagina. Rectocele. This is a prolapse of the rectum and the lower part of the back wall of the vagina. Enterocele. This is a prolapse of the small intestine. It appears as a bulge under the neck of the uterus at the top of the back wall of the vagina. Procidentia. This is a complete prolapse of the uterus and the cervix. The prolapse can be seen and felt coming out of the vagina. Tell a health care provider about: Any allergies you have. All medicines you are taking, including vitamins, herbs, eye drops, creams, and over-the-counter medicines. Any problems you or family members have had with anesthetic medicines. Any blood disorders you have. Any surgeries you have had. Any medical conditions you have. Smoking history or history of alcohol use. Whether you are pregnant or may be pregnant. What are the risks? Generally, this is a safe procedure. However, problems may occur,  including: Infection. Bleeding. Allergic reactions to medicines. Damage to nearby structures, organs, or nerves. Problems urinating, or incontinence. Painful sex. Constipation.  A blood clot that can break free and travel to your lungs. What happens before the procedure? Staying hydrated Follow instructions from your health care provider about hydration, which may include: Up to 2 hours before the procedure - you may continue to drink clear liquids, such as water, clear fruit juice, black coffee, and plain tea.  Eating and drinking restrictions Follow instructions from your health care provider about eating and drinking, which may include: 8 hours before the procedure - stop eating heavy meals or foods, such as meat, fried foods, or fatty foods. 6 hours before the procedure - stop eating light meals or foods, such as toast or cereal. 6 hours before the procedure - stop drinking milk or drinks that contain milk. 2 hours before the procedure - stop drinking clear liquids. Medicines Ask your health care provider about: Changing or stopping your regular medicines. This is especially important if you are taking diabetes medicines or blood thinners. Taking medicines, such as aspirin and ibuprofen. These medicines can thin your blood. Do not take these medicines unless your health care provider tells you to take them. Taking over-the-counter medicines, vitamins, herbs, and supplements. Surgery safety Ask your health care provider what steps will be taken to help prevent infection. These steps may include: Removing hair at the surgery site. Washing skin with a germ-killing soap. Taking antibiotic medicine. General instructions You may be told to use estrogen cream in your vagina to help prevent complications and promote healing. Do not use any products that contain nicotine or tobacco for at least 4 weeks before the procedure. These products include cigarettes, chewing tobacco, and vaping  devices, such as e-cigarettes. If you need help quitting, ask your health care provider. Plan to have a responsible adult take you home from the hospital or clinic. Arrange for someone to help you with activities during recovery. Plan to have a responsible adult care for you for the time you are told after you leave the hospital or clinic. This is important. What happens during the procedure? An IV will be inserted into one of your veins. You will be given one or more of the following: A medicine to help you relax (sedative). A medicine to make you fall asleep (general anesthetic). You will lie down on the operating table with your feet in stirrups. A small, thin tube (catheter) will be inserted through your urethra into your bladder to drain urine during surgery and recovery. An instrument (vaginal speculum) will be used to hold your vagina open. Your health care provider will perform the procedure according to the type of repair you require. To perform anterior repair: An incision will be made in the midline section of the front part of the vaginal wall. A triangular-shaped piece of vaginal tissue will be removed. The stronger, healthier tissue will be sewn together to support the bladder. These incisions may be closed with stitches (sutures). Gauze packing will be placed inside your vagina. To perform posterior repair: An incision will be made midline on the back wall of the vagina. A triangular portion of vaginal skin will be removed to expose the muscle. Excess tissue will be removed, and stronger, healthier muscle and ligament tissue will be sewn together to support the rectum or small intestine. These incisions may be closed with sutures. Gauze packing will be placed inside your vagina. To perform anterior and posterior repair: Both procedures will be done during the same surgery. This is done for procidentia prolapse. The procedure may vary among health care  providers and  hospitals. What happens after the procedure? Your blood pressure, heart rate, breathing rate, and blood oxygen level will be monitored until you leave the hospital or clinic. You will be given pain medicine as needed. You will have a catheter in place to drain your bladder. This will be in place until your bladder is working properly on its own. You may have a gauze packing in your vagina for a few days to prevent bleeding. You will be encouraged to eat and drink a regular diet. You will be encouraged to get up and walk as soon as you are able. You may need to wear compression stockings. These stockings help to prevent blood clots and reduce swelling in your legs. Summary Anterior and posterior colporrhaphy are surgical procedures that treat weakness in the front wall (anterior) or back wall (posterior) of the vagina. The type of repair that is done depends on the type of prolapse that is present. Follow instructions from your health care provider about eating and drinking before the procedure. You will be given a general anesthetic to make you fall asleep during the procedure. This information is not intended to replace advice given to you by your health care provider. Make sure you discuss any questions you have with your health care provider. Document Revised: 06/19/2019 Document Reviewed: 06/19/2019 Elsevier Patient Education  [place]. Vaginal Hysterectomy  A vaginal hysterectomy is a procedure to remove all or part of the uterus through a small incision in the vagina. In this procedure, your health care provider may remove your entire uterus, including the cervix. The cervix is the opening and bottom part of the uterus and is located between the vagina and the uterus. Sometimes, the ovaries and fallopian tubes are also removed. This surgery may be done to treat problems such as: Noncancerous growths in the uterus (uterine fibroids) that cause symptoms. A condition that causes  the lining of the uterus to grow in other areas (endometriosis). Problems with pelvic support. Cancer of the cervix, ovaries, uterus, or tissue that lines the uterus (endometrium). Excessive bleeding in the uterus. When removing your uterus, your health care provider may also remove the ovaries and the fallopian tubes. After this procedure, you will no longer be able to have a baby, and you will no longer have a menstrual period. Tell a health care provider about: Any allergies you have. All medicines you are taking, including vitamins, herbs, eye drops, creams, and over-the-counter medicines. Any problems you or family members have had with anesthetic medicines. Any blood disorders you have. Any surgeries you have had. Any medical conditions you have. Whether you are pregnant or may be pregnant. What are the risks? Generally, this is a safe procedure. However, problems may occur, including: Bleeding. Infection. Blood clots in the legs or lungs. Damage to nearby structures or organs. Pain during sex. Allergic reactions to medicines. What happens before the procedure? Staying hydrated Follow instructions from your health care provider about hydration, which may include: Up to 2 hours before the procedure - you may continue to drink clear liquids, such as water, clear fruit juice, black coffee, and plain tea.  Eating and drinking restrictions Follow instructions from your health care provider about eating and drinking, which may include: 8 hours before the procedure - stop eating heavy meals or foods, such as meat, fried foods, or fatty foods. 6 hours before the procedure - stop eating light meals or foods, such as toast or cereal. 6 hours before the  procedure - stop drinking milk or drinks that contain milk. 2 hours before the procedure - stop drinking clear liquids. Medicines Take over-the-counter and prescription medicines only as told by your health care provider. You may be asked  to take a medicine to empty your colon (bowel preparation). General instructions If you were asked to do a bowel preparation before the procedure, follow instructions from your health care provider. This procedure can affect the way you feel about yourself. Talk with your health care provider about the physical and emotional changes hysterectomy may cause. Do not use any products that contain nicotine or tobacco for at least 4 weeks before the procedure. These products include cigarettes, e-cigarettes, and chewing tobacco. If you need help quitting, ask your health care provider. Plan to have a responsible adult take you home from the hospital or clinic. Plan to have a responsible adult care for you for the time you are told after you leave the hospital or clinic. This is important. Surgery safety Ask your health care provider: How your surgery site will be marked. What steps will be taken to help prevent infection. These may include: Removing hair at the surgery site. Washing skin with a germ-killing soap. Receiving antibiotic medicine. What happens during the procedure? An IV will be inserted into one of your veins. You will be given one or more of the following: A medicine to help you relax (sedative). A medicine to numb the area (local anesthetic). A medicine to make you fall asleep (general anesthetic). A medicine that is injected into your spine to numb the area below and slightly above the injection site (spinal anesthetic). A medicine that is injected into an area of your body to numb everything below the injection site (regional anesthetic). Your surgeon will make an incision in your vagina. Your surgeon will locate and remove all or part of your uterus. Part or all of the uterus will be removed through the vagina. Your ovaries and fallopian tubes may be removed at the same time. The incision in your vagina will be closed with stitches (sutures) that dissolve over time. The  procedure may vary among health care providers and hospitals. What happens after the procedure? Your blood pressure, heart rate, breathing rate, and blood oxygen level will be monitored until you leave the hospital or clinic. You will be encouraged to walk as soon as possible. You will also use a device or do breathing exercises to keep your lungs clear. You may have to wear compression stockings. These stockings help to prevent blood clots and reduce swelling in your legs. You will be given pain medicine as needed. You will need to wear a sanitary pad for vaginal discharge or bleeding. Summary A vaginal hysterectomy is a procedure to remove all or part of the uterus through the vagina. You may need a vaginal hysterectomy to treat a variety of abnormalities of the uterus. Plan to have a responsible adult take you home from the hospital or clinic. Plan to have a responsible adult care for you for the time you are told after you leave the hospital or clinic. This is important. This information is not intended to replace advice given to you by your health care provider. Make sure you discuss any questions you have with your health care provider. Document Revised: 03/04/2021 Document Reviewed: 08/24/2019 Elsevier Patient Education  [place].

## 2021-10-04 ENCOUNTER — Other Ambulatory Visit: Payer: Self-pay | Admitting: Obstetrics & Gynecology

## 2021-10-04 DIAGNOSIS — Z01818 Encounter for other preprocedural examination: Secondary | ICD-10-CM

## 2021-10-05 ENCOUNTER — Encounter (HOSPITAL_COMMUNITY)
Admission: RE | Admit: 2021-10-05 | Discharge: 2021-10-05 | Disposition: A | Discharge status: Home / Self Care | Payer: 59 | Source: Ambulatory Visit | Attending: Obstetrics & Gynecology | Admitting: Obstetrics & Gynecology

## 2021-10-05 ENCOUNTER — Encounter (HOSPITAL_COMMUNITY): Payer: Self-pay

## 2021-10-05 VITALS — BP 139/63 | HR 61 | Temp 97.8°F | Resp 18 | Ht 63.0 in | Wt 162.7 lb

## 2021-10-05 DIAGNOSIS — I10 Essential (primary) hypertension: Secondary | ICD-10-CM | POA: Insufficient documentation

## 2021-10-05 DIAGNOSIS — Z01818 Encounter for other preprocedural examination: Secondary | ICD-10-CM | POA: Insufficient documentation

## 2021-10-05 HISTORY — DX: Cardiac arrhythmia, unspecified: I49.9

## 2021-10-05 LAB — TYPE AND SCREEN
ABO/RH(D): O POS
Antibody Screen: NEGATIVE

## 2021-10-05 LAB — COMPREHENSIVE METABOLIC PANEL
ALT: 19 U/L (ref 0–44)
AST: 25 U/L (ref 15–41)
Albumin: 4 g/dL (ref 3.5–5.0)
Alkaline Phosphatase: 53 U/L (ref 38–126)
Anion gap: 7 (ref 5–15)
BUN: 18 mg/dL (ref 8–23)
CO2: 24 mmol/L (ref 22–32)
Calcium: 9 mg/dL (ref 8.9–10.3)
Chloride: 108 mmol/L (ref 98–111)
Creatinine, Ser: 0.76 mg/dL (ref 0.44–1.00)
GFR, Estimated: >60 mL/min (ref >60)
Glucose, Bld: 78 mg/dL (ref 70–99)
Potassium: 3.5 mmol/L (ref 3.5–5.1)
Sodium: 139 mmol/L (ref 135–145)
Total Bilirubin: 1.4 mg/dL — ABNORMAL HIGH (ref 0.3–1.2)
Total Protein: 7.3 g/dL (ref 6.5–8.1)

## 2021-10-05 LAB — CBC
HCT: 44.1 % (ref 36.0–46.0)
Hemoglobin: 14.7 g/dL (ref 12.0–15.0)
MCH: 29.3 pg (ref 26.0–34.0)
MCHC: 33.3 g/dL (ref 30.0–36.0)
MCV: 87.8 fL (ref 80.0–100.0)
Platelets: 219 10*3/uL (ref 150–400)
RBC: 5.02 MIL/uL (ref 3.87–5.11)
RDW: 13.2 % (ref 11.5–15.5)
WBC: 5.6 10*3/uL (ref 4.0–10.5)
nRBC: 0 % (ref 0.0–0.2)

## 2021-10-05 LAB — URINALYSIS, ROUTINE W REFLEX MICROSCOPIC
Bilirubin Urine: NEGATIVE
Glucose, UA: NEGATIVE mg/dL
Hgb urine dipstick: NEGATIVE
Ketones, ur: NEGATIVE mg/dL
Nitrite: NEGATIVE
Protein, ur: NEGATIVE mg/dL
Specific Gravity, Urine: >1.03 — ABNORMAL HIGH (ref 1.005–1.030)
pH: 5.5 (ref 5.0–8.0)

## 2021-10-05 LAB — URINALYSIS, MICROSCOPIC (REFLEX)

## 2021-10-05 LAB — RAPID HIV SCREEN (HIV 1/2 AB+AG)
HIV 1/2 Antibodies: NONREACTIVE
HIV-1 P24 Antigen - HIV24: NONREACTIVE

## 2021-10-07 ENCOUNTER — Ambulatory Visit (HOSPITAL_COMMUNITY)
Admission: RE | Admit: 2021-10-07 | Discharge: 2021-10-07 | Disposition: A | Discharge status: Home / Self Care | Payer: 59 | Attending: Obstetrics & Gynecology | Admitting: Obstetrics & Gynecology

## 2021-10-07 ENCOUNTER — Other Ambulatory Visit: Payer: Self-pay | Admitting: Obstetrics & Gynecology

## 2021-10-07 ENCOUNTER — Other Ambulatory Visit: Payer: Self-pay

## 2021-10-07 ENCOUNTER — Ambulatory Visit (HOSPITAL_COMMUNITY): Payer: 59 | Admitting: Anesthesiology

## 2021-10-07 ENCOUNTER — Encounter (HOSPITAL_COMMUNITY): Payer: Self-pay | Admitting: Obstetrics & Gynecology

## 2021-10-07 ENCOUNTER — Ambulatory Visit (HOSPITAL_BASED_OUTPATIENT_CLINIC_OR_DEPARTMENT_OTHER): Payer: 59 | Admitting: Anesthesiology

## 2021-10-07 ENCOUNTER — Encounter (HOSPITAL_COMMUNITY)
Admission: RE | Disposition: A | Discharge status: Home / Self Care | Payer: Self-pay | Source: Home / Self Care | Attending: Obstetrics & Gynecology

## 2021-10-07 DIAGNOSIS — M069 Rheumatoid arthritis, unspecified: Secondary | ICD-10-CM | POA: Insufficient documentation

## 2021-10-07 DIAGNOSIS — N813 Complete uterovaginal prolapse: Secondary | ICD-10-CM | POA: Insufficient documentation

## 2021-10-07 DIAGNOSIS — D259 Leiomyoma of uterus, unspecified: Secondary | ICD-10-CM | POA: Diagnosis not present

## 2021-10-07 DIAGNOSIS — R9389 Abnormal findings on diagnostic imaging of other specified body structures: Secondary | ICD-10-CM | POA: Diagnosis not present

## 2021-10-07 DIAGNOSIS — Z79899 Other long term (current) drug therapy: Secondary | ICD-10-CM | POA: Insufficient documentation

## 2021-10-07 DIAGNOSIS — N8110 Cystocele, unspecified: Secondary | ICD-10-CM

## 2021-10-07 DIAGNOSIS — I10 Essential (primary) hypertension: Secondary | ICD-10-CM | POA: Diagnosis not present

## 2021-10-07 DIAGNOSIS — N814 Uterovaginal prolapse, unspecified: Secondary | ICD-10-CM

## 2021-10-07 DIAGNOSIS — N888 Other specified noninflammatory disorders of cervix uteri: Secondary | ICD-10-CM | POA: Insufficient documentation

## 2021-10-07 HISTORY — PX: VAGINAL HYSTERECTOMY: SHX2639

## 2021-10-07 SURGERY — HYSTERECTOMY, VAGINAL
Anesthesia: General | Site: Vagina

## 2021-10-07 MED ORDER — PROPOFOL 10 MG/ML IV BOLUS
INTRAVENOUS | Status: DC | PRN
  Administered 2021-10-07: 150 mg via INTRAVENOUS

## 2021-10-07 MED ORDER — HYDROCODONE-ACETAMINOPHEN 5-325 MG PO TABS: 1.0000 | ORAL_TABLET | Freq: Four times a day (QID) | ORAL | 0 refills | Status: DC | PRN

## 2021-10-07 MED ORDER — IBUPROFEN 800 MG PO TABS: 800.0000 mg | ORAL_TABLET | Freq: Three times a day (TID) | ORAL | 0 refills | Status: DC | PRN

## 2021-10-07 MED ORDER — ORAL CARE MOUTH RINSE: 15.0000 mL | Freq: Once | OROMUCOSAL | Status: DC

## 2021-10-07 MED ORDER — POVIDONE-IODINE 10 % EX SWAB: 2.0000 | Freq: Once | CUTANEOUS | Status: DC

## 2021-10-07 MED ORDER — PHENYLEPHRINE 80 MCG/ML (10ML) SYRINGE FOR IV PUSH (FOR BLOOD PRESSURE SUPPORT)
PREFILLED_SYRINGE | INTRAVENOUS | Status: DC | PRN
  Administered 2021-10-07: 160 ug via INTRAVENOUS
  Administered 2021-10-07 (×4): 80 ug via INTRAVENOUS

## 2021-10-07 MED ORDER — LACTATED RINGERS IV SOLN: INTRAVENOUS | Status: DC

## 2021-10-07 MED ORDER — LIDOCAINE HCL (PF) 2 % IJ SOLN
INTRAMUSCULAR | Status: AC
  Filled 2021-10-07: qty 5

## 2021-10-07 MED ORDER — STERILE WATER FOR IRRIGATION IR SOLN
Status: DC | PRN
  Administered 2021-10-07: 1

## 2021-10-07 MED ORDER — ONDANSETRON HCL 4 MG/2ML IJ SOLN
INTRAMUSCULAR | Status: AC
  Filled 2021-10-07: qty 2

## 2021-10-07 MED ORDER — LIDOCAINE 2% (20 MG/ML) 5 ML SYRINGE
INTRAMUSCULAR | Status: DC | PRN
  Administered 2021-10-07: 60 mg via INTRAVENOUS

## 2021-10-07 MED ORDER — KETOROLAC TROMETHAMINE 30 MG/ML IJ SOLN
30.0000 mg | Freq: Once | INTRAMUSCULAR | Status: AC
  Administered 2021-10-07: 30 mg via INTRAVENOUS

## 2021-10-07 MED ORDER — FENTANYL CITRATE (PF) 250 MCG/5ML IJ SOLN
INTRAMUSCULAR | Status: DC | PRN
  Administered 2021-10-07: 25 ug via INTRAVENOUS
  Administered 2021-10-07: 100 ug via INTRAVENOUS
  Administered 2021-10-07: 25 ug via INTRAVENOUS

## 2021-10-07 MED ORDER — HYDROMORPHONE HCL 1 MG/ML IJ SOLN
INTRAMUSCULAR | Status: AC
  Filled 2021-10-07: qty 0.5

## 2021-10-07 MED ORDER — ATROPINE SULFATE 0.4 MG/ML IV SOLN
INTRAVENOUS | Status: AC
  Filled 2021-10-07: qty 1

## 2021-10-07 MED ORDER — DEXAMETHASONE SODIUM PHOSPHATE 10 MG/ML IJ SOLN
INTRAMUSCULAR | Status: AC
  Filled 2021-10-07: qty 1

## 2021-10-07 MED ORDER — BUPIVACAINE-EPINEPHRINE (PF) 0.5% -1:200000 IJ SOLN
INTRAMUSCULAR | Status: AC
  Filled 2021-10-07: qty 30

## 2021-10-07 MED ORDER — DEXAMETHASONE SODIUM PHOSPHATE 10 MG/ML IJ SOLN
INTRAMUSCULAR | Status: DC | PRN
  Administered 2021-10-07: 10 mg via INTRAVENOUS

## 2021-10-07 MED ORDER — EPHEDRINE 5 MG/ML INJ
INTRAVENOUS | Status: AC
  Filled 2021-10-07: qty 5

## 2021-10-07 MED ORDER — EPHEDRINE SULFATE-NACL 50-0.9 MG/10ML-% IV SOSY
PREFILLED_SYRINGE | INTRAVENOUS | Status: DC | PRN
  Administered 2021-10-07 (×2): 5 mg via INTRAVENOUS

## 2021-10-07 MED ORDER — PROPOFOL 10 MG/ML IV BOLUS
INTRAVENOUS | Status: AC
  Filled 2021-10-07: qty 20

## 2021-10-07 MED ORDER — FENTANYL CITRATE (PF) 250 MCG/5ML IJ SOLN
INTRAMUSCULAR | Status: AC
  Filled 2021-10-07: qty 5

## 2021-10-07 MED ORDER — BUPIVACAINE-EPINEPHRINE (PF) 0.5% -1:200000 IJ SOLN
INTRAMUSCULAR | Status: DC | PRN
  Administered 2021-10-07: 21 mL via PERINEURAL

## 2021-10-07 MED ORDER — ONDANSETRON HCL 4 MG/2ML IJ SOLN: 4.0000 mg | Freq: Once | INTRAMUSCULAR | Status: DC | PRN

## 2021-10-07 MED ORDER — CEFAZOLIN SODIUM-DEXTROSE 2-4 GM/100ML-% IV SOLN
2.0000 g | INTRAVENOUS | Status: AC
  Administered 2021-10-07: 2 g via INTRAVENOUS
  Filled 2021-10-07: qty 100

## 2021-10-07 MED ORDER — LACTATED RINGERS IV SOLN: INTRAVENOUS | Status: DC | PRN

## 2021-10-07 MED ORDER — PHENYLEPHRINE 80 MCG/ML (10ML) SYRINGE FOR IV PUSH (FOR BLOOD PRESSURE SUPPORT)
PREFILLED_SYRINGE | INTRAVENOUS | Status: AC
  Filled 2021-10-07: qty 10

## 2021-10-07 MED ORDER — ROCURONIUM BROMIDE 10 MG/ML (PF) SYRINGE
PREFILLED_SYRINGE | INTRAVENOUS | Status: DC | PRN
  Administered 2021-10-07: 50 mg via INTRAVENOUS
  Administered 2021-10-07: 30 mg via INTRAVENOUS

## 2021-10-07 MED ORDER — HYDROMORPHONE HCL 1 MG/ML IJ SOLN
0.2500 mg | INTRAMUSCULAR | Status: DC | PRN
  Administered 2021-10-07 (×2): 0.5 mg via INTRAVENOUS
  Filled 2021-10-07: qty 0.5

## 2021-10-07 MED ORDER — ONDANSETRON HCL 4 MG/2ML IJ SOLN
INTRAMUSCULAR | Status: DC | PRN
  Administered 2021-10-07: 4 mg via INTRAVENOUS

## 2021-10-07 MED ORDER — CHLORHEXIDINE GLUCONATE 0.12 % MT SOLN: 15.0000 mL | Freq: Once | OROMUCOSAL | Status: DC

## 2021-10-07 MED ORDER — SUGAMMADEX SODIUM 200 MG/2ML IV SOLN
INTRAVENOUS | Status: DC | PRN
  Administered 2021-10-07: 200 mg via INTRAVENOUS

## 2021-10-07 MED ORDER — KETOROLAC TROMETHAMINE 30 MG/ML IJ SOLN
INTRAMUSCULAR | Status: AC
  Filled 2021-10-07: qty 1

## 2021-10-07 MED ORDER — ONDANSETRON 8 MG PO TBDP: 8.0000 mg | ORAL_TABLET | Freq: Three times a day (TID) | ORAL | 0 refills | Status: DC | PRN

## 2021-10-07 MED ORDER — 0.9 % SODIUM CHLORIDE (POUR BTL) OPTIME
TOPICAL | Status: DC | PRN
  Administered 2021-10-07: 1000 mL

## 2021-10-07 MED ORDER — ROCURONIUM BROMIDE 10 MG/ML (PF) SYRINGE
PREFILLED_SYRINGE | INTRAVENOUS | Status: AC
  Filled 2021-10-07: qty 10

## 2021-10-07 SURGICAL SUPPLY — 35 items
BAG HAMPER (MISCELLANEOUS) ×2 IMPLANT
CLOTH BEACON ORANGE TIMEOUT ST (SAFETY) ×2 IMPLANT
COVER LIGHT HANDLE STERIS (MISCELLANEOUS) ×4 IMPLANT
DECANTER SPIKE VIAL GLASS SM (MISCELLANEOUS) ×2 IMPLANT
DRAPE HALF SHEET 40X57 (DRAPES) ×2 IMPLANT
DRAPE STERI URO 9X17 APER PCH (DRAPES) ×2 IMPLANT
ELECT REM PT RETURN 9FT ADLT (ELECTROSURGICAL) ×2
ELECTRODE REM PT RTRN 9FT ADLT (ELECTROSURGICAL) ×2 IMPLANT
GAUZE 4X4 16PLY ~~LOC~~+RFID DBL (SPONGE) ×2 IMPLANT
GLOVE BIOGEL PI IND STRL 7.0 (GLOVE) ×4 IMPLANT
GLOVE BIOGEL PI IND STRL 8 (GLOVE) ×2 IMPLANT
GLOVE ECLIPSE 8.0 STRL XLNG CF (GLOVE) ×4 IMPLANT
GLOVE SRG 8 PF TXTR STRL LF DI (GLOVE) ×2 IMPLANT
GLOVE SURG UNDER POLY LF SZ8 (GLOVE) ×2
GOWN STRL REUS W/TWL LRG LVL3 (GOWN DISPOSABLE) ×4 IMPLANT
GOWN STRL REUS W/TWL XL LVL3 (GOWN DISPOSABLE) ×2 IMPLANT
IV NS IRRIG 3000ML ARTHROMATIC (IV SOLUTION) ×2 IMPLANT
KIT BLADEGUARD II DBL (SET/KITS/TRAYS/PACK) ×2 IMPLANT
KIT TURNOVER CYSTO (KITS) ×2 IMPLANT
KIT TURNOVER KIT A (KITS) ×2 IMPLANT
MANIFOLD NEPTUNE II (INSTRUMENTS) ×2 IMPLANT
NDL HYPO 21X1.5 SAFETY (NEEDLE) ×1 IMPLANT
NEEDLE HYPO 21X1.5 SAFETY (NEEDLE) ×2 IMPLANT
NS IRRIG 1000ML POUR BTL (IV SOLUTION) ×2 IMPLANT
PACK PERI GYN (CUSTOM PROCEDURE TRAY) ×2 IMPLANT
PAD ARMBOARD 7.5X6 YLW CONV (MISCELLANEOUS) ×2 IMPLANT
SET BASIN LINEN APH (SET/KITS/TRAYS/PACK) ×2 IMPLANT
SPONGE T-LAP 4X18 ~~LOC~~+RFID (SPONGE) ×1 IMPLANT
SUT VIC AB 0 CT1 27 (SUTURE) ×6
SUT VIC AB 0 CT1 27XCR 8 STRN (SUTURE) ×5 IMPLANT
SUT VIC AB 0 CT2 8-18 (SUTURE) ×2 IMPLANT
SYR CONTROL 10ML LL (SYRINGE) ×2 IMPLANT
TRAY FOLEY W/BAG SLVR 16FR (SET/KITS/TRAYS/PACK) ×2
TRAY FOLEY W/BAG SLVR 16FR ST (SET/KITS/TRAYS/PACK) ×2 IMPLANT
WATER STERILE IRR 1000ML POUR (IV SOLUTION) ×2 IMPLANT

## 2021-10-07 NOTE — H&P (Signed)
Preoperative History and Physical  Tammy Jensen is a 71 y.o. G3P3003 with No LMP recorded. Patient is postmenopausal. admitted for a TVH vaginal vault suspension and possible anterior colporrhaphy.  We tried over the past few months to fit for pessary Milex ring with support #5 is too large #4 won't stay in Only solution is surgical correction  PMH:    Past Medical History:  Diagnosis Date   Dysrhythmia    High cholesterol    Hypertension    IBS (irritable bowel syndrome)    Rheumatoid arthritis (HCC)     PSH:     Past Surgical History:  Procedure Laterality Date   COLONOSCOPY N/A 10/07/2016   Procedure: COLONOSCOPY;  Surgeon: Rogene Houston, MD;  Location: [facility];  Service: Endoscopy;  Laterality: N/A;  730   TUBAL LIGATION      POb/GynH:      OB History     Gravida  3   Para  3   Term  3   Preterm      AB      Living  3      SAB      IAB      Ectopic      Multiple      Live Births  3           SH:   Social History   Tobacco Use   Smoking status: Never    Passive exposure: Never   Smokeless tobacco: Never  Vaping Use   Vaping Use: Never used  Substance Use Topics   Alcohol use: Not Currently    Comment: occasional   Drug use: No    FH:    Family History  Problem Relation Age of Onset   Colon cancer Father    Colon cancer Sister    Heart disease Mother    Dementia Mother    Arthritis Mother    Hemachromatosis Son    Hemachromatosis Son      Allergies: No Known Allergies  Medications:       Current Facility-Administered Medications:    ceFAZolin (ANCEF) IVPB 2g/100 mL premix, 2 g, Intravenous, On Call to OR, Florian Buff, MD   ketorolac (TORADOL) 30 MG/ML injection 30 mg, 30 mg, Intravenous, Once, Florian Buff, MD   ketorolac (TORADOL) 30 MG/ML injection, , , ,    povidone-iodine 10 % swab 2 Application, 2 Application, Topical, Once, Florian Buff, MD  Review of Systems:   Review of Systems   Constitutional: Negative for fever, chills, weight loss, malaise/fatigue and diaphoresis.  HENT: Negative for hearing loss, ear pain, nosebleeds, congestion, sore throat, neck pain, tinnitus and ear discharge.   Eyes: Negative for blurred vision, double vision, photophobia, pain, discharge and redness.  Respiratory: Negative for cough, hemoptysis, sputum production, shortness of breath, wheezing and stridor.   Cardiovascular: Negative for chest pain, palpitations, orthopnea, claudication, leg swelling and PND.  Gastrointestinal: Positive for abdominal pain. Negative for heartburn, nausea, vomiting, diarrhea, constipation, blood in stool and melena.  Genitourinary: Negative for dysuria, urgency, frequency, hematuria and flank pain.  Musculoskeletal: Negative for myalgias, back pain, joint pain and falls.  Skin: Negative for itching and rash.  Neurological: Negative for dizziness, tingling, tremors, sensory change, speech change, focal weakness, seizures, loss of consciousness, weakness and headaches.  Endo/Heme/Allergies: Negative for environmental allergies and polydipsia. Does not bruise/bleed easily.  Psychiatric/Behavioral: Negative for depression, suicidal ideas, hallucinations, memory loss and substance abuse. The patient is not  nervous/anxious and does not have insomnia.      PHYSICAL EXAM:  Blood pressure (!) 166/85, pulse 75, temperature 97.8 F (36.6 C), temperature source Oral, resp. rate 18, height '5\' 3"'$  (1.6 m), weight 73.8 kg, SpO2 100 %.    Vitals reviewed. Constitutional: She is oriented to person, place, and time. She appears well-developed and well-nourished.  HENT:  Head: Normocephalic and atraumatic.  Right Ear: External ear normal.  Left Ear: External ear normal.  Nose: Nose normal.  Mouth/Throat: Oropharynx is clear and moist.  Eyes: Conjunctivae and EOM are normal. Pupils are equal, round, and reactive to light. Right eye exhibits no discharge. Left eye exhibits  no discharge. No scleral icterus.  Neck: Normal range of motion. Neck supple. No tracheal deviation present. No thyromegaly present.  Cardiovascular: Normal rate, regular rhythm, normal heart sounds and intact distal pulses.  Exam reveals no gallop and no friction rub.   No murmur heard. Respiratory: Effort normal and breath sounds normal. No respiratory distress. She has no wheezes. She has no rales. She exhibits no tenderness.  GI: Soft. Bowel sounds are normal. She exhibits no distension and no mass. There is tenderness. There is no rebound and no guarding.  Genitourinary:       Vulva is normal without lesions Vagina is pink moist without discharge Cervix normal in appearance and pap is normal Uterus is procidentia normal size shape contour Adnexa is negative with normal sized ovaries by sonogram  Musculoskeletal: Normal range of motion. She exhibits no edema and no tenderness.  Neurological: She is alert and oriented to person, place, and time. She has normal reflexes. She displays normal reflexes. No cranial nerve deficit. She exhibits normal muscle tone. Coordination normal.  Skin: Skin is warm and dry. No rash noted. No erythema. No pallor.  Psychiatric: She has a normal mood and affect. Her behavior is normal. Judgment and thought content normal.    Labs: Results for orders placed or performed during the hospital encounter of 10/05/21 (from the past 336 hour(s))  CBC   Collection Time: 10/05/21  9:10 AM  Result Value Ref Range   WBC 5.6 4.0 - 10.5 K/uL   RBC 5.02 3.87 - 5.11 MIL/uL   Hemoglobin 14.7 12.0 - 15.0 g/dL   HCT 44.1 36.0 - 46.0 %   MCV 87.8 80.0 - 100.0 fL   MCH 29.3 26.0 - 34.0 pg   MCHC 33.3 30.0 - 36.0 g/dL   RDW 13.2 11.5 - 15.5 %   Platelets 219 150 - 400 K/uL   nRBC 0.0 0.0 - 0.2 %  Comprehensive metabolic panel   Collection Time: 10/05/21  9:10 AM  Result Value Ref Range   Sodium 139 135 - 145 mmol/L   Potassium 3.5 3.5 - 5.1 mmol/L   Chloride 108 98 -  111 mmol/L   CO2 24 22 - 32 mmol/L   Glucose, Bld 78 70 - 99 mg/dL   BUN 18 8 - 23 mg/dL   Creatinine, Ser 0.76 0.44 - 1.00 mg/dL   Calcium 9.0 8.9 - 10.3 mg/dL   Total Protein 7.3 6.5 - 8.1 g/dL   Albumin 4.0 3.5 - 5.0 g/dL   AST 25 15 - 41 U/L   ALT 19 0 - 44 U/L   Alkaline Phosphatase 53 38 - 126 U/L   Total Bilirubin 1.4 (H) 0.3 - 1.2 mg/dL   GFR, Estimated >60 >60 mL/min   Anion gap 7 5 - 15  Rapid HIV screen (HIV 1/2  Ab+Ag)   Collection Time: 10/05/21  9:10 AM  Result Value Ref Range   HIV-1 P24 Antigen - HIV24 NON REACTIVE NON REACTIVE   HIV 1/2 Antibodies NON REACTIVE NON REACTIVE   Interpretation (HIV Ag Ab)      A non reactive test result means that HIV 1 or HIV 2 antibodies and HIV 1 p24 antigen were not detected in the specimen.  Urinalysis, Routine w reflex microscopic Urine, Clean Catch   Collection Time: 10/05/21  9:10 AM  Result Value Ref Range   Color, Urine YELLOW YELLOW   APPearance CLEAR CLEAR   Specific Gravity, Urine >1.030 (H) 1.005 - 1.030   pH 5.5 5.0 - 8.0   Glucose, UA NEGATIVE NEGATIVE mg/dL   Hgb urine dipstick NEGATIVE NEGATIVE   Bilirubin Urine NEGATIVE NEGATIVE   Ketones, ur NEGATIVE NEGATIVE mg/dL   Protein, ur NEGATIVE NEGATIVE mg/dL   Nitrite NEGATIVE NEGATIVE   Leukocytes,Ua SMALL (A) NEGATIVE  Urinalysis, Microscopic (reflex)   Collection Time: 10/05/21  9:10 AM  Result Value Ref Range   RBC / HPF 0-5 0 - 5 RBC/hpf   WBC, UA 11-20 0 - 5 WBC/hpf   Bacteria, UA FEW (A) NONE SEEN   Squamous Epithelial / LPF 0-5 0 - 5   Non Squamous Epithelial PRESENT (A) NONE SEEN   Hyaline Casts, UA PRESENT    WBC Casts, UA PRESENT    Ca Oxalate Crys, UA PRESENT   Type and screen   Collection Time: 10/05/21  9:10 AM  Result Value Ref Range   ABO/RH(D) O POS    Antibody Screen NEG    Sample Expiration 10/19/2021,2359    Extend sample reason      NO TRANSFUSIONS OR PREGNANCY IN THE PAST 3 MONTHS Performed at [facility], [address],  [place], [place] [address]     EKG: Orders placed or performed during the hospital encounter of 10/05/21   EKG 12-LEAD   EKG 12-LEAD   EKG 12-Lead   EKG 12-Lead    Imaging Studies: No results found.    Assessment: Uterine procidentia Grade 2 cytocoele Pessary with different sizes ineffective in symptom relief  Plan: TVH vaginal vault suspension possible anterior colporrhaphy, depending on antomy post VV suspension  Pt understands the risks of surgery including but not limited t  excessive bleeding requiring transfusion or reoperation, post-operative infection requiring prolonged hospitalization or re-hospitalization and antibiotic therapy, and damage to other organs including bladder, bowel, ureters and major vessels.  The patient also understands the alternative treatment options which were discussed in full.  All questions were answered.  Florian Buff 10/07/2021 9:18 AM   Tammy Jensen 10/07/2021 9:15 AM

## 2021-10-07 NOTE — Anesthesia Preprocedure Evaluation (Addendum)
Anesthesia Evaluation  Patient identified by MRN, date of birth, ID band Patient awake    Reviewed: Allergy & Precautions, NPO status , Patient's Chart, lab work & pertinent test results  Airway Mallampati: II  TM Distance: >3 FB Neck ROM: Full    Dental  (+) Dental Advisory Given, Missing, Caps   Pulmonary neg pulmonary ROS,    Pulmonary exam normal breath sounds clear to auscultation       Cardiovascular Exercise Tolerance: Good hypertension, Pt. on medications + dysrhythmias  Rhythm:Irregular Rate:Normal + Systolic murmurs [id number] 09:09:02 [facility]-AP-300 ROUTINE RECORD 05-10-1950 (53 yr) Female Caucasian Vent. rate 61 BPM PR interval 186 ms QRS duration 72 ms QT/QTcB 418/420 ms P-R-T axes 43 -12 -1 Normal sinus rhythm Low voltage QRS Nonspecific T wave abnormality Abnormal ECG No previous ECGs available Confirmed by Cristopher Peru [phone]) on 10/05/2021 8:54:58 PM   Neuro/Psych negative neurological ROS  negative psych ROS   GI/Hepatic negative GI ROS, Neg liver ROS,   Endo/Other  negative endocrine ROS  Renal/GU negative Renal ROS  negative genitourinary   Musculoskeletal  (+) Arthritis , Osteoarthritis and Rheumatoid disorders,    Abdominal   Peds negative pediatric ROS (+)  Hematology negative hematology ROS (+)   Anesthesia Other Findings   Reproductive/Obstetrics negative OB ROS                            Anesthesia Physical Anesthesia Plan  ASA: 2  Anesthesia Plan: General   Post-op Pain Management: Dilaudid IV   Induction: Intravenous  PONV Risk Score and Plan: 4 or greater and Ondansetron and Dexamethasone  Airway Management Planned: Oral ETT  Additional Equipment:   Intra-op Plan:   Post-operative Plan: Extubation in OR  Informed Consent: I have reviewed the patients History and Physical, chart, labs and discussed the procedure  including the risks, benefits and alternatives for the proposed anesthesia with the patient or authorized representative who has indicated his/her understanding and acceptance.     Dental advisory given  Plan Discussed with: CRNA and Surgeon  Anesthesia Plan Comments:         Anesthesia Quick Evaluation

## 2021-10-07 NOTE — Anesthesia Postprocedure Evaluation (Signed)
Anesthesia Post Note  Patient: Tammy Jensen  Procedure(s) Performed: HYSTERECTOMY VAGINAL (Vagina )  Patient location during evaluation: Phase II Anesthesia Type: General Level of consciousness: awake and alert and oriented Pain management: pain level controlled Vital Signs Assessment: post-procedure vital signs reviewed and stable Respiratory status: spontaneous breathing, nonlabored ventilation and respiratory function stable Cardiovascular status: blood pressure returned to baseline and stable Postop Assessment: no apparent nausea or vomiting Anesthetic complications: no   No notable events documented.   Last Vitals:  Vitals:   10/07/21 1300 10/07/21 1320  BP: 129/68 (!) 139/90  Pulse: 80 85  Resp: 11 20  Temp:  36.8 C  SpO2: 92% 97%    Last Pain:  Vitals:   10/07/21 1320  TempSrc:   PainSc: 4                  Harve Spradley C [place]

## 2021-10-07 NOTE — Op Note (Signed)
Preoperative diagnosis:  1.  Uterine procidentia                                         2.  Unable to manage with pessary                                         3.  Cystocoele, secondary to procidentia, hard to tell how much is specific to the anterior compartment                                         4.    Postoperative diagnosis:  Same as above   Procedure:  Vaginal hysterectomy with vaginal vault suspension  Surgeon:  Florian Buff MD  Anesthesia:  General Endotracheal  Findings:  uterine procidentia, ovaries small barely visualized, cystocoele was minimal after fixing the apical defect    Description of operation:  The patient was taken from the preoperative area to the operating room in stable condition. She underwent GET anesthesia ands  she was placed in the dorsal lithotomy position. Patient was prepped and draped in the usual sterile fashion and a Foley catheter was placed.  A weighted speculum was placed and the cervix was grasped with thyroid tenaculums both anteriorly and posteriorly.  0.5% Marcainewith 1/200,000 epinephrine was injected in a circumferential fashion about the cervix and the electrocautery unit was used to incise the vagina and push at all cervix.  The posterior cul-de-sac was then entered sharply without difficulty.  The uterosacral ligaments were clamped cut and inspection suture ligated and held.  The cardinal ligaments were then clamped cut transfixion suture ligated and cut. The anterior peritoneum was identified the anterior cul-de-sac was entered sharply without difficulty. The anterior and posterior leaves of the broad ligament were plicated and the uterine vessels were clamped cut and suture ligated. Serial pedicles were taken of the fundus with each pedicle being clamped cut and suture ligated. The utero-ovarian ligaments were crossclamped the uterus was removed and both pedicles were transfixion suture ligated. There was good hemostasis of all the pedicles.   The anterior posterior vagina and peritoneum were closed in interrupted fashion with good resultant hemostasis. Prior to  closure the lower pelvis and vagina were irrigated vigorously.  The sponge needle and instrument counts were correct x 3.  Total blood loss for the procedure was 50 cc.  The patient received 2 g of Ancef and 30 mg of Toradol IV preoperatively prophylactically.  She was taken to the recovery room in good stable condition awake alert doing well.  Florian Buff 10/07/2021, 12:35 PM

## 2021-10-07 NOTE — Anesthesia Procedure Notes (Addendum)
Procedure Name: Intubation Date/Time: 10/07/2021 10:14 AM  Performed by: Hewitt Blade, CRNAPre-anesthesia Checklist: Patient identified, Emergency Drugs available, Suction available and Patient being monitored Patient Re-evaluated:Patient Re-evaluated prior to induction Oxygen Delivery Method: Circle system utilized Preoxygenation: Pre-oxygenation with 100% oxygen Induction Type: IV induction Ventilation: Mask ventilation without difficulty Laryngoscope Size: Mac and 3 Grade View: Grade I Tube type: Oral Tube size: 7.0 mm Number of attempts: 1 Airway Equipment and Method: Stylet and Bite block Placement Confirmation: ETT inserted through vocal cords under direct vision, positive ETCO2 and breath sounds checked- equal and bilateral Secured at: 21 cm Tube secured with: Tape Dental Injury: Teeth and Oropharynx as per pre-operative assessment

## 2021-10-07 NOTE — Transfer of Care (Signed)
Immediate Anesthesia Transfer of Care Note  Patient: Tammy Jensen  Procedure(s) Performed: HYSTERECTOMY VAGINAL (Vagina )  Patient Location: PACU  Anesthesia Type:General  Level of Consciousness: awake, alert , oriented and patient cooperative  Airway & Oxygen Therapy: Patient Spontanous Breathing and Patient connected to nasal cannula oxygen  Post-op Assessment: Report given to RN, Post -op Vital signs reviewed and stable and Patient moving all extremities X 4  Post vital signs: Reviewed and stable  Last Vitals:  Vitals Value Taken Time  BP 139/73 10/07/21 1208  Temp    Pulse 86 10/07/21 1211  Resp 15 10/07/21 1211  SpO2 98 % 10/07/21 1211  Vitals shown include unvalidated device data.  Last Pain:  Vitals:   10/07/21 0911  TempSrc: Oral  PainSc: 7          Complications: No notable events documented.

## 2021-10-08 LAB — SURGICAL PATHOLOGY

## 2021-10-09 ENCOUNTER — Telehealth: Payer: Self-pay | Admitting: Obstetrics & Gynecology

## 2021-10-09 NOTE — Telephone Encounter (Signed)
Returned patient's call.  States she had a normal bowel movement yesterday but today was harder to pass and painful.  Had intense pain afterwards but is since better. Advised patient she may need a stool sofner daily as to avoid straining when having a BM.  Also to increase fluids.  Pt verbalized understanding with no further questions.

## 2021-10-09 NOTE — Telephone Encounter (Signed)
Pt has had a painful bowel movement is this normal? Please advise.

## 2021-10-13 ENCOUNTER — Encounter (HOSPITAL_COMMUNITY): Payer: Self-pay | Admitting: Obstetrics & Gynecology

## 2021-10-15 ENCOUNTER — Encounter: Payer: 59 | Admitting: Obstetrics & Gynecology

## 2021-10-20 ENCOUNTER — Ambulatory Visit (INDEPENDENT_AMBULATORY_CARE_PROVIDER_SITE_OTHER): Payer: 59 | Admitting: Obstetrics & Gynecology

## 2021-10-20 ENCOUNTER — Encounter: Payer: Self-pay | Admitting: Obstetrics & Gynecology

## 2021-10-20 VITALS — BP 142/93 | HR 67 | Ht 63.0 in | Wt 164.0 lb

## 2021-10-20 DIAGNOSIS — Z9889 Other specified postprocedural states: Secondary | ICD-10-CM

## 2021-10-20 NOTE — Progress Notes (Signed)
  HPI: Patient returns for routine postoperative follow-up having undergone TVH VV suspension on 10/07/21.  The patient's immediate postoperative recovery has been unremarkable. Since hospital discharge the patient reports no problems.   Current Outpatient Medications: amLODipine (NORVASC) 5 MG tablet, Take 5 mg by mouth daily. , Disp: , Rfl:  ARTIFICIAL TEAR SOLUTION OP, Place 1 drop into both eyes daily as needed (dry eyes)., Disp: , Rfl:  aspirin 81 MG tablet, Take 81 mg by mouth daily., Disp: , Rfl:  benazepril (LOTENSIN) 40 MG tablet, Take 40 mg by mouth daily., Disp: , Rfl:  Calcium Carb-Cholecalciferol (CALCIUM 600 + D PO), Take 1 tablet by mouth daily., Disp: , Rfl:  Cholecalciferol 50 MCG (2000 UT) TABS, Take 2,000 Units by mouth daily., Disp: , Rfl:  cyanocobalamin (,VITAMIN B-12,) 1000 MCG/ML injection, Inject 1,000 mcg into the muscle every 30 (thirty) days., Disp: , Rfl:  fexofenadine (ALLEGRA ALLERGY) 180 MG tablet, Take 180 mg by mouth daily as needed for allergies or rhinitis., Disp: , Rfl:  fluticasone (FLONASE) 50 MCG/ACT nasal spray, Place into both nostrils daily as needed for allergies or rhinitis., Disp: , Rfl:  furosemide (LASIX) 20 MG tablet, Take 20 mg by mouth every other day., Disp: , Rfl:  hydroxychloroquine (PLAQUENIL) 200 MG tablet, Take 1 tablet by mouth twice daily, Disp: 60 tablet, Rfl: 0 ibuprofen (ADVIL) 800 MG tablet, Take 1 tablet (800 mg total) by mouth every 8 (eight) hours as needed., Disp: 30 tablet, Rfl: 0 ondansetron (ZOFRAN-ODT) 8 MG disintegrating tablet, Take 1 tablet (8 mg total) by mouth every 8 (eight) hours as needed for nausea or vomiting., Disp: 8 tablet, Rfl: 0 rosuvastatin (CRESTOR) 10 MG tablet, Take 10 mg by mouth daily., Disp: , Rfl:  traZODone (DESYREL) 50 MG tablet, Take 50 mg by mouth at bedtime as needed for sleep., Disp: , Rfl:  triamcinolone cream (KENALOG) 0.1 %, Apply 1 application. topically 2 (two) times daily as needed., Disp:  454 g, Rfl: 3 HYDROcodone-acetaminophen (NORCO/VICODIN) 5-325 MG tablet, Take 1-2 tablets by mouth every 6 (six) hours as needed. Maximum of 5 per day (Patient not taking: Reported on 10/20/2021), Disp: 30 tablet, Rfl: 0  No current facility-administered medications for this visit.    Blood pressure (!) 142/93, pulse 67, height '5\' 3"'$  (1.6 m), weight 164 lb (74.4 kg).  Physical Exam: Normal vaginal exam Sutures in place Small amount of spotting  Diagnostic Tests:   Pathology: benign  Impression + Management plan:   ICD-10-CM   1. S/P TVH, vaginal vault suspension 10/07/21  Z98.890          Medications Prescribed this encounter: No orders of the defined types were placed in this encounter.     Follow up: Return in about 5 weeks (around 11/24/2021) for [place], with Dr Elonda Husky.    Florian Buff, MD Attending Physician for the [facility] 10/20/2021 10:55 AM

## 2021-11-20 ENCOUNTER — Ambulatory Visit (INDEPENDENT_AMBULATORY_CARE_PROVIDER_SITE_OTHER): Payer: 59 | Admitting: Obstetrics & Gynecology

## 2021-11-20 ENCOUNTER — Encounter: Payer: Self-pay | Admitting: Obstetrics & Gynecology

## 2021-11-20 VITALS — BP 128/85 | HR 68 | Ht 63.0 in | Wt 162.0 lb

## 2021-11-20 DIAGNOSIS — R809 Proteinuria, unspecified: Secondary | ICD-10-CM | POA: Diagnosis not present

## 2021-11-20 DIAGNOSIS — Z9889 Other specified postprocedural states: Secondary | ICD-10-CM

## 2021-11-20 LAB — POCT URINALYSIS DIPSTICK
Blood, UA: NEGATIVE
Glucose, UA: NEGATIVE
Ketones, UA: NEGATIVE
Leukocytes, UA: NEGATIVE
Nitrite, UA: NEGATIVE
Protein, UA: POSITIVE — AB

## 2021-11-20 MED ORDER — METRONIDAZOLE 0.75 % VA GEL: 1.0000 | Freq: Every day | VAGINAL | 5 refills | Status: DC

## 2021-11-20 NOTE — Progress Notes (Signed)
  HPI: Patient returns for routine postoperative follow-up having undergone TVH VV suspension on 10/07/21.  The patient's immediate postoperative recovery has been unremarkable. Since hospital discharge the patient reports some odor discharge.   Current Outpatient Medications: amLODipine (NORVASC) 5 MG tablet, Take 5 mg by mouth daily. , Disp: , Rfl:  ARTIFICIAL TEAR SOLUTION OP, Place 1 drop into both eyes daily as needed (dry eyes)., Disp: , Rfl:  aspirin 81 MG tablet, Take 81 mg by mouth daily., Disp: , Rfl:  benazepril (LOTENSIN) 40 MG tablet, Take 40 mg by mouth daily., Disp: , Rfl:  Calcium Carb-Cholecalciferol (CALCIUM 600 + D PO), Take 1 tablet by mouth daily., Disp: , Rfl:  Cholecalciferol 50 MCG (2000 UT) TABS, Take 2,000 Units by mouth daily., Disp: , Rfl:  cyanocobalamin (,VITAMIN B-12,) 1000 MCG/ML injection, Inject 1,000 mcg into the muscle every 30 (thirty) days., Disp: , Rfl:  fexofenadine (ALLEGRA ALLERGY) 180 MG tablet, Take 180 mg by mouth daily as needed for allergies or rhinitis., Disp: , Rfl:  fluticasone (FLONASE) 50 MCG/ACT nasal spray, Place into both nostrils daily as needed for allergies or rhinitis., Disp: , Rfl:  furosemide (LASIX) 20 MG tablet, Take 20 mg by mouth every other day., Disp: , Rfl:  hydroxychloroquine (PLAQUENIL) 200 MG tablet, Take 1 tablet by mouth twice daily, Disp: 60 tablet, Rfl: 0 metroNIDAZOLE (METROGEL) 0.75 % vaginal gel, Place 1 Applicatorful vaginally at bedtime., Disp: 70 g, Rfl: 5 rosuvastatin (CRESTOR) 10 MG tablet, Take 10 mg by mouth daily., Disp: , Rfl:  traZODone (DESYREL) 50 MG tablet, Take 50 mg by mouth at bedtime as needed for sleep., Disp: , Rfl:  triamcinolone cream (KENALOG) 0.1 %, Apply 1 application. topically 2 (two) times daily as needed., Disp: 454 g, Rfl: 3  No current facility-administered medications for this visit.    Blood pressure 128/85, pulse 68, height '5\' 3"'$  (1.6 m), weight 162 lb (73.5 kg).  Physical  Exam: Normal post TVH healing well Cuff non tender  Bimanual is negative  Diagnostic Tests:   Pathology:   Impression + Management plan: (R80.9) Proteinuria, unspecified type Plan: POCT Urinalysis Dipstick, Urine Culture   ICD-10-CM   1. S/P TVH, vaginal vault suspension 10/07/21  Z98.890     2. Proteinuria, unspecified type  R80.9 POCT Urinalysis Dipstick    Urine Culture         Medications Prescribed this encounter: Orders Placed This Encounter     metroNIDAZOLE (METROGEL) 0.75 % vaginal gel         Sig: Place 1 Applicatorful vaginally at bedtime.         Dispense:  70 g         Refill:  5     Follow up: prn   Florian Buff, MD Attending Physician for the [facility] 11/20/2021 11:31 AM

## 2021-11-24 LAB — URINE CULTURE

## 2021-11-30 NOTE — Progress Notes (Signed)
Office Visit Note  Patient: Tammy Jensen             Date of Birth: 05-Apr-1950           MRN: [id number]             PCP: Danna Hefty, DO Referring: Danna Hefty, DO Visit Date: 12/10/2021 Occupation: '@GUAROCC'$ @  Subjective:  Pain of the Right Shoulder (Worse at 9) and Follow-up   History of Present Illness: Tammy Jensen is a 71 y.o. female with history of seropositive rheumatoid arthritis, osteoarthritis and osteoporosis.  She states she underwent blepharoplasty in August 2023 and vaginal hysterectomy on October 07, 2021.  She is gradually recovering from her surgery.  She states for the last 1 months she has been having increased pain and discomfort in her right shoulder joint.  She states the pain is severe now on the scale of 0-10 about 9.  She has difficulty raising her arm.  She has had chronic discomfort in the left shoulder joint which is unchanged.  She continues to have pain and discomfort in her bilateral trochanteric bursa.  She has not taken any treatment for osteoporosis in a while.  She has not had a repeat bone density since 2021.  She has been taking calcium with vitamin D.  He has not been exercising due to right shoulder joint pain.  Activities of Daily Living:  Patient reports morning stiffness for 0 minutes.   Patient Reports nocturnal pain.  Difficulty dressing/grooming: Denies Difficulty climbing stairs: Denies Difficulty getting out of chair: Denies Difficulty using hands for taps, buttons, cutlery, and/or writing: Denies  Review of Systems  Constitutional:  Negative for fatigue.  HENT:  Negative for mouth sores and mouth dryness.   Eyes:  Negative for dryness.  Respiratory:  Negative for shortness of breath.   Cardiovascular:  Negative for chest pain and palpitations.  Gastrointestinal:  Positive for constipation and diarrhea. Negative for blood in stool.  Endocrine: Negative for increased urination.  Genitourinary:  Negative for  involuntary urination.  Musculoskeletal:  Positive for joint pain, joint pain and muscle weakness. Negative for gait problem, joint swelling, myalgias, morning stiffness, muscle tenderness and myalgias.  Skin:  Negative for color change, rash, hair loss and sensitivity to sunlight.  Allergic/Immunologic: Negative for susceptible to infections.  Neurological:  Negative for dizziness and headaches.  Hematological:  Negative for swollen glands.  Psychiatric/Behavioral:  Positive for sleep disturbance. Negative for depressed mood. The patient is not nervous/anxious.     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 involvement ([place]) 09/06/2019   High risk medication use 09/06/2019   Elevated LFTs 09/06/2019   Age-related osteoporosis without current pathological fracture 09/06/2019   Dyslipidemia 08/14/2019   Abnormal electrocardiogram 03/12/2019   Chest pain 03/12/2019   HTN (hypertension) 03/12/2019   Obesity 03/12/2019   Family history of colon cancer 05/28/2016    Past Medical History:  Diagnosis Date   Dysrhythmia    High cholesterol    Hypertension    IBS (irritable bowel syndrome)    Rheumatoid arthritis ([place])     Family History  Problem Relation Age of Onset   Colon cancer Father    Colon cancer Sister    Heart disease Mother    Dementia Mother    Arthritis Mother    Hemachromatosis Son    Hemachromatosis Son    Past  Surgical History:  Procedure Laterality Date   COLONOSCOPY N/A 10/07/2016   Procedure: COLONOSCOPY;  Surgeon: Rogene Houston, MD;  Location[facility];  Service: Endoscopy;  Laterality: N/A;  730   RECONSTRUCTION OF EYELID  09/02/2021   TUBAL LIGATION     VAGINAL HYSTERECTOMY N/A 10/07/2021   Procedure: HYSTERECTOMY VAGINAL;  Surgeon: Florian Buff, MD;  Location: [facility];  Service: Gynecology;  Laterality: N/A;   Social History    Social History Narrative   Not on file   Immunization History  Administered Date(s) Administered   PFIZER(Purple Top)SARS-COV-2 Vaccination 09/20/2019, 10/15/2019     Objective: Vital Signs: BP 137/89 (BP Location: Left Arm, Patient Position: Sitting)   Pulse 92   Resp 16   Ht '5\' 2"'$  (1.575 m)   Wt 162 lb (73.5 kg)   BMI 29.63 kg/m    Physical Exam Vitals and nursing note reviewed.  Constitutional:      Appearance: She is well-developed.  HENT:     Head: Normocephalic and atraumatic.  Eyes:     Conjunctiva/sclera: Conjunctivae normal.  Cardiovascular:     Rate and Rhythm: Normal rate and regular rhythm.     Heart sounds: Normal heart sounds.  Pulmonary:     Effort: Pulmonary effort is normal.     Breath sounds: Normal breath sounds.  Abdominal:     General: Bowel sounds are normal.     Palpations: Abdomen is soft.  Musculoskeletal:     Cervical back: Normal range of motion.  Lymphadenopathy:     Cervical: No cervical adenopathy.  Skin:    General: Skin is warm and dry.     Capillary Refill: Capillary refill takes less than 2 seconds.  Neurological:     Mental Status: She is alert and oriented to person, place, and time.  Psychiatric:        Behavior: Behavior normal.      Musculoskeletal Exam: Cervical spine was in good range of motion.  She had painful abduction and forward flexion and internal rotation of her right shoulder joint with abduction limited to 90 degrees.  She had discomfort range of motion of the left shoulder joint.  Elbow joints and wrist joints with good range of motion.  She had bilateral PIP and DIP thickening with no synovitis.  CDAI Exam: CDAI Score: -- Patient Global: 2 mm; Provider Global: 2 mm Swollen: --; Tender: -- Joint Exam 12/10/2021   No joint exam has been documented for this visit   There is currently no information documented on the homunculus. Go to the Rheumatology activity and complete the homunculus joint  exam.  Investigation: No additional findings.  Imaging: No results found.  Recent Labs: Lab Results  Component Value Date   WBC 5.6 10/05/2021   HGB 14.7 10/05/2021   PLT 219 10/05/2021   NA 139 10/05/2021   K 3.5 10/05/2021   CL 108 10/05/2021   CO2 24 10/05/2021   GLUCOSE 78 10/05/2021   BUN 18 10/05/2021   CREATININE 0.76 10/05/2021   BILITOT 1.4 (H) 10/05/2021   ALKPHOS 53 10/05/2021   AST 25 10/05/2021   ALT 19 10/05/2021   PROT 7.3 10/05/2021   ALBUMIN 4.0 10/05/2021   CALCIUM 9.0 10/05/2021   GFRAA 89 04/25/2020    Speciality Comments:  PLQ WNL 03/31/2020 [facility]  Procedures:  Large Joint Inj: R glenohumeral on 12/10/2021 10:38 AM Indications: pain Details: 27 G 1.5 in needle, posterior approach  Arthrogram: No  Medications:  1.5 mL lidocaine 1 %; 40 mg triamcinolone acetonide 40 MG/ML Aspirate: 0 mL Outcome: tolerated well, no immediate complications Procedure, treatment alternatives, risks and benefits explained, specific risks discussed. Consent was given by the patient. Immediately prior to procedure a time out was called to verify the correct patient, procedure, equipment, support staff and site/side marked as required. Patient was prepped and draped in the usual sterile fashion.    Large Joint Inj: L greater trochanter on 12/10/2021 10:38 AM Indications: pain Details: 27 G 1.5 in needle, lateral approach  Arthrogram: No  Medications: 40 mg triamcinolone acetonide 40 MG/ML; 1.5 mL lidocaine 1 % Aspirate: 0 mL Outcome: tolerated well, no immediate complications Procedure, treatment alternatives, risks and benefits explained, specific risks discussed. Consent was given by the patient. Immediately prior to procedure a time out was called to verify the correct patient, procedure, equipment, support staff and site/side marked as required. Patient was prepped and draped in the usual sterile fashion.     Allergies: Patient has no known  allergies.   Assessment / Plan:     Visit Diagnoses: Rheumatoid arthritis with rheumatoid factor of multiple sites without organ or systems involvement ([place]) - Positive rheumatoid factor, positive anti-CCP: Patient complains of pain and discomfort in her right shoulder joint for the last 1 month which has been progressively getting worse.  She is having difficulty sleeping on the right side and also getting dressed.  She has chronic discomfort in her left shoulder joint.  She has not shown any joint swelling.  She has been taking hydroxychloroquine on a regular basis.  High risk medication use - Plaquenil 200 mg 1 tablet by mouth twice daily started on August 14, 2019.  She continues to take hydroxychloroquine on a regular basis.  Labs obtained on October 05, 2021 CBC and CMP were normal.  PLQ EYE EXAMINATION WNL 03/31/2020.  Patient states she has eye examination scheduled this year.  Information regarding immunization was placed in the AVS.  Acute pain of right shoulder-she has been having pain and discomfort in the right shoulder for the last 1 month.  She is having difficulty raising her arm.  After informed consent was obtained and different treatment options were discussed per patient's request the right shoulder joint was injected with lidocaine and Kenalog as described above.  Patient tolerated the procedure well.  Postprocedure instructions were given.  Chronic left shoulder pain-she has chronic discomfort in the left shoulder joint.  She has handout on exercises.  She was encouraged to do exercises on a regular basis.  Trochanteric bursitis of both hips-she complains of bursitis in her bilateral hip joints.  She has nocturnal pain.  Left trochanteric bursitis more painful.  Per patient's request left trochanteric bursa was also injected with lidocaine and Kenalog as described above.  Side effects were discussed at length.  Pes cavus-proper fitting shoes were advised.  Age-related osteoporosis  without current pathological fracture - DEXA 03/19/2019 BMD as determined from [facility] L1-L2 is 0.763 g/cm2 with a T-Scoreof -3.3.  Fosamax May 2021-patient advised to discontinue fosamax by her PCP.  Patient has not had a bone density since 2021.  I will schedule a DEXA scan.  She may need more aggressive therapy based on her previous T-score.  Essential hypertension-blood pressure is normal today.  She is on amlodipine 5 mg p.o. daily.  I advised her to monitor blood pressure closely after cortisone injections.  Dyslipidemia-she is on Crestor.  She had some intentional weight loss.  Weight  loss diet and exercise was emphasized.  Association of heart disease with rheumatoid arthritis was also discussed.  History of IBS  Seasonal allergies  Orders: Orders Placed This Encounter  Procedures   Large Joint Inj   Large Joint Inj   No orders of the defined types were placed in this encounter.    Follow-Up Instructions: Return in about 3 months (around 03/11/2022) for Rheumatoid arthritis, Osteoporosis.   Bo Merino, MD  Note - This record has been created using Editor, commissioning.  Chart creation errors have been sought, but may not always  have been located. Such creation errors do not reflect on  the standard of medical care.

## 2021-12-02 ENCOUNTER — Other Ambulatory Visit: Payer: Self-pay | Admitting: Obstetrics & Gynecology

## 2021-12-02 ENCOUNTER — Telehealth: Payer: Self-pay | Admitting: *Deleted

## 2021-12-02 MED ORDER — SULFAMETHOXAZOLE-TRIMETHOPRIM 800-160 MG PO TABS: 1.0000 | ORAL_TABLET | Freq: Two times a day (BID) | ORAL | 0 refills | Status: DC

## 2021-12-02 NOTE — Telephone Encounter (Signed)
Left message @ 11:49 am. JSY

## 2021-12-02 NOTE — Telephone Encounter (Signed)
Pt aware that urine culture showed a UTI and med was sent to pharmacy. Pt voiced understanding. [place]

## 2021-12-02 NOTE — Telephone Encounter (Signed)
-----   Message from Florian Buff, MD sent at 12/02/2021 11:32 AM EST ----- Marcie Bal   I am not clear if this patient can see my note to her so call her and tell her she had a very low colony count bladder infection but since it is klebsiella I will treat it with bactrim, I am sending it in  Dr Elonda Husky ----- Message ----- From: Linton Rump, LPN Sent: [id number]/26/4158  11:10 AM EST To: Florian Buff, MD

## 2021-12-10 ENCOUNTER — Ambulatory Visit: Payer: 59 | Attending: Rheumatology | Admitting: Rheumatology

## 2021-12-10 ENCOUNTER — Encounter: Payer: Self-pay | Admitting: Rheumatology

## 2021-12-10 VITALS — BP 137/89 | HR 92 | Resp 16 | Ht 62.0 in | Wt 162.0 lb

## 2021-12-10 DIAGNOSIS — Z79899 Other long term (current) drug therapy: Secondary | ICD-10-CM | POA: Diagnosis not present

## 2021-12-10 DIAGNOSIS — M0579 Rheumatoid arthritis with rheumatoid factor of multiple sites without organ or systems involvement: Secondary | ICD-10-CM

## 2021-12-10 DIAGNOSIS — M25512 Pain in left shoulder: Secondary | ICD-10-CM

## 2021-12-10 DIAGNOSIS — M7061 Trochanteric bursitis, right hip: Secondary | ICD-10-CM

## 2021-12-10 DIAGNOSIS — G8929 Other chronic pain: Secondary | ICD-10-CM

## 2021-12-10 DIAGNOSIS — R7989 Other specified abnormal findings of blood chemistry: Secondary | ICD-10-CM

## 2021-12-10 DIAGNOSIS — M25511 Pain in right shoulder: Secondary | ICD-10-CM

## 2021-12-10 DIAGNOSIS — Z8719 Personal history of other diseases of the digestive system: Secondary | ICD-10-CM

## 2021-12-10 DIAGNOSIS — I10 Essential (primary) hypertension: Secondary | ICD-10-CM

## 2021-12-10 DIAGNOSIS — Q667 Congenital pes cavus, unspecified foot: Secondary | ICD-10-CM

## 2021-12-10 DIAGNOSIS — M810 Age-related osteoporosis without current pathological fracture: Secondary | ICD-10-CM

## 2021-12-10 DIAGNOSIS — M7062 Trochanteric bursitis, left hip: Secondary | ICD-10-CM | POA: Diagnosis not present

## 2021-12-10 DIAGNOSIS — E785 Hyperlipidemia, unspecified: Secondary | ICD-10-CM

## 2021-12-10 DIAGNOSIS — J302 Other seasonal allergic rhinitis: Secondary | ICD-10-CM

## 2021-12-10 MED ORDER — TRIAMCINOLONE ACETONIDE 40 MG/ML IJ SUSP
40.0000 mg | INTRAMUSCULAR | Status: AC | PRN
  Administered 2021-12-10: 40 mg via INTRA_ARTICULAR

## 2021-12-10 MED ORDER — LIDOCAINE HCL 1 % IJ SOLN
1.5000 mL | INTRAMUSCULAR | Status: AC | PRN
  Administered 2021-12-10: 1.5 mL

## 2021-12-10 NOTE — Patient Instructions (Addendum)
Standing Labs We placed an order today for your standing lab work.   Please have your standing labs drawn in March  Please have your labs drawn 2 weeks prior to your appointment so that the provider can discuss your lab results at your appointment.  Please note that you may see your imaging and lab results in [place] before we have reviewed them. We will contact you once all results are reviewed. Please allow our office up to 72 hours to thoroughly review all of the results before contacting the office for clarification of your results.  Lab hours are:   Monday through Thursday from 8:00 am -12:30 pm and 1:00 pm-5:00 pm and Friday from 8:00 am-12:00 pm.  Please be advised, all patients with office appointments requiring lab work will take precedent over walk-in lab work.   Labs are drawn by [facility]. Please bring your co-pay at the time of your lab draw.  You may receive a bill from [facility] for your lab work.  Please note if you are on Hydroxychloroquine and and an order has been placed for a Hydroxychloroquine level, you will need to have it drawn 4 hours or more after your last dose.  If you wish to have your labs drawn at another location, please call the office 24 hours in advance so we can fax the orders.  The office is located at [address] [place], [place], [place] [address] No appointment is necessary.    If you have any questions regarding directions or hours of operation,  please call [phone].   As a reminder, please drink plenty of water prior to coming for your lab work. Thanks!   Vaccines You are taking a medication(s) that can suppress your immune system.  The following immunizations are recommended: Flu annually Covid-19  Td/Tdap (tetanus, diphtheria, pertussis) every 10 years Pneumonia (Prevnar 15 then Pneumovax 23 at least 1 year apart.  Alternatively, can take Prevnar 20 without needing additional dose) Shingrix: 2 doses from 4 weeks to 6 months  apart  Please check with your PCP to make sure you are up to date.

## 2021-12-10 NOTE — Addendum Note (Signed)
Addended by: Earnestine Mealing on: 12/10/2021 11:38 AM   Modules accepted: Orders

## 2021-12-24 ENCOUNTER — Other Ambulatory Visit (HOSPITAL_COMMUNITY): Payer: Self-pay | Admitting: Family Medicine

## 2021-12-24 DIAGNOSIS — M810 Age-related osteoporosis without current pathological fracture: Secondary | ICD-10-CM

## 2021-12-24 DIAGNOSIS — Z1231 Encounter for screening mammogram for malignant neoplasm of breast: Secondary | ICD-10-CM

## 2022-01-08 ENCOUNTER — Other Ambulatory Visit (HOSPITAL_COMMUNITY): Payer: 59

## 2022-01-08 ENCOUNTER — Ambulatory Visit (HOSPITAL_COMMUNITY)
Admission: RE | Admit: 2022-01-08 | Discharge: 2022-01-08 | Disposition: A | Discharge status: Home / Self Care | Payer: 59 | Source: Ambulatory Visit | Attending: Family Medicine | Admitting: Family Medicine

## 2022-01-08 DIAGNOSIS — Z1231 Encounter for screening mammogram for malignant neoplasm of breast: Secondary | ICD-10-CM | POA: Insufficient documentation

## 2022-01-08 DIAGNOSIS — M810 Age-related osteoporosis without current pathological fracture: Secondary | ICD-10-CM | POA: Diagnosis not present

## 2022-01-25 ENCOUNTER — Other Ambulatory Visit: Payer: Self-pay | Admitting: Rheumatology

## 2022-01-25 NOTE — Telephone Encounter (Signed)
Patient called requesting prescription refill of Prednisone for her right shoulder pain be sent to [facility] in [place], [place].

## 2022-01-25 NOTE — Telephone Encounter (Signed)
Attempted to contact the patient and left message for patient to call the office.  

## 2022-01-26 MED ORDER — PREDNISONE 5 MG PO TABS: ORAL_TABLET | ORAL | 0 refills | Status: DC

## 2022-01-26 NOTE — Telephone Encounter (Signed)
Patient advised we are sending a prescription for a prednisone taper to the pharmacy for her.

## 2022-01-26 NOTE — Telephone Encounter (Signed)
Please send a prescription for prednisone taper starting at 20 mg and taper by 5 mg every 4 days.

## 2022-01-26 NOTE — Telephone Encounter (Signed)
Returned call to patient. She states at her last appointment she was given a shoulder injection which helped with the pain for a little while. Patient states her shoulder pain has come back for the last week. Patient states the pain is keeping her up at night. Patient states the cold and damp weather is not helping. Patient is on PLQ and denies missing any doses. Patient is requesting a prescription for Prednisone. Please advise.

## 2022-02-26 NOTE — Progress Notes (Deleted)
Office Visit Note  Patient: Tammy Jensen             Date of Birth: [birth year]           MRN: [id number]             PCP: Danna Hefty, DO Referring: Danna Hefty, DO Visit Date: 03/12/2022 Occupation: '@GUAROCC'$ @  Subjective:  No chief complaint on file.   History of Present Illness: Tammy Jensen is a 72 y.o. female ***     Activities of Daily Living:  Patient reports morning stiffness for *** {minute/hour:19697}.   Patient {ACTIONS;DENIES/REPORTS:21021675::"Denies"} nocturnal pain.  Difficulty dressing/grooming: {ACTIONS;DENIES/REPORTS:21021675::"Denies"} Difficulty climbing stairs: {ACTIONS;DENIES/REPORTS:21021675::"Denies"} Difficulty getting out of chair: {ACTIONS;DENIES/REPORTS:21021675::"Denies"} Difficulty using hands for taps, buttons, cutlery, and/or writing: {ACTIONS;DENIES/REPORTS:21021675::"Denies"}  No Rheumatology ROS completed.   PMFS History:  Patient Active Problem List   Diagnosis Date Noted  . Rheumatoid arthritis with rheumatoid factor of multiple sites without organ or systems involvement ([place]) 09/06/2019  . High risk medication use 09/06/2019  . Elevated LFTs 09/06/2019  . Age-related osteoporosis without current pathological fracture 09/06/2019  . Dyslipidemia 08/14/2019  . Abnormal electrocardiogram 03/12/2019  . Chest pain 03/12/2019  . HTN (hypertension) 03/12/2019  . Obesity 03/12/2019  . Family history of colon cancer 05/28/2016    Past Medical History:  Diagnosis Date  . Dysrhythmia   . High cholesterol   . Hypertension   . IBS (irritable bowel syndrome)   . Rheumatoid arthritis ([place])     Family History  Problem Relation Age of Onset  . Colon cancer Father   . Colon cancer Sister   . Heart disease Mother   . Dementia Mother   . Arthritis Mother   . Hemachromatosis Son   . Hemachromatosis Son    Past Surgical History:  Procedure Laterality Date  . COLONOSCOPY N/A 10/07/2016   Procedure: COLONOSCOPY;   Surgeon: Rogene Houston, MD;  Location[facility];  Service: Endoscopy;  Laterality: N/A;  730  . RECONSTRUCTION OF EYELID  09/02/2021  . TUBAL LIGATION    . VAGINAL HYSTERECTOMY N/A 10/07/2021   Procedure: HYSTERECTOMY VAGINAL;  Surgeon: Florian Buff, MD;  Location: [facility];  Service: Gynecology;  Laterality: N/A;   Social History   Social History Narrative  . Not on file   Immunization History  Administered Date(s) Administered  . PFIZER(Purple Top)SARS-COV-2 Vaccination 09/20/2019, 10/15/2019     Objective: Vital Signs: There were no vitals taken for this visit.   Physical Exam   Musculoskeletal Exam: ***  CDAI Exam: CDAI Score: -- Patient Global: --; Provider Global: -- Swollen: --; Tender: -- Joint Exam 03/12/2022   No joint exam has been documented for this visit   There is currently no information documented on the homunculus. Go to the Rheumatology activity and complete the homunculus joint exam.  Investigation: No additional findings.  Imaging: No results found.  Recent Labs: Lab Results  Component Value Date   WBC 5.6 10/05/2021   HGB 14.7 10/05/2021   PLT 219 10/05/2021   NA 139 10/05/2021   K 3.5 10/05/2021   CL 108 10/05/2021   CO2 24 10/05/2021   GLUCOSE 78 10/05/2021   BUN 18 10/05/2021   CREATININE 0.76 10/05/2021   BILITOT 1.4 (H) 10/05/2021   ALKPHOS 53 10/05/2021   AST 25 10/05/2021   ALT 19 10/05/2021   PROT 7.3 10/05/2021   ALBUMIN 4.0 10/05/2021   CALCIUM 9.0 10/05/2021   GFRAA 89 04/25/2020    Speciality  Comments: PLQ Eye Exam: 05/21/2021 WNL  [facility]  Procedures:  No procedures performed Allergies: Patient has no known allergies.   Assessment / Plan:     Visit Diagnoses: Rheumatoid arthritis with rheumatoid factor of multiple sites without organ or systems involvement ([facility])  High risk medication use  Acute pain of right shoulder  Chronic left shoulder pain  Trochanteric bursitis of both  hips  Pes cavus  Age-related osteoporosis without current pathological fracture  Essential hypertension  Dyslipidemia  History of IBS  Seasonal allergies  Elevated LFTs  Orders: No orders of the defined types were placed in this encounter.  No orders of the defined types were placed in this encounter.   Face-to-face time spent with patient was *** minutes. Greater than 50% of time was spent in counseling and coordination of care.  Follow-Up Instructions: No follow-ups on file.   Ofilia Neas, PA-C  Note - This record has been created using Dragon software.  Chart creation errors have been sought, but may not always  have been located. Such creation errors do not reflect on  the standard of medical care.

## 2022-02-28 ENCOUNTER — Encounter (HOSPITAL_COMMUNITY): Payer: Self-pay | Admitting: Emergency Medicine

## 2022-02-28 ENCOUNTER — Emergency Department (HOSPITAL_COMMUNITY)
Admission: EM | Admit: 2022-02-28 | Discharge: 2022-02-28 | Disposition: A | Discharge status: Home / Self Care | Payer: 59 | Attending: Emergency Medicine | Admitting: Emergency Medicine

## 2022-02-28 ENCOUNTER — Other Ambulatory Visit: Payer: Self-pay

## 2022-02-28 ENCOUNTER — Emergency Department (HOSPITAL_COMMUNITY): Payer: 59

## 2022-02-28 DIAGNOSIS — K5900 Constipation, unspecified: Secondary | ICD-10-CM | POA: Insufficient documentation

## 2022-02-28 DIAGNOSIS — K7689 Other specified diseases of liver: Secondary | ICD-10-CM | POA: Diagnosis not present

## 2022-02-28 DIAGNOSIS — Z7982 Long term (current) use of aspirin: Secondary | ICD-10-CM | POA: Insufficient documentation

## 2022-02-28 LAB — COMPREHENSIVE METABOLIC PANEL
ALT: 24 U/L (ref 0–44)
AST: 24 U/L (ref 15–41)
Albumin: 4.5 g/dL (ref 3.5–5.0)
Alkaline Phosphatase: 57 U/L (ref 38–126)
Anion gap: 11 (ref 5–15)
BUN: 23 mg/dL (ref 8–23)
CO2: 21 mmol/L — ABNORMAL LOW (ref 22–32)
Calcium: 9.8 mg/dL (ref 8.9–10.3)
Chloride: 105 mmol/L (ref 98–111)
Creatinine, Ser: 0.93 mg/dL (ref 0.44–1.00)
GFR, Estimated: >60 mL/min (ref >60)
Glucose, Bld: 127 mg/dL — ABNORMAL HIGH (ref 70–99)
Potassium: 4.4 mmol/L (ref 3.5–5.1)
Sodium: 137 mmol/L (ref 135–145)
Total Bilirubin: 1.7 mg/dL — ABNORMAL HIGH (ref 0.3–1.2)
Total Protein: 7.6 g/dL (ref 6.5–8.1)

## 2022-02-28 LAB — CBC WITH DIFFERENTIAL/PLATELET
Abs Immature Granulocytes: 0.05 10*3/uL (ref 0.00–0.07)
Basophils Absolute: 0.1 10*3/uL (ref 0.0–0.1)
Basophils Relative: 1 %
Eosinophils Absolute: 0 10*3/uL (ref 0.0–0.5)
Eosinophils Relative: 0 %
HCT: 47.8 % — ABNORMAL HIGH (ref 36.0–46.0)
Hemoglobin: 15.8 g/dL — ABNORMAL HIGH (ref 12.0–15.0)
Immature Granulocytes: 1 %
Lymphocytes Relative: 9 %
Lymphs Abs: 0.9 10*3/uL (ref 0.7–4.0)
MCH: 29.2 pg (ref 26.0–34.0)
MCHC: 33.1 g/dL (ref 30.0–36.0)
MCV: 88.4 fL (ref 80.0–100.0)
Monocytes Absolute: 0.3 10*3/uL (ref 0.1–1.0)
Monocytes Relative: 3 %
Neutro Abs: 8.9 10*3/uL — ABNORMAL HIGH (ref 1.7–7.7)
Neutrophils Relative %: 86 %
Platelets: 274 10*3/uL (ref 150–400)
RBC: 5.41 MIL/uL — ABNORMAL HIGH (ref 3.87–5.11)
RDW: 14.1 % (ref 11.5–15.5)
WBC: 10.2 10*3/uL (ref 4.0–10.5)
nRBC: 0 % (ref 0.0–0.2)

## 2022-02-28 LAB — LIPASE, BLOOD: Lipase: 36 U/L (ref 11–51)

## 2022-02-28 MED ORDER — LACTULOSE 10 GM/15ML PO SOLN
30.0000 g | Freq: Once | ORAL | Status: AC
  Administered 2022-02-28: 30 g via ORAL
  Filled 2022-02-28: qty 60

## 2022-02-28 MED ORDER — IOHEXOL 300 MG/ML  SOLN
100.0000 mL | Freq: Once | INTRAMUSCULAR | Status: AC | PRN
  Administered 2022-02-28: 100 mL via INTRAVENOUS

## 2022-02-28 NOTE — ED Provider Notes (Signed)
72 y.o. female w/ HTN, HLD, RA, elevated LFTs who p/w constipation.  Did miralax for a few days once per day, then tried dulcolax, then tried milk of magnesia. CT with moderate colonic stool, lesion on R hepatic lobe, no emergent findings. Will give her one dose of lactulose here and DC with improved bowel regimen. Patient will call gastroenterologist in the AM. DC w/ discharge instructions/return precautions. All questions answered to patient's satisfaction.    CT ABDOMEN PELVIS W CONTRAST  Final Result     Labs Reviewed  CBC WITH DIFFERENTIAL/PLATELET - Abnormal; Notable for the following components:      Result Value   RBC 5.41 (*)    Hemoglobin 15.8 (*)    HCT 47.8 (*)    Neutro Abs 8.9 (*)    All other components within normal limits  COMPREHENSIVE METABOLIC PANEL - Abnormal; Notable for the following components:   CO2 21 (*)    Glucose, Bld 127 (*)    Total Bilirubin 1.7 (*)    All other components within normal limits  LIPASE, BLOOD  URINALYSIS, ROUTINE W REFLEX MICROSCOPIC   Clinical Course as of 02/28/22 2146  Sun Feb 28, 2022  2146 CT ABDOMEN PELVIS W CONTRAST 1. No acute abnormality in the abdomen or pelvis. 2. Moderate colonic stool load. 3. Indeterminate lesion in the dome of the right hepatic lobe measuring 1.3 x 2.0 cm, similar in size compared with 02/11/2021. Recommend further evaluation with nonemergent liver protocol MRI. 4. Interval hysterectomy since 02/11/2021.  Aortic Atherosclerosis (ICD10-I70.0).   [HN]    Clinical Course User Index [HN] Audley Hose, MD      Audley Hose, MD 03/04/22 [phone]

## 2022-02-28 NOTE — ED Provider Triage Note (Signed)
Emergency Medicine Provider Triage Evaluation Note  Tammy Jensen , a 72 y.o. female  was evaluated in triage.  Pt complains of constipation x 10 days.  She has tried "everything" but she is still not having a bowel movement.  States she has been having 5-7 not today, no vomiting but is nauseated.  History of hysterectomy and hernia repair no other abdominal surgeries..  Review of Systems  Per HPI  Physical Exam  BP (!) 144/94 (BP Location: Right Arm)   Pulse (!) 113   Temp 98.3 F (36.8 C) (Oral)   Resp 20   Ht '5\' 2"'$  (1.575 m)   Wt 73.5 kg   SpO2 99%   BMI 29.63 kg/m  Gen:   Awake, no distress   Resp:  Normal effort  MSK:   Moves extremities without difficulty  Other:    Medical Decision Making  Medically screening exam initiated at 4:20 PM.  Appropriate orders placed.  Tammy Jensen was informed that the remainder of the evaluation will be completed by another provider, this initial triage assessment does not replace that evaluation, and the importance of remaining in the ED until their evaluation is complete.     Sherrill Raring, PA-C 02/28/22 1620

## 2022-02-28 NOTE — ED Notes (Signed)
Pt d/c home with visitor per MD order. Discharge summary reviewed, pt verbalizes understanding. Ambulatory off unit. No s/s of acute distress noted.

## 2022-02-28 NOTE — ED Notes (Signed)
Pt transported to CT ?

## 2022-02-28 NOTE — ED Triage Notes (Addendum)
Patient c/o constipation x10 days. Per patient now unable to drink or eat due to pressure in abd. Patient c/o pain to left flank. Per patient unsure of fever but reports chills. Denies any nausea, vomiting, or urinary symptoms. Patient does report some reflux. Patient has used Miralax, oral laxatives, and enemas with no relief.

## 2022-02-28 NOTE — Discharge Instructions (Signed)
You received a dose of lactulose in the emergency department.  As discussed continue taking MiraLAX up to 3 times a day, along with Dulcolax/Colace.  You can also continue to take milk of magnesia and enema.  These need to be done in conjunction as opposed to an isolation.  Please call your gastroenterologist in the morning as well.  For any concerning symptoms return to the emergency department.  Also have a nodule on your liver.  This can be monitored by your primary care doctor.

## 2022-02-28 NOTE — ED Provider Notes (Cosign Needed Addendum)
[facility] Provider Note   CSN: [id number] Arrival date & time: 02/28/22  1426     History  Chief Complaint  Patient presents with   Constipation    Tammy Jensen is a 72 y.o. female.  73 year old female presents today for evaluation of constipation.  States last bowel movement was 10 days ago.  Is still passing flatus.  States she has tried MiraLAX, Dulcolax, and milk of magnesia also reports that you not on the same days.  She attempted to call her gastroenterologist on Friday however they closed early at 24.  Denies any abdominal pain.  No nausea or vomiting.  No fever.  The history is provided by the patient. No language interpreter was used.       Home Medications Prior to Admission medications   Medication Sig Start Date End Date Taking? Authorizing Provider  amLODipine (NORVASC) 5 MG tablet Take 5 mg by mouth daily.  03/08/19   [provider]  ARTIFICIAL TEAR SOLUTION OP Place 1 drop into both eyes daily as needed (dry eyes).    [provider]  aspirin 81 MG tablet Take 81 mg by mouth daily. 03/10/06   [provider]  benazepril (LOTENSIN) 40 MG tablet Take 40 mg by mouth daily. 03/14/06   [provider]  Calcium Carb-Cholecalciferol (CALCIUM 600 + D PO) Take 1 tablet by mouth daily.    [provider]  Cholecalciferol 50 MCG (2000 UT) TABS Take 2,000 Units by mouth daily.    [provider]  cyanocobalamin (,VITAMIN B-12,) 1000 MCG/ML injection Inject 1,000 mcg into the muscle every 30 (thirty) days.    [provider]  fexofenadine (ALLEGRA ALLERGY) 180 MG tablet Take 180 mg by mouth daily as needed for allergies or rhinitis.    [provider]  fluticasone (FLONASE) 50 MCG/ACT nasal spray Place into both nostrils daily as needed for allergies or rhinitis.    [provider]  furosemide (LASIX) 20 MG tablet Take 20 mg by mouth every other day.     [provider]  hydroxychloroquine (PLAQUENIL) 200 MG tablet Take 1 tablet by mouth twice daily 06/24/21   Bo Merino, MD  metroNIDAZOLE (METROGEL) 0.75 % vaginal gel Place 1 Applicatorful vaginally at bedtime. Patient not taking: Reported on 12/10/2021 11/20/21   Florian Buff, MD  predniSONE (DELTASONE) 5 MG tablet Take 4 tabs po x 4 days, 3  tabs po x 4 days, 2  tabs po x 4 days, 1  tab po x 4 days 01/26/22   Bo Merino, MD  rosuvastatin (CRESTOR) 10 MG tablet Take 10 mg by mouth daily. 05/28/19   [provider]  sulfamethoxazole-trimethoprim (BACTRIM DS) 800-160 MG tablet Take 1 tablet by mouth 2 (two) times daily. Patient not taking: Reported on 12/10/2021 12/02/21   Florian Buff, MD  traZODone (DESYREL) 50 MG tablet Take 50 mg by mouth at bedtime as needed for sleep.    [provider]  triamcinolone cream (KENALOG) 0.1 % Apply 1 application. topically 2 (two) times daily as needed. 04/29/21   Lavonna Monarch, MD      Allergies    Patient has no known allergies.    Review of Systems   Review of Systems  Constitutional:  Negative for chills and fever.  Cardiovascular:  Negative for chest pain.  Gastrointestinal:  Positive for constipation. Negative for abdominal pain, nausea and vomiting.  Genitourinary:  Negative for dysuria.  Neurological:  Negative for light-headedness.  All other systems reviewed and are negative.   Physical Exam Updated Vital Signs BP (!) 154/81 (BP Location: Left Arm)   Pulse 77   Temp 98.2 F (36.8 C) (Oral)   Resp 18   Ht '5\' 2"'$  (1.575 m)   Wt 73.5 kg   SpO2 98%   BMI 29.63 kg/m  Physical Exam Vitals and nursing note reviewed.  Constitutional:      General: She is not in acute distress.    Appearance: Normal appearance. She is not ill-appearing.  HENT:     Head: Normocephalic and atraumatic.     Nose: Nose normal.  Eyes:     General: No scleral icterus.    Extraocular Movements: Extraocular movements  intact.     Conjunctiva/sclera: Conjunctivae normal.  Cardiovascular:     Rate and Rhythm: Normal rate and regular rhythm.     Pulses: Normal pulses.  Pulmonary:     Effort: Pulmonary effort is normal. No respiratory distress.     Breath sounds: Normal breath sounds. No wheezing or rales.  Abdominal:     General: There is no distension.     Tenderness: There is no abdominal tenderness. There is no right CVA tenderness, left CVA tenderness, guarding or rebound.  Musculoskeletal:        General: Normal range of motion.     Cervical back: Normal range of motion.  Skin:    General: Skin is warm and dry.  Neurological:     General: No focal deficit present.     Mental Status: She is alert. Mental status is at baseline.     ED Results / Procedures / Treatments   Labs (all labs ordered are listed, but only abnormal results are displayed) Labs Reviewed  CBC WITH DIFFERENTIAL/PLATELET - Abnormal; Notable for the following components:      Result Value   RBC 5.41 (*)    Hemoglobin 15.8 (*)    HCT 47.8 (*)    Neutro Abs 8.9 (*)    All other components within normal limits  COMPREHENSIVE METABOLIC PANEL - Abnormal; Notable for the following components:   CO2 21 (*)    Glucose, Bld 127 (*)    Total Bilirubin 1.7 (*)    All other components within normal limits  LIPASE, BLOOD  URINALYSIS, ROUTINE W REFLEX MICROSCOPIC    EKG None  Radiology CT ABDOMEN PELVIS W CONTRAST  Result Date: 02/28/2022 CLINICAL DATA:  Constipation for 10 days. Pressure in the abdomen. Pain in left flank. EXAM: CT ABDOMEN AND PELVIS WITH CONTRAST TECHNIQUE: Multidetector CT imaging of the abdomen and pelvis was performed using the standard protocol following bolus administration of intravenous contrast. RADIATION DOSE REDUCTION: This exam was performed according to the departmental dose-optimization program which includes automated exposure control, adjustment of the mA and/or kV according to patient size  and/or use of iterative reconstruction technique. CONTRAST:  178m OMNIPAQUE IOHEXOL 300 MG/ML  SOLN COMPARISON:  02/11/2021 FINDINGS: Lower chest: No acute abnormality. Hepatobiliary: Within the dome of the right hepatic lobe there is an indeterminate lesion measuring 1.3 x 2.0 cm (2/11) and is similar in size using similar technique compared with 02/11/2021. Small cyst adjacent to the gallbladder. Mild gallbladder distention. No biliary dilation. No radiopaque stones. Pancreas: Coarse calcification in the pancreatic body may be due to sequela of chronic pancreatitis. No evidence of acute pancreatitis. No pancreatic ductal dilation. Spleen: Unremarkable. Adrenals/Urinary Tract: Normal adrenal glands. No urinary calculi or hydronephrosis. Unremarkable bladder. Stomach/Bowel:  Normal caliber large and small bowel. Moderate colonic stool load. The appendix is not visualized. Small hiatal hernia. Unremarkable stomach. Vascular/Lymphatic: Aortic atherosclerosis. No enlarged abdominal or pelvic lymph nodes. Reproductive: Interval hysterectomy.  No adnexal mass. Other: No free intraperitoneal fluid or air. Musculoskeletal: No acute osseous abnormality. IMPRESSION: 1. No acute abnormality in the abdomen or pelvis. 2. Moderate colonic stool load. 3. Indeterminate lesion in the dome of the right hepatic lobe measuring 1.3 x 2.0 cm, similar in size compared with 02/11/2021. Recommend further evaluation with nonemergent liver protocol MRI. 4. Interval hysterectomy since 02/11/2021. Aortic Atherosclerosis (ICD10-I70.0). Electronically Signed   By: Placido Sou M.D.   On: 02/28/2022 20:58    Procedures Procedures    Medications Ordered in ED Medications  iohexol (OMNIPAQUE) 300 MG/ML solution 100 mL (100 mLs Intravenous Contrast Given 02/28/22 2031)    ED Course/ Medical Decision Making/ A&P Clinical Course as of 02/28/22 2159  Sun Feb 28, 2022  2146 CT ABDOMEN PELVIS W CONTRAST 1. No acute abnormality in the  abdomen or pelvis. 2. Moderate colonic stool load. 3. Indeterminate lesion in the dome of the right hepatic lobe measuring 1.3 x 2.0 cm, similar in size compared with 02/11/2021. Recommend further evaluation with nonemergent liver protocol MRI. 4. Interval hysterectomy since 02/11/2021.  Aortic Atherosclerosis (ICD10-I70.0).   [HN]    Clinical Course User Index [HN] Audley Hose, MD                             Medical Decision Making Amount and/or Complexity of Data Reviewed Radiology:  Decision-making details documented in ED Course.  Risk Prescription drug management.   72 year old female presents today for evaluation of constipation.  Last bowel movement 10 days ago.  Appears to be without acute distress.  Abdomen soft nondistended, and nontender.  CBC without leukocytosis.  CMP without acute findings.  Lipase within normal limits.  CT abdomen pelvis with contrast shows moderate stool burden, no stool ball.  No bowel obstruction or other acute process.  I offered patient rectal exam to evaluate for impacted stool however she defers this.  Aggressive bowel regimen discussed.  Patient states she will call her gastroenterologist in the morning.  Strict return precautions discussed.  Patient voices understanding and is in agreement with plan.  Discussed with attending who is in agreement.  Lactulose 30 mg given in the emergency department prior to discharge.  Aggressive bowel regimen discussed with MiraLAX up to 3 times a day, Dulcolax.  No recent medication changes or obvious medications on her med list that would lead to constipation.  Incidental liver nodule noted on CT.  Patient made aware.  She will follow-up with PCP.   Final Clinical Impression(s) / ED Diagnoses Final diagnoses:  Constipation, unspecified constipation type  Liver nodule    Rx / DC Orders ED Discharge Orders     None         Evlyn Courier, PA-C 02/28/22 2207    Evlyn Courier, PA-C 02/28/22 2207     Audley Hose, MD 03/01/22 [phone]

## 2022-03-04 ENCOUNTER — Encounter: Payer: Self-pay | Admitting: Radiology

## 2022-03-05 ENCOUNTER — Encounter (INDEPENDENT_AMBULATORY_CARE_PROVIDER_SITE_OTHER): Payer: Self-pay | Admitting: *Deleted

## 2022-03-11 ENCOUNTER — Encounter (INDEPENDENT_AMBULATORY_CARE_PROVIDER_SITE_OTHER): Payer: Self-pay | Admitting: Gastroenterology

## 2022-03-11 ENCOUNTER — Telehealth (INDEPENDENT_AMBULATORY_CARE_PROVIDER_SITE_OTHER): Payer: Self-pay | Admitting: Gastroenterology

## 2022-03-11 ENCOUNTER — Ambulatory Visit (INDEPENDENT_AMBULATORY_CARE_PROVIDER_SITE_OTHER): Payer: 59 | Admitting: Gastroenterology

## 2022-03-11 VITALS — BP 127/78 | HR 82 | Temp 98.2°F | Ht 62.0 in | Wt 162.0 lb

## 2022-03-11 DIAGNOSIS — Z800 Family history of malignant neoplasm of digestive organs: Secondary | ICD-10-CM

## 2022-03-11 DIAGNOSIS — R194 Change in bowel habit: Secondary | ICD-10-CM

## 2022-03-11 DIAGNOSIS — K5900 Constipation, unspecified: Secondary | ICD-10-CM | POA: Insufficient documentation

## 2022-03-11 DIAGNOSIS — D1803 Hemangioma of intra-abdominal structures: Secondary | ICD-10-CM | POA: Diagnosis not present

## 2022-03-11 MED ORDER — PEG 3350-KCL-NA BICARB-NACL 420 G PO SOLR: 4000.0000 mL | Freq: Once | ORAL | 0 refills | Status: AC

## 2022-03-11 NOTE — Telephone Encounter (Signed)
Pt scheduled for TCS. No PA needed.    Notification or Prior Authorization is not required for the requested services You are not required to submit a notification/prior authorization based on the information provided. If you have general questions about the prior authorization requirements, visit [email or web address] > Clinician Resources > Advance and Admission Notification Requirements. The number above acknowledges your notification. Please write this reference number down for future reference. If you would like to request an organization determination, please call us at [phone]. Decision ID #: [id number]

## 2022-03-11 NOTE — Patient Instructions (Addendum)
We will get you scheduled for colonoscopy Please have thyroid testing done to rule this out as a cause for constipation. Please note that semiglutide you are on for weight loss can also cause constipation Increase water intake, aim for atleast 64 oz per day Increase fruits, veggies and whole grains, kiwi and prunes are especially good for constipation Take 1 capful of miralax 3x/day. You can drop down to 2 capfuls if bowels are moving well I will discuss repeat MRI of liver lesion with Dr. Jenetta Downer  Follow up 3 months  It was a pleasure to see you today. I want to create trusting relationships with patients and provide genuine, compassionate, and quality care. I truly value your feedback! please be on the lookout for a survey regarding your visit with me today. I appreciate your input about our visit and your time in completing this!    Tammy Jensen L. Alver Sorrow, MSN, APRN, AGNP-C Adult-Gerontology Nurse Practitioner [facility]

## 2022-03-11 NOTE — Progress Notes (Addendum)
Referring Provider: Danna Hefty, DO Primary Care Physician:  Danna Hefty, DO Primary GI Physician:  new   Chief Complaint  Patient presents with   Constipation    Went to ED for constipation after not having Bm for 10 days. Tried miralax, milk of mag, geritol, and enema. Would like to schedule colonoscopy.    HPI:   Tammy Jensen is a 72 y.o. female with past medical history of high cholesterol, dysrhythmia, HTN, IBS, RA  Patient presenting today as a new patient for constipation.   Recent ED visit at the end of February for constipation. CT A/P with contrast at that time showed moderate colonic stool burden as well as Indeterminate lesion in the dome of the right hepatic lobe measuring 1.3 x 2.0 cm, similar in size compared with 02/11/2021. 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 She had taken miralax, dulcolax, MOM, geritol without any results. She was given lactulose in ED and told to take 1-2 miralax capfuls per day and colace. She notes that she still has not had a great BM after this. She is feeling somewhat bloated. She has not really had a BM in 3-4 days and last was very small when she did go. Denies rectal bleeding. She has some discomfort in her abdomen and notes appetite is low as she feels full. She has been dealing with more severe constipation for the past month, previously going to the restroom every 1-2 days. She denies changes in her diet prior to constipation occurring. No melena. She did have some nausea with vomiting prior to going to the ED but this has resolved. Water intake is decent,  drinking 2-3 16 oz bottles but usually drinking more than this.    She is notably on semaglutide for weight loss, had 3 shots so far.   NSAID use:  none  Social hx: no etoh or tobacco  Fam hx: father (unsure of age at diagnosis) and sister (around 36) had CRC   Last Colonoscopy:10/2016  - Perianal skin tags found on perianal exam.                           - The entire examined colon is normal.                           - External hemorrhoids.                           - No specimens collected.  Last Endoscopy:2013  Recommendations:    Past Medical History:  Diagnosis Date   Dysrhythmia    High cholesterol    Hypertension    IBS (irritable bowel syndrome)    Rheumatoid arthritis ([place])     Past Surgical History:  Procedure Laterality Date   COLONOSCOPY N/A 10/07/2016   Procedure: COLONOSCOPY;  Surgeon: Rogene Houston, MD;  Location: [facility];  Service: Endoscopy;  Laterality: N/A;  730   RECONSTRUCTION OF EYELID  09/02/2021   TUBAL LIGATION  VAGINAL HYSTERECTOMY N/A 10/07/2021   Procedure: HYSTERECTOMY VAGINAL;  Surgeon: Florian Buff, MD;  Location: [facility];  Service: Gynecology;  Laterality: N/A;    Current Outpatient Medications  Medication Sig Dispense Refill   amLODipine (NORVASC) 5 MG tablet Take 5 mg by mouth daily.      ARTIFICIAL TEAR SOLUTION OP Place 1 drop into both eyes daily as needed (dry eyes).     aspirin 81 MG tablet Take 81 mg by mouth daily.     benazepril (LOTENSIN) 40 MG tablet Take 40 mg by mouth daily.     Calcium Carb-Cholecalciferol (CALCIUM 600 + D PO) Take 1 tablet by mouth daily.     Cholecalciferol 50 MCG (2000 UT) TABS Take 2,000 Units by mouth daily.     cyanocobalamin (,VITAMIN B-12,) 1000 MCG/ML injection Inject 1,000 mcg into the muscle every 30 (thirty) days.     fexofenadine (ALLEGRA ALLERGY) 180 MG tablet Take 180 mg by mouth daily as needed for allergies or rhinitis.     fluticasone (FLONASE) 50 MCG/ACT nasal spray Place  into both nostrils daily as needed for allergies or rhinitis.     furosemide (LASIX) 20 MG tablet Take 20 mg by mouth every other day.     hydroxychloroquine (PLAQUENIL) 200 MG tablet Take 1 tablet by mouth twice daily 60 tablet 0   naproxen (NAPROSYN) 375 MG tablet Take 375 mg by mouth as needed.     rosuvastatin (CRESTOR) 10 MG tablet Take 10 mg by mouth daily.     traZODone (DESYREL) 50 MG tablet Take 50 mg by mouth at bedtime as needed for sleep.     triamcinolone cream (KENALOG) 0.1 % Apply 1 application. topically 2 (two) times daily as needed. 454 g 3   No current facility-administered medications for this visit.    Allergies as of 03/11/2022   (No Known Allergies)    Family History  Problem Relation Age of Onset   Colon cancer Father    Colon cancer Sister    Heart disease Mother    Dementia Mother    Arthritis Mother    Hemachromatosis Son    Hemachromatosis Son     Social History   Socioeconomic History   Marital status: Single    Spouse name: Not on file   Number of children: Not on file   Years of education: Not on file   Highest education level: Not on file  Occupational History   Not on file  Tobacco Use   Smoking status: Never    Passive exposure: Never   Smokeless tobacco: Never  Vaping Use   Vaping Use: Never used  Substance and Sexual Activity   Alcohol use: Not Currently    Comment: occasional   Drug use: No   Sexual activity: Not Currently    Birth control/protection: Post-menopausal, Surgical    Comment: hyst  Other Topics Concern   Not on file  Social History Narrative   Not on file   Social Determinants of Health   Financial Resource Strain: Low Risk  (04/03/2021)   Overall Financial Resource Strain (CARDIA)    Difficulty of Paying Living Expenses: Not very hard  Food Insecurity: Food Insecurity Present (04/03/2021)   Hunger Vital Sign    Worried About Running Out of Food in the Last Year: Sometimes true    Ran Out of Food in the Last  Year: Sometimes true  Transportation Needs: No Transportation Needs (04/03/2021)   PRAPARE - Transportation  Lack of Transportation (Medical): No    Lack of Transportation (Non-Medical): No  Physical Activity: Insufficiently Active (04/03/2021)   Exercise Vital Sign    Days of Exercise per Week: 2 days    Minutes of Exercise per Session: 20 min  Stress: No Stress Concern Present (04/03/2021)   [place]    Feeling of Stress : Only a little  Social Connections: Moderately Integrated (04/03/2021)   Social Connection and Isolation Panel [NHANES]    Frequency of Communication with Friends and Family: More than three times a week    Frequency of Social Gatherings with Friends and Family: Once a week    Attends Religious Services: More than 4 times per year    Active Member of [employer] or Organizations: Yes    Attends Archivist Meetings: 1 to 4 times per year    Marital Status: Divorced    Review of systems General: negative for malaise, night sweats, fever, chills, weight loss Neck: Negative for lumps, goiter, pain and significant neck swelling Resp: Negative for cough, wheezing, dyspnea at rest CV: Negative for chest pain, leg swelling, palpitations, orthopnea GI: denies melena, hematochezia, nausea, vomiting, diarrhea, dysphagia, odyonophagia, early satiety or unintentional weight loss. +constipation +bloating  MSK: Negative for joint pain or swelling, back pain, and muscle pain. Derm: Negative for itching or rash Psych: Denies depression, anxiety, memory loss, confusion. No homicidal or suicidal ideation.  Heme: Negative for prolonged bleeding, bruising easily, and swollen nodes. Endocrine: Negative for cold or heat intolerance, polyuria, polydipsia and goiter. Neuro: negative for tremor, gait imbalance, syncope and seizures. The remainder of the review of systems is noncontributory.  Physical Exam: BP 127/78  (BP Location: Left Arm, Patient Position: Sitting, Cuff Size: Large)   Pulse 82   Temp 98.2 F (36.8 C) (Oral)   Ht '5\' 2"'$  (1.575 m)   Wt 162 lb (73.5 kg)   BMI 29.63 kg/m  General:   Alert and oriented. No distress noted. Pleasant and cooperative.  Head:  Normocephalic and atraumatic. Eyes:  Conjuctiva clear without scleral icterus. Mouth:  Oral mucosa pink and moist. Good dentition. No lesions. Heart: Normal rate and rhythm, s1 and s2 heart sounds present.  Lungs: Clear lung sounds in all lobes. Respirations equal and unlabored. Abdomen:  +BS, soft, non-tender and non-distended. No rebound or guarding. No HSM or masses noted. Derm: No palmar erythema or jaundice Msk:  Symmetrical without gross deformities. Normal posture. Extremities:  Without edema. Neurologic:  Alert and  oriented x4 Psych:  Alert and cooperative. Normal mood and affect.  Invalid input(s): "6 MONTHS"   ASSESSMENT: Tammy Jensen is a 72 y.o. female presenting today as a new patient for constipation.  History of IBS though typically having 1-2 bowel movements per day at baseline, however she reports about 1 month ago she had having more constipation at one point going to 10 days without a bowel movement which prompted her to go to the ED, CT scan done there showed moderate colonic stool burden.  She is still not moving her bowels very well.  Notably she is on Ozempic for weight loss and has been on this for about 1 month.  Ozempic can cause constipation, query if this is contributing to her symptoms. CT hematuria workup in feb 2023 with notable suspected pelvic floor laxity, this could also be contributing to her constipation.  Will check TSH to rule out underlying thyroid disease as a contributing factor.  Encouraged  to continue with good water intake, aim for 64 ounces per day and diet high in fruits and veggies especially kiwi and prunes.  Advised to take MiraLAX 1 capful 3 times per day.  She can back down to 2  capfuls per day if bowels begin to move well.  She does have history of colon cancer in 2 first-degree relatives, last colonoscopy was in 2018, recommend proceeding with colonoscopy at this time. Indications, risks and benefits of procedure discussed in detail with patient. Patient verbalized understanding and is in agreement to proceed with Colonoscopy.   There was presence of Indeterminate lesion in the dome of the right hepatic lobe measuring 1.3 x 2.0 cm, on CT done in ED in February. 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 given this appears to be the same lesion and actually decreased in size will discuss with Dr. Jenetta Downer if there is indication to repeat MRI of liver at this time as we generally do not perform surveillance imaging of hemangiomas.    PLAN:  Schedule Colonoscopy ASA II (2 day prep, semaglutide needs to be held x7 days prior to TCS) 2. Increase fruits, veggies, especially kiwi and prunes 3. TSH 4. Miralax 3 capfuls day 5. Good water intake, aim for 64oz per day  6. Discuss repeat MRI liver with DR. Castaneda.   All questions were answered, patient verbalized understanding and is in agreement with plan as outlined above.   Follow Up: 3 months   Glanda Spanbauer L. [place], MSN, APRN, AGNP-C Adult-Gerontology Nurse Practitioner [facility]  I have reviewed the note and agree with the APP's assessment as described in this progress note  Given that the lesion in liver is located in same area and has actually decreased in size, this is consistent with her previously described hemangioma. No further imaging surveillance is warranted for this.  Maylon Peppers, MD Gastroenterology and Hepatology [facility]

## 2022-03-12 ENCOUNTER — Telehealth: Payer: Self-pay | Admitting: Rheumatology

## 2022-03-12 ENCOUNTER — Ambulatory Visit: Payer: 59 | Admitting: Physician Assistant

## 2022-03-12 ENCOUNTER — Other Ambulatory Visit: Payer: Self-pay

## 2022-03-12 DIAGNOSIS — I10 Essential (primary) hypertension: Secondary | ICD-10-CM

## 2022-03-12 DIAGNOSIS — G8929 Other chronic pain: Secondary | ICD-10-CM

## 2022-03-12 DIAGNOSIS — M25511 Pain in right shoulder: Secondary | ICD-10-CM

## 2022-03-12 DIAGNOSIS — M0579 Rheumatoid arthritis with rheumatoid factor of multiple sites without organ or systems involvement: Secondary | ICD-10-CM

## 2022-03-12 DIAGNOSIS — J302 Other seasonal allergic rhinitis: Secondary | ICD-10-CM

## 2022-03-12 DIAGNOSIS — E785 Hyperlipidemia, unspecified: Secondary | ICD-10-CM

## 2022-03-12 DIAGNOSIS — Z79899 Other long term (current) drug therapy: Secondary | ICD-10-CM

## 2022-03-12 DIAGNOSIS — Z8719 Personal history of other diseases of the digestive system: Secondary | ICD-10-CM

## 2022-03-12 DIAGNOSIS — Q667 Congenital pes cavus, unspecified foot: Secondary | ICD-10-CM

## 2022-03-12 DIAGNOSIS — M810 Age-related osteoporosis without current pathological fracture: Secondary | ICD-10-CM

## 2022-03-12 DIAGNOSIS — M7061 Trochanteric bursitis, right hip: Secondary | ICD-10-CM

## 2022-03-12 DIAGNOSIS — R7989 Other specified abnormal findings of blood chemistry: Secondary | ICD-10-CM

## 2022-03-12 LAB — TSH: TSH: 2.11 mIU/L (ref 0.40–4.50)

## 2022-03-12 NOTE — Telephone Encounter (Signed)
Called patient to reschedule appointment today 03/12/2022 due to provider being out of office. Patient states she was supposed to have a hip injection today. Patient states she has has a hip injection done before by Lovena Le. Ok to schedule with Lovena Le?

## 2022-03-12 NOTE — Telephone Encounter (Signed)
Patient advised if she wants we can send a prednisone taper starting at 20 mg and taper by 5 mg every 2 days. Explained to the patient that prednisone can cause weight gain, hypertension, diabetes, heart disease, cataracts and osteoporosis. Will be safer for her to wait until 14th and get cortisone injection which has less side effects. Patient requested to have the prednisone taper sent to the pharmacy. Pended prescription and sent to Dr. Estanislado Pandy to send to the pharmacy.

## 2022-03-12 NOTE — Telephone Encounter (Signed)
If patient wants please send prednisone taper starting at 20 mg and taper by 5 mg every 2 days.  Please explain to the patient that prednisone can cause weight gain, hypertension, diabetes, heart disease, cataracts and osteoporosis.  Will be safer for her to wait until 14th and get cortisone injection which has less side effects.

## 2022-03-12 NOTE — Telephone Encounter (Signed)
Patient contacted the office and states she was supposed to get injections at her appointment today 03/12/2022. Patient states her appointment was moved to 03/18/2022. Patient inquired if Prednisone can be sent to [place] on [address] to help with pain until her [place] appointment. Patient's call back number is [phone]. Please advise.

## 2022-03-14 MED ORDER — PREDNISONE 5 MG PO TABS: ORAL_TABLET | ORAL | 0 refills | Status: DC

## 2022-03-17 NOTE — Progress Notes (Unsigned)
Office Visit Note  Patient: Tammy Jensen             Date of Birth: 09-07-1950           MRN: [id number]             PCP: Danna Hefty, DO Referring: Danna Hefty, DO Visit Date: 03/18/2022 Occupation: '@GUAROCC'$ @  Subjective:  Trochanteric bursitis of both hips  History of Present Illness: Tammy Jensen is a 72 y.o. female with history of seronegative rheumatoid arthritis.  She is taking plaquenil 200 mg 1 tablet by mouth twice daily.  She is tolerating Plaquenil without any side effects and has not missed any doses recently.  She denies any recent rheumatoid arthritis flares.  Patient presents today with increased discomfort in both hip joints consistent with trochanter bursitis.  She is not currently experiencing any groin pain.  She has been having pain when lying on her sides at night as well as has difficulty walking for long distances due to the discomfort.  She requested bilateral trochanteric bursa cortisone injections today. Patient reports that she had a recent updated bone density in January 2024.  She has continued to take a calcium and vitamin D supplement daily.  Patient is no longer taking Fosamax.  Patient reports that she took Fosamax for a total of 3 years.  She denies any recent falls or fractures.   Activities of Daily Living:  Patient reports morning stiffness for 0 minute.   Patient Reports nocturnal pain.  Difficulty dressing/grooming: Denies Difficulty climbing stairs: Denies Difficulty getting out of chair: Denies Difficulty using hands for taps, buttons, cutlery, and/or writing: Denies  Review of Systems  Constitutional:  Negative for fatigue.  HENT:  Negative for mouth sores and mouth dryness.   Eyes:  Negative for dryness.  Respiratory:  Negative for shortness of breath.   Cardiovascular:  Negative for chest pain and palpitations.  Gastrointestinal:  Positive for constipation and diarrhea. Negative for blood in stool.  Endocrine:  Negative for increased urination.  Genitourinary:  Negative for involuntary urination.  Musculoskeletal:  Positive for joint pain and joint pain. Negative for gait problem, joint swelling, myalgias, muscle weakness, morning stiffness, muscle tenderness and myalgias.  Skin:  Positive for sensitivity to sunlight. Negative for color change, rash and hair loss.  Allergic/Immunologic: Negative for susceptible to infections.  Neurological:  Negative for dizziness and headaches.  Hematological:  Negative for swollen glands.  Psychiatric/Behavioral:  Positive for sleep disturbance. Negative for depressed mood. The patient is not nervous/anxious.     PMFS History:  Patient Active Problem List   Diagnosis Date Noted   Constipation 03/11/2022   Liver hemangioma 03/11/2022   Rheumatoid arthritis with rheumatoid factor of multiple sites without organ or systems involvement ([place]) 09/06/2019   High risk medication use 09/06/2019   Elevated LFTs 09/06/2019   Age-related osteoporosis without current pathological fracture 09/06/2019   Dyslipidemia 08/14/2019   Abnormal electrocardiogram 03/12/2019   Chest pain 03/12/2019   HTN (hypertension) 03/12/2019   Obesity 03/12/2019   Family history of colon cancer 05/28/2016    Past Medical History:  Diagnosis Date   Dysrhythmia    High cholesterol    Hypertension    IBS (irritable bowel syndrome)    Rheumatoid arthritis ([place])     Family History  Problem Relation Age of Onset   Colon cancer Father    Colon cancer Sister    Heart disease Mother    Dementia Mother    Arthritis  Mother    Hemachromatosis Son    Hemachromatosis Son    Past Surgical History:  Procedure Laterality Date   COLONOSCOPY N/A 10/07/2016   Procedure: COLONOSCOPY;  Surgeon: Rogene Houston, MD;  Location: [facility];  Service: Endoscopy;  Laterality: N/A;  730   RECONSTRUCTION OF EYELID  09/02/2021   TUBAL LIGATION     VAGINAL HYSTERECTOMY N/A 10/07/2021   Procedure:  HYSTERECTOMY VAGINAL;  Surgeon: Florian Buff, MD;  Location: [facility];  Service: Gynecology;  Laterality: N/A;   Social History   Social History Narrative   Not on file   Immunization History  Administered Date(s) Administered   PFIZER(Purple Top)SARS-COV-2 Vaccination 09/20/2019, 10/15/2019     Objective: Vital Signs: BP 113/73 (BP Location: Left Arm, Patient Position: Sitting, Cuff Size: Normal)   Pulse (!) 58   Resp 12   Ht '5\' 2"'$  (1.575 m)   Wt 162 lb (73.5 kg)   BMI 29.63 kg/m    Physical Exam Vitals and nursing note reviewed.  Constitutional:      Appearance: She is well-developed.  HENT:     Head: Normocephalic and atraumatic.  Eyes:     Conjunctiva/sclera: Conjunctivae normal.  Cardiovascular:     Rate and Rhythm: Normal rate and regular rhythm.     Heart sounds: Normal heart sounds.  Pulmonary:     Effort: Pulmonary effort is normal.     Breath sounds: Normal breath sounds.  Abdominal:     General: Bowel sounds are normal.     Palpations: Abdomen is soft.  Musculoskeletal:     Cervical back: Normal range of motion.  Skin:    General: Skin is warm and dry.     Capillary Refill: Capillary refill takes less than 2 seconds.  Neurological:     Mental Status: She is alert and oriented to person, place, and time.  Psychiatric:        Behavior: Behavior normal.      Musculoskeletal Exam: C-spine, thoracic spine, lumbar spine have good range of motion.  No midline spinal tenderness.  Shoulder joints, elbow joints, wrist joints, MCPs, PIPs, DIPs have good range of motion with no synovitis.  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  Complete fist formation bilaterally.  Hip joints have good range of motion with no groin pain.  Tenderness over bilateral trochanteric bursa.  Knee joints have good range of motion with no warmth or effusion.  Ankle joints have good range of motion with no tenderness or synovitis.  CDAI Exam: CDAI Score:  -- Patient Global: 4 mm; Provider Global: 4 mm Swollen: --; Tender: -- Joint Exam 03/18/2022   No joint exam has been documented for this visit   There is currently no information documented on the homunculus. Go to the Rheumatology activity and complete the homunculus joint exam.  Investigation: No additional findings.  Imaging: CT ABDOMEN PELVIS W CONTRAST  Result Date: 02/28/2022 CLINICAL DATA:  Constipation for 10 days. Pressure in the abdomen. Pain in left flank. EXAM: CT ABDOMEN AND PELVIS WITH CONTRAST TECHNIQUE: Multidetector CT imaging of the abdomen and pelvis was performed using the standard protocol following bolus administration of intravenous contrast. RADIATION DOSE REDUCTION: This exam was performed according to the departmental dose-optimization program which includes automated exposure control, adjustment of the mA and/or kV according to patient size and/or use of iterative reconstruction technique. CONTRAST:  152m OMNIPAQUE IOHEXOL 300 MG/ML  SOLN COMPARISON:  02/11/2021 FINDINGS: Lower chest: No acute  abnormality. Hepatobiliary: Within the dome of the right hepatic lobe there is an indeterminate lesion measuring 1.3 x 2.0 cm (2/11) and is similar in size using similar technique compared with 02/11/2021. Small cyst adjacent to the gallbladder. Mild gallbladder distention. No biliary dilation. No radiopaque stones. Pancreas: Coarse calcification in the pancreatic body may be due to sequela of chronic pancreatitis. No evidence of acute pancreatitis. No pancreatic ductal dilation. Spleen: Unremarkable. Adrenals/Urinary Tract: Normal adrenal glands. No urinary calculi or hydronephrosis. Unremarkable bladder. Stomach/Bowel: Normal caliber large and small bowel. Moderate colonic stool load. The appendix is not visualized. Small hiatal hernia. Unremarkable stomach. Vascular/Lymphatic: Aortic atherosclerosis. No enlarged abdominal or pelvic lymph nodes. Reproductive: Interval hysterectomy.   No adnexal mass. Other: No free intraperitoneal fluid or air. Musculoskeletal: No acute osseous abnormality. IMPRESSION: 1. No acute abnormality in the abdomen or pelvis. 2. Moderate colonic stool load. 3. Indeterminate lesion in the dome of the right hepatic lobe measuring 1.3 x 2.0 cm, similar in size compared with 02/11/2021. Recommend further evaluation with nonemergent liver protocol MRI. 4. Interval hysterectomy since 02/11/2021. Aortic Atherosclerosis (ICD10-I70.0). Electronically Signed   By: Placido Sou M.D.   On: 02/28/2022 20:58    Recent Labs: Lab Results  Component Value Date   WBC 10.2 02/28/2022   HGB 15.8 (H) 02/28/2022   PLT 274 02/28/2022   NA 137 02/28/2022   K 4.4 02/28/2022   CL 105 02/28/2022   CO2 21 (L) 02/28/2022   GLUCOSE 127 (H) 02/28/2022   BUN 23 02/28/2022   CREATININE 0.93 02/28/2022   BILITOT 1.7 (H) 02/28/2022   ALKPHOS 57 02/28/2022   AST 24 02/28/2022   ALT 24 02/28/2022   PROT 7.6 02/28/2022   ALBUMIN 4.5 02/28/2022   CALCIUM 9.8 02/28/2022   GFRAA 89 04/25/2020    Speciality Comments: PLQ Eye Exam: 05/21/2021 WNL  [facility]  Procedures:  Large Joint Inj: bilateral greater trochanter on 03/18/2022 3:46 PM Indications: pain Details: 27 G 1.5 in needle, lateral approach  Arthrogram: No  Medications (Right): 1.5 mL lidocaine 1 %; 40 mg triamcinolone acetonide 40 MG/ML Aspirate (Right): 0 mL Medications (Left): 1.5 mL lidocaine 1 %; 40 mg triamcinolone acetonide 40 MG/ML Aspirate (Left): 0 mL Outcome: tolerated well, no immediate complications Procedure, treatment alternatives, risks and benefits explained, specific risks discussed. Consent was given by the patient. Immediately prior to procedure a time out was called to verify the correct patient, procedure, equipment, support staff and site/side marked as required. Patient was prepped and draped in the usual sterile fashion.     Allergies: Patient has no known allergies.       Assessment / Plan:     Visit Diagnoses: Rheumatoid arthritis with rheumatoid factor of multiple sites without organ or systems involvement [facility]): She has no joint tenderness or synovitis on examination today.  She has not had any signs or symptoms of a rheumatoid arthritis flare.  Her rheumatoid arthritis remains well-controlled taking Plaquenil 200 mg 1 tablet by mouth twice daily.  She is tolerating Plaquenil without any side effects and has not missed any doses recently.  She has not been experiencing any morning stiffness or difficulty with ADLs.  She will remain on Plaquenil as monotherapy.  She was advised to notify us if she develops signs or symptoms of a flare.  She will follow-up in the office in 3 months or sooner if needed.  High risk medication use - Plaquenil 200 mg 1 tablet by mouth twice daily started  on August 14, 2019. CBC and CMP updated on 02/28/22.   PLQ Eye Exam: 05/21/2021 WNL [employer].  She was given a Plaquenil eye examination form to take with her to her next appointment.  Acute pain of right shoulder - Right glenohumeral injection on 12/10/21-improved.  She has good range of motion of the right shoulder with no discomfort at this time.  No tenderness or effusion noted on exam.  Chronic left shoulder pain: Currently asymptomatic.  Good range of motion with no discomfort at this time.  Trochanteric bursitis of both hips - Left trochanteric bursa injection on 12/10/21.  She presents today with increased discomfort in both hips consistent with trochanteric bursitis.  She has good range of motion of both hip joints with no groin pain.  She has been performing stretching exercises but continues to have breakthrough symptoms.  Offered a referral to physical therapy but she declined at this time.  She requested bilateral trochanteric bursa cortisone injections today.  She tolerated the procedures well.  Procedure notes were completed above.  Aftercare was discussed.   She was advised to notify us if her symptoms persist or worsen.  Pes cavus: She is wearing proper fitting shoes.  She is not experiencing any increased discomfort in her feet at this time.  Age-related osteoporosis without current pathological fracture - DEXA updated on 01/08/22: BMD as determined from [facility] L1-L2 is 0.777 g/cm2 with a T-Score of -3.2. Previous DEXA 03/19/2019 BMD as determined from [facility] L1-L2 is 0.763 g/cm2 with a T-Scoreof -3.3.  Fosamax May 2021-advised to discontinue fosamax by her PCP.  She took Fosamax for a total of 3 years.  She has been taking a calcium and vitamin D supplement daily.  No recent falls or fractures. Different treatment options for management of osteoporosis were discussed today.  Recommended proceeding with Forteo daily injections.  She is apprehensive to be on a daily medication.  She is most interested in proceeding with Prolia.  Indications, contraindications, and potential side effects of Prolia were discussed today.  She was given an informational handout about both Prolia and Forteo to further review.  She plans on reaching out to our pharmacist to further discuss if she has any additional questions.  She would like to apply for Prolia to figure out the cost as she would be responsible for every 6 months.  She would like to follow-up in the office in 3 months to further discuss the treatment options.  Discussed that she will require updated lab work prior to initiating therapy including vitamin D.  Other medical conditions are listed as follows:  Essential hypertension: Pressure was 113/73 today in the office.  Dyslipidemia  History of IBS  Seasonal allergies  Elevated LFTs  Orders: Orders Placed This Encounter  Procedures   Large Joint Inj   No orders of the defined types were placed in this encounter.    Follow-Up Instructions: Return in about 3 months (around 06/18/2022) for Rheumatoid arthritis.   Ofilia Neas, PA-C  Note -  This record has been created using Dragon software.  Chart creation errors have been sought, but may not always  have been located. Such creation errors do not reflect on  the standard of medical care.

## 2022-03-18 ENCOUNTER — Ambulatory Visit: Payer: 59 | Attending: Physician Assistant | Admitting: Physician Assistant

## 2022-03-18 ENCOUNTER — Encounter: Payer: Self-pay | Admitting: Physician Assistant

## 2022-03-18 VITALS — BP 113/73 | HR 58 | Resp 12 | Ht 62.0 in | Wt 162.0 lb

## 2022-03-18 DIAGNOSIS — Z79899 Other long term (current) drug therapy: Secondary | ICD-10-CM | POA: Diagnosis not present

## 2022-03-18 DIAGNOSIS — M810 Age-related osteoporosis without current pathological fracture: Secondary | ICD-10-CM

## 2022-03-18 DIAGNOSIS — J302 Other seasonal allergic rhinitis: Secondary | ICD-10-CM

## 2022-03-18 DIAGNOSIS — M7062 Trochanteric bursitis, left hip: Secondary | ICD-10-CM

## 2022-03-18 DIAGNOSIS — E785 Hyperlipidemia, unspecified: Secondary | ICD-10-CM

## 2022-03-18 DIAGNOSIS — M25511 Pain in right shoulder: Secondary | ICD-10-CM

## 2022-03-18 DIAGNOSIS — M0579 Rheumatoid arthritis with rheumatoid factor of multiple sites without organ or systems involvement: Secondary | ICD-10-CM | POA: Diagnosis not present

## 2022-03-18 DIAGNOSIS — M25512 Pain in left shoulder: Secondary | ICD-10-CM

## 2022-03-18 DIAGNOSIS — G8929 Other chronic pain: Secondary | ICD-10-CM

## 2022-03-18 DIAGNOSIS — R7989 Other specified abnormal findings of blood chemistry: Secondary | ICD-10-CM

## 2022-03-18 DIAGNOSIS — M7061 Trochanteric bursitis, right hip: Secondary | ICD-10-CM | POA: Diagnosis not present

## 2022-03-18 DIAGNOSIS — Q667 Congenital pes cavus, unspecified foot: Secondary | ICD-10-CM

## 2022-03-18 DIAGNOSIS — Z8719 Personal history of other diseases of the digestive system: Secondary | ICD-10-CM

## 2022-03-18 DIAGNOSIS — I10 Essential (primary) hypertension: Secondary | ICD-10-CM

## 2022-03-18 MED ORDER — LIDOCAINE HCL 1 % IJ SOLN
1.5000 mL | INTRAMUSCULAR | Status: AC | PRN
  Administered 2022-03-18: 1.5 mL

## 2022-03-18 MED ORDER — TRIAMCINOLONE ACETONIDE 40 MG/ML IJ SUSP
40.0000 mg | INTRAMUSCULAR | Status: AC | PRN
  Administered 2022-03-18: 40 mg via INTRA_ARTICULAR

## 2022-03-19 ENCOUNTER — Telehealth: Payer: Self-pay | Admitting: Pharmacist

## 2022-03-19 ENCOUNTER — Other Ambulatory Visit (HOSPITAL_COMMUNITY): Payer: Self-pay

## 2022-03-19 NOTE — Telephone Encounter (Signed)
Patient had dual Medicare/Medicaid plan.  Pharmacy benefit: Her copay through pharmacy benefit is $0. She would have to come to clinic to receive.  She can also go to [facility] infusion center to receive since it is closer to her home. This would be billed through medical benefit.  Medical benefit:  Per [facility] provider portal, patient has Dual Complete plan that is active. [place] is in-network with this plan. She has $240 annual plan deductible As of 03/19/22, she has met $155.12. Precertification is required for Prolia which has been submitted via [facility] [facility] provider portal. Authorization is pending.  Auth # [id number]  Knox Saliva, PharmD, MPH, BCPS, CPP Clinical Pharmacist (Rheumatology and Pulmonology)

## 2022-03-19 NOTE — Telephone Encounter (Addendum)
Please start PROLIA BIV through medical benefit [place] [id number], [phone]) and pharmacy benefit  Knox Saliva, PharmD, MPH, BCPS, CPP Clinical Pharmacist (Rheumatology and Pulmonology)  ----- Message from Carole Binning, LPN sent at [id number] 10:15 AM EDT ----- Per Hazel Sams, PA-C, please apply for Prolia. Thanks!

## 2022-03-23 NOTE — Telephone Encounter (Signed)
Received a fax regarding Prior Authorization from  [facility]   for [place] through MEDICAL benefits. Authorization has been DENIED because patient must try both oral and IV bisphosphonates.  Patient can receive at our clinic for $0 through pharmacy benefit but there will be admin fee (which would likely be $0 since she has [facility] Dual plan)  She is seeing our clinic again in June 2024 to finalize next steps on osteoporosis treatment.  Knox Saliva, PharmD, MPH, BCPS, CPP Clinical Pharmacist (Rheumatology and Pulmonology)

## 2022-04-14 ENCOUNTER — Encounter (HOSPITAL_COMMUNITY)
Admission: RE | Disposition: A | Discharge status: Home / Self Care | Payer: Self-pay | Source: Home / Self Care | Attending: Gastroenterology

## 2022-04-14 ENCOUNTER — Encounter (HOSPITAL_COMMUNITY): Payer: Self-pay | Admitting: Gastroenterology

## 2022-04-14 ENCOUNTER — Ambulatory Visit (HOSPITAL_COMMUNITY)
Admission: RE | Admit: 2022-04-14 | Discharge: 2022-04-14 | Disposition: A | Discharge status: Home / Self Care | Payer: 59 | Attending: Gastroenterology | Admitting: Gastroenterology

## 2022-04-14 ENCOUNTER — Other Ambulatory Visit: Payer: Self-pay

## 2022-04-14 ENCOUNTER — Ambulatory Visit (HOSPITAL_BASED_OUTPATIENT_CLINIC_OR_DEPARTMENT_OTHER): Payer: 59 | Admitting: Certified Registered Nurse Anesthetist

## 2022-04-14 ENCOUNTER — Encounter (INDEPENDENT_AMBULATORY_CARE_PROVIDER_SITE_OTHER): Payer: Self-pay | Admitting: *Deleted

## 2022-04-14 ENCOUNTER — Ambulatory Visit (HOSPITAL_COMMUNITY): Payer: 59 | Admitting: Certified Registered Nurse Anesthetist

## 2022-04-14 DIAGNOSIS — K648 Other hemorrhoids: Secondary | ICD-10-CM | POA: Insufficient documentation

## 2022-04-14 DIAGNOSIS — M069 Rheumatoid arthritis, unspecified: Secondary | ICD-10-CM | POA: Diagnosis not present

## 2022-04-14 DIAGNOSIS — D123 Benign neoplasm of transverse colon: Secondary | ICD-10-CM

## 2022-04-14 DIAGNOSIS — I10 Essential (primary) hypertension: Secondary | ICD-10-CM | POA: Diagnosis not present

## 2022-04-14 DIAGNOSIS — K5730 Diverticulosis of large intestine without perforation or abscess without bleeding: Secondary | ICD-10-CM

## 2022-04-14 DIAGNOSIS — Z800 Family history of malignant neoplasm of digestive organs: Secondary | ICD-10-CM

## 2022-04-14 DIAGNOSIS — K635 Polyp of colon: Secondary | ICD-10-CM

## 2022-04-14 DIAGNOSIS — E7800 Pure hypercholesterolemia, unspecified: Secondary | ICD-10-CM | POA: Diagnosis not present

## 2022-04-14 DIAGNOSIS — Z1211 Encounter for screening for malignant neoplasm of colon: Secondary | ICD-10-CM | POA: Diagnosis present

## 2022-04-14 HISTORY — PX: POLYPECTOMY: SHX5525

## 2022-04-14 HISTORY — PX: COLONOSCOPY WITH PROPOFOL: SHX5780

## 2022-04-14 HISTORY — PX: BIOPSY: SHX5522

## 2022-04-14 LAB — HM COLONOSCOPY

## 2022-04-14 SURGERY — COLONOSCOPY WITH PROPOFOL
Anesthesia: General

## 2022-04-14 MED ORDER — PROPOFOL 500 MG/50ML IV EMUL
INTRAVENOUS | Status: DC | PRN
  Administered 2022-04-14: 150 ug/kg/min via INTRAVENOUS

## 2022-04-14 MED ORDER — LACTATED RINGERS IV SOLN: INTRAVENOUS | Status: DC | PRN

## 2022-04-14 MED ORDER — STERILE WATER FOR IRRIGATION IR SOLN
Status: DC | PRN
  Administered 2022-04-14: 120 mL

## 2022-04-14 MED ORDER — PROPOFOL 500 MG/50ML IV EMUL
INTRAVENOUS | Status: AC
  Filled 2022-04-14: qty 50

## 2022-04-14 MED ORDER — PROPOFOL 10 MG/ML IV BOLUS
INTRAVENOUS | Status: DC | PRN
  Administered 2022-04-14: 60 mg via INTRAVENOUS

## 2022-04-14 MED ORDER — LACTATED RINGERS IV SOLN: INTRAVENOUS | Status: DC

## 2022-04-14 NOTE — Anesthesia Preprocedure Evaluation (Signed)
Anesthesia Evaluation  Patient identified by MRN, date of birth, ID band Patient awake    Reviewed: Allergy & Precautions, H&P , NPO status , Patient's Chart, lab work & pertinent test results, reviewed documented beta blocker date and time   Airway Mallampati: II  TM Distance: >3 FB Neck ROM: full    Dental no notable dental hx.    Pulmonary neg pulmonary ROS   Pulmonary exam normal breath sounds clear to auscultation       Cardiovascular Exercise Tolerance: Good hypertension, negative cardio ROS + dysrhythmias  Rhythm:regular Rate:Normal     Neuro/Psych negative neurological ROS  negative psych ROS   GI/Hepatic negative GI ROS, Neg liver ROS,,,  Endo/Other  negative endocrine ROS    Renal/GU negative Renal ROS  negative genitourinary   Musculoskeletal  (+) Arthritis , Rheumatoid disorders,    Abdominal   Peds  Hematology negative hematology ROS (+)   Anesthesia Other Findings   Reproductive/Obstetrics negative OB ROS                             Anesthesia Physical Anesthesia Plan  ASA: 2  Anesthesia Plan: General   Post-op Pain Management:    Induction:   PONV Risk Score and Plan: Propofol infusion  Airway Management Planned:   Additional Equipment:   Intra-op Plan:   Post-operative Plan:   Informed Consent: I have reviewed the patients History and Physical, chart, labs and discussed the procedure including the risks, benefits and alternatives for the proposed anesthesia with the patient or authorized representative who has indicated his/her understanding and acceptance.     Dental Advisory Given  Plan Discussed with: CRNA  Anesthesia Plan Comments:        Anesthesia Quick Evaluation

## 2022-04-14 NOTE — Anesthesia Procedure Notes (Signed)
Procedure Name: General with mask airway Date/Time: 04/14/2022 9:02 AM  Performed by: Cy Blamer, CRNAPre-anesthesia Checklist: Patient identified, Emergency Drugs available, Suction available, Patient being monitored and Timeout performed Patient Re-evaluated:Patient Re-evaluated prior to induction Oxygen Delivery Method: Nasal cannula Placement Confirmation: positive ETCO2 Dental Injury: Teeth and Oropharynx as per pre-operative assessment

## 2022-04-14 NOTE — Op Note (Addendum)
[facility] Patient Name: Tammy Jensen Procedure Date: 04/14/2022 8:53 AM MRN: [id number] Date of Birth: 1950/06/01 Attending MD: Katrinka Blazing , , [id number] CSN: [id number] Age: 72 Admit Type: Outpatient Procedure:                Colonoscopy Indications:              Screening patient at increased risk: Family history                            of colorectal cancer in multiple 1st-degree                            relatives Providers:                Katrinka Blazing, Edrick Kins, RN, Cyril Mourning, Technician Referring MD:              Medicines:                Monitored Anesthesia Care Complications:            No immediate complications. Estimated Blood Loss:     Estimated blood loss: none. Procedure:                Pre-Anesthesia Assessment:                           - Prior to the procedure, a History and Physical                            was performed, and patient medications, allergies                            and sensitivities were reviewed. The patient's                            tolerance of previous anesthesia was reviewed.                           - The risks and benefits of the procedure and the                            sedation options and risks were discussed with the                            patient. All questions were answered and informed                            consent was obtained.                           - ASA Grade Assessment: II - A patient with mild                            systemic disease.  After obtaining informed consent, the colonoscope                            was passed under direct vision. Throughout the                            procedure, the patient's blood pressure, pulse, and                            oxygen saturations were monitored continuously. The                            [id number]-[id number] ([id number] scope was introduced through                             the anus and advanced to the the cecum, identified                            by appendiceal orifice and ileocecal valve. The                            colonoscopy was performed without difficulty. The                            patient tolerated the procedure well. The quality                            of the bowel preparation was excellent. Scope In: 9:09:26 AM Scope Out: 9:26:57 AM Scope Withdrawal Time: 0 hours 13 minutes 39 seconds  Total Procedure Duration: 0 hours 17 minutes 31 seconds  Findings:      The perianal and digital rectal examinations were normal.      Two sessile polyps were found in the transverse colon. The polyps were 3       to 4 mm in size. These polyps were removed with a cold snare. Resection       and retrieval were complete.      Two sessile polyps were found in the transverse colon. The polyps were 1       mm in size. These polyps were removed with a cold biopsy forceps.       Resection and retrieval were complete.      A few small-mouthed diverticula were found in the sigmoid colon.      Non-bleeding internal hemorrhoids were found during retroflexion. The       hemorrhoids were small. Impression:               - Two 3 to 4 mm polyps in the transverse colon,                            removed with a cold snare. Resected and retrieved.                           - Two 1 mm polyps in the transverse colon, removed  with a cold biopsy forceps. Resected and retrieved.                           - Diverticulosis in the sigmoid colon.                           - Non-bleeding internal hemorrhoids. Moderate Sedation:      Per Anesthesia Care Recommendation:           - Discharge patient to home (ambulatory).                           - Resume previous diet.                           - Await pathology results.                           - Repeat colonoscopy in 3 years for surveillance. Procedure Code(s):        --- Professional ---                            [phone], Colonoscopy, flexible; with removal of                            tumor(s), polyp(s), or other lesion(s) by snare                            technique                           45380, 59, Colonoscopy, flexible; with biopsy,                            single or multiple Diagnosis Code(s):        --- Professional ---                           Z80.0, Family history of malignant neoplasm of                            digestive organs                           D12.3, Benign neoplasm of transverse colon (hepatic                            flexure or splenic flexure)                           K57.30, Diverticulosis of large intestine without                            perforation or abscess without bleeding CPT copyright 2022 American Medical Association. All rights reserved. The codes documented in this report are preliminary and upon coder review may  be revised to meet current compliance requirements. Katrinka Blazing, MD Katrinka Blazing,  04/14/2022 9:33:27  AM This report has been signed electronically. Number of Addenda: 0

## 2022-04-14 NOTE — Discharge Instructions (Addendum)
You are being discharged to home.  Resume your previous diet.  We are waiting for your pathology results.  Your physician has recommended a repeat colonoscopy in 3 years

## 2022-04-14 NOTE — Transfer of Care (Signed)
Immediate Anesthesia Transfer of Care Note  Patient: Tammy Jensen  Procedure(s) Performed: COLONOSCOPY WITH PROPOFOL POLYPECTOMY BIOPSY  Patient Location: Endoscopy Unit  Anesthesia Type:General  Level of Consciousness: awake, alert , and oriented  Airway & Oxygen Therapy: Patient Spontanous Breathing  Post-op Assessment: Report given to RN, Post -op Vital signs reviewed and stable, Patient moving all extremities X 4, and Patient able to stick tongue midline  Post vital signs: Reviewed  Last Vitals:  Vitals Value Taken Time  BP 97/59 04/14/22 0930  Temp 36.9 C 04/14/22 0930  Pulse 73 04/14/22 0930  Resp 19 04/14/22 0930  SpO2 98 % 04/14/22 0930    Last Pain:  Vitals:   04/14/22 0930  TempSrc: Axillary  PainSc: 0-No pain      Patients Stated Pain Goal: 10 (04/14/22 0758)  Complications: No notable events documented.

## 2022-04-14 NOTE — H&P (Signed)
Tammy Jensen is an 72 y.o. female.   Chief Complaint: family history of colon cancer HPI: Tammy Jensen is a 72 y.o. female with past medical history of high cholesterol, dysrhythmia, HTN, IBS, RA, who comes for evaluation of family history of colon cancer.   Last colonoscopy in 2018, no polyps. The patient denies having any complaints such as melena, hematochezia, abdominal pain or distention, change in her bowel movement consistency or frequency, no changes in weight recently. Father and sister had cancer (older than 1660)   Past Medical History:  Diagnosis Date   Dysrhythmia    High cholesterol    Hypertension    IBS (irritable bowel syndrome)    Rheumatoid arthritis     Past Surgical History:  Procedure Laterality Date   COLONOSCOPY N/A 10/07/2016   Procedure: COLONOSCOPY;  Surgeon: Malissa Hippoehman, Najeeb U, MD;  Location: [facility];  Service: Endoscopy;  Laterality: N/A;  730   RECONSTRUCTION OF EYELID  09/02/2021   TUBAL LIGATION     VAGINAL HYSTERECTOMY N/A 10/07/2021   Procedure: HYSTERECTOMY VAGINAL;  Surgeon: Lazaro ArmsEure, Luther H, MD;  Location: [facility];  Service: Gynecology;  Laterality: N/A;    Family History  Problem Relation Age of Onset   Colon cancer Father    Colon cancer Sister    Heart disease Mother    Dementia Mother    Arthritis Mother    Hemachromatosis Son    Hemachromatosis Son    Social History:  reports that she has never smoked. She has never been exposed to tobacco smoke. She has never used smokeless tobacco. She reports that she does not currently use alcohol. She reports that she does not use drugs.  Allergies: No Known Allergies  Medications Prior to Admission  Medication Sig Dispense Refill   amLODipine (NORVASC) 5 MG tablet Take 5 mg by mouth in the morning.     ARTIFICIAL TEAR SOLUTION OP Place 1 drop into both eyes 3 (three) times daily as needed (dry/irritated eyes.).     aspirin EC 81 MG tablet Take 81 mg by mouth in the morning.      benazepril (LOTENSIN) 40 MG tablet Take 40 mg by mouth in the morning.     Cholecalciferol 50 MCG (2000 UT) TABS Take 2,000 Units by mouth in the morning.     cyanocobalamin (,VITAMIN B-12,) 1000 MCG/ML injection Inject 1,000 mcg into the muscle every 30 (thirty) days.     fexofenadine (ALLEGRA) 60 MG tablet Take 60 mg by mouth daily as needed for allergies or rhinitis.     fluticasone (FLONASE) 50 MCG/ACT nasal spray Place into both nostrils daily as needed for allergies or rhinitis.     furosemide (LASIX) 20 MG tablet Take 20 mg by mouth every other day. In the morning     hydroxychloroquine (PLAQUENIL) 200 MG tablet Take 1 tablet by mouth twice daily 60 tablet 0   naproxen (NAPROSYN) 375 MG tablet Take 375 mg by mouth daily as needed (shoulder pain.).     PEG 3350-KCl-NaBcb-NaCl-NaSulf (PEG-3350/ELECTROLYTES) 236 g SOLR SMARTSIG:Milliliter(s) By Mouth     rosuvastatin (CRESTOR) 10 MG tablet Take 10 mg by mouth in the morning.     traZODone (DESYREL) 50 MG tablet Take 50 mg by mouth at bedtime as needed for sleep.     predniSONE (DELTASONE) 5 MG tablet Take 4 tabs po x 2 days, 3  tabs po x 2 days, 2 tabs po x 2 days, 1  tab po x 2 days (Patient  not taking: Reported on 04/09/2022) 20 tablet 0    No results found for this or any previous visit (from the past 48 hour(s)). No results found.  Review of Systems  All other systems reviewed and are negative.   Blood pressure (!) 152/99, pulse 92, temperature 98.1 F (36.7 C), temperature source Oral, resp. rate 16, height 5\' 2"  (1.575 m), weight 72.6 kg, SpO2 98 %. Physical Exam  GENERAL: The patient is AO x3, in no acute distress. HEENT: Head is normocephalic and atraumatic. EOMI are intact. Mouth is well hydrated and without lesions. NECK: Supple. No masses LUNGS: Clear to auscultation. No presence of rhonchi/wheezing/rales. Adequate chest expansion HEART: RRR, normal s1 and s2. ABDOMEN: Soft, nontender, no guarding, no peritoneal signs, and  nondistended. BS +. No masses. EXTREMITIES: Without any cyanosis, clubbing, rash, lesions or edema. NEUROLOGIC: AOx3, no focal motor deficit. SKIN: no jaundice, no rashes  Assessment/Plan Tammy Jensen is a 72 y.o. female with past medical history of high cholesterol, dysrhythmia, HTN, IBS, RA, who comes for evaluation of family history of colon cancer.  We will proceed with colonoscopy.  Dolores Frame, MD 04/14/2022, 8:20 AM

## 2022-04-15 LAB — SURGICAL PATHOLOGY

## 2022-04-16 NOTE — Anesthesia Postprocedure Evaluation (Signed)
Anesthesia Post Note  Patient: Tammy Jensen  Procedure(s) Performed: COLONOSCOPY WITH PROPOFOL POLYPECTOMY BIOPSY  Patient location during evaluation: Phase II Anesthesia Type: General Level of consciousness: awake Pain management: pain level controlled Vital Signs Assessment: post-procedure vital signs reviewed and stable Respiratory status: spontaneous breathing and respiratory function stable Cardiovascular status: blood pressure returned to baseline and stable Postop Assessment: no headache and no apparent nausea or vomiting Anesthetic complications: no Comments: Late entry   No notable events documented.   Last Vitals:  Vitals:   04/14/22 0930 04/14/22 0932  BP: (!) 88/51 (!) 97/59  Pulse: 73 76  Resp: 19 20  Temp: 36.9 C   SpO2: 98% 98%    Last Pain:  Vitals:   04/14/22 0930  TempSrc: Axillary  PainSc: 0-No pain                 Windell Norfolk

## 2022-04-21 ENCOUNTER — Encounter (HOSPITAL_COMMUNITY): Payer: Self-pay | Admitting: Gastroenterology

## 2022-06-04 NOTE — Progress Notes (Signed)
Office Visit Note  Patient: Tammy Jensen             Date of Birth: 07-Nov-1950           MRN: [id number]             PCP: Joana Reamer, DO Referring: Joana Reamer, DO Visit Date: 06/18/2022 Occupation: @GUAROCC @  Subjective:  Discuss osteoporosis treatment   History of Present Illness: Tammy Jensen is a 72 y.o. female with history of seropositive rheumatoid arthritis.  Patient remains on plaquenil 200 mg 1 tablet by mouth twice daily.  She is tolerating Plaquenil without any side effects and has not missed any doses recently.  She denies any recent rheumatoid arthritis flares.  Patient presents today with recurrence of trochanteric bursitis of both hips.  She is also had some increased discomfort in her lower back.  At times she has difficulty walking due to the severity of pain.  Patient had bilateral trochanteric bursa cortisone injections performed on 03/18/2022 which provided significant relief but her symptoms have recurred.  She requested bilateral trochanteric bursa cortisone injections today. Patient states that she is ready to proceed with initiating Prolia.    Activities of Daily Living:  Patient reports morning stiffness for all day. Patient Reports nocturnal pain.  Difficulty dressing/grooming: Denies Difficulty climbing stairs: Denies Difficulty getting out of chair: Denies Difficulty using hands for taps, buttons, cutlery, and/or writing: Denies  Review of Systems  Constitutional:  Positive for fatigue.  HENT:  Positive for mouth dryness. Negative for mouth sores.   Eyes:  Negative for dryness.  Respiratory:  Positive for shortness of breath.   Cardiovascular:  Negative for chest pain and palpitations.  Gastrointestinal:  Positive for constipation and diarrhea. Negative for blood in stool.  Endocrine: Negative for increased urination.  Genitourinary:  Negative for involuntary urination.  Musculoskeletal:  Positive for joint pain, joint pain,  myalgias, morning stiffness and myalgias. Negative for gait problem, joint swelling, muscle weakness and muscle tenderness.  Skin:  Negative for color change, rash, hair loss and sensitivity to sunlight.  Allergic/Immunologic: Negative for susceptible to infections.  Neurological:  Positive for dizziness. Negative for headaches.  Hematological:  Negative for swollen glands.  Psychiatric/Behavioral:  Positive for sleep disturbance. Negative for depressed mood. The patient is not nervous/anxious.     PMFS History:  Patient Active Problem List   Diagnosis Date Noted   Constipation 03/11/2022   Liver hemangioma 03/11/2022   Rheumatoid arthritis with rheumatoid factor of multiple sites without organ or systems involvement (HCC) 09/06/2019   High risk medication use 09/06/2019   Elevated LFTs 09/06/2019   Age-related osteoporosis without current pathological fracture 09/06/2019   Dyslipidemia 08/14/2019   Abnormal electrocardiogram 03/12/2019   Chest pain 03/12/2019   HTN (hypertension) 03/12/2019   Obesity 03/12/2019   Family history of colon cancer 05/28/2016    Past Medical History:  Diagnosis Date   Dysrhythmia    High cholesterol    Hypertension    IBS (irritable bowel syndrome)    Rheumatoid arthritis (HCC)     Family History  Problem Relation Age of Onset   Colon cancer Father    Colon cancer Sister    Heart disease Mother    Dementia Mother    Arthritis Mother    Hemachromatosis Son    Hemachromatosis Son    Past Surgical History:  Procedure Laterality Date   BIOPSY  04/14/2022   Procedure: BIOPSY;  Surgeon: Dolores Frame, MD;  [facility];  Service: Gastroenterology;;   COLONOSCOPY N/A 10/07/2016   Procedure: COLONOSCOPY;  Surgeon: Malissa Hippo, MD;  Location: [facility];  Service: Endoscopy;  Laterality: N/A;  730   COLONOSCOPY WITH PROPOFOL N/A 04/14/2022   Procedure: COLONOSCOPY WITH PROPOFOL;  Surgeon: Dolores Frame, MD;   Location: [facility];  Service: Gastroenterology;  Laterality: N/A;  915am, asa 2   POLYPECTOMY  04/14/2022   Procedure: POLYPECTOMY;  Surgeon: Dolores Frame, MD;  Location: [facility];  Service: Gastroenterology;;   RECONSTRUCTION OF EYELID  09/02/2021   TUBAL LIGATION     VAGINAL HYSTERECTOMY N/A 10/07/2021   Procedure: HYSTERECTOMY VAGINAL;  Surgeon: Lazaro Arms, MD;  Location: [facility];  Service: Gynecology;  Laterality: N/A;   Social History   Social History Narrative   Not on file   Immunization History  Administered Date(s) Administered   PFIZER(Purple Top)SARS-COV-2 Vaccination 09/20/2019, 10/15/2019     Objective: Vital Signs: BP 139/87 (BP Location: Left Arm, Patient Position: Sitting, Cuff Size: Normal)   Pulse (!) 103   Resp 15   Ht 5\' 2"  (1.575 m)   Wt 158 lb 3.2 oz (71.8 kg)   BMI 28.94 kg/m    Physical Exam Vitals and nursing note reviewed.  Constitutional:      Appearance: She is well-developed.  HENT:     Head: Normocephalic and atraumatic.  Eyes:     Conjunctiva/sclera: Conjunctivae normal.  Cardiovascular:     Rate and Rhythm: Rhythm irregular.     Heart sounds: Normal heart sounds.  Pulmonary:     Effort: Pulmonary effort is normal.     Breath sounds: Normal breath sounds.  Abdominal:     General: Bowel sounds are normal.     Palpations: Abdomen is soft.  Musculoskeletal:     Cervical back: Normal range of motion.  Skin:    General: Skin is warm and dry.     Capillary Refill: Capillary refill takes less than 2 seconds.  Neurological:     Mental Status: She is alert and oriented to person, place, and time.  Psychiatric:        Behavior: Behavior normal.      Musculoskeletal Exam: C-spine, thoracic spine, and lumbar spine good ROM.  Shoulder joints have good ROM with some discomfort in the right shoulder.  Elbow joints, wrist joints, MCPs, PIPs, DIPs have good range of motion with no synovitis.  PIP and DIP thickening  consistent with osteoarthritis of both hands.  Hip joints have good range of motion with no groin pain.  Tenderness over bilateral trochanteric bursa.  Knee joints have good range of motion with no warmth or effusion.  Ankle joints have good range of motion with no tenderness or joint swelling.  CDAI Exam: CDAI Score: -- Patient Global: 20 / 100; Provider Global: 20 / 100 Swollen: --; Tender: -- Joint Exam 06/18/2022   No joint exam has been documented for this visit   There is currently no information documented on the homunculus. Go to the Rheumatology activity and complete the homunculus joint exam.  Investigation: No additional findings.  Imaging: No results found.  Recent Labs: Lab Results  Component Value Date   WBC 10.2 02/28/2022   HGB 15.8 (H) 02/28/2022   PLT 274 02/28/2022   NA 137 02/28/2022   K 4.4 02/28/2022   CL 105 02/28/2022   CO2 21 (L) 02/28/2022   GLUCOSE 127 (H) 02/28/2022   BUN 23 02/28/2022  CREATININE 0.93 02/28/2022   BILITOT 1.7 (H) 02/28/2022   ALKPHOS 57 02/28/2022   AST 24 02/28/2022   ALT 24 02/28/2022   PROT 7.6 02/28/2022   ALBUMIN 4.5 02/28/2022   CALCIUM 9.8 02/28/2022   GFRAA 89 04/25/2020    Speciality Comments: PLQ Eye Exam: 05/17/2022 WNL  [employer]. Follow up in 1 year.  Procedures:  Large Joint Inj: bilateral greater trochanter on 06/18/2022 9:38 AM Indications: pain Details: 27 G 1.5 in needle, lateral approach  Arthrogram: No  Medications (Right): 1.5 mL lidocaine 1 %; 40 mg triamcinolone acetonide 40 MG/ML Aspirate (Right): 0 mL Medications (Left): 1.5 mL lidocaine 1 %; 40 mg triamcinolone acetonide 40 MG/ML Aspirate (Left): 0 mL Outcome: tolerated well, no immediate complications Procedure, treatment alternatives, risks and benefits explained, specific risks discussed. Consent was given by the patient. Immediately prior to procedure a time out was called to verify the correct patient, procedure, equipment,  support staff and site/side marked as required. Patient was prepped and draped in the usual sterile fashion.     Allergies: Patient has no known allergies.      Assessment / Plan:     Visit Diagnoses: Rheumatoid arthritis with rheumatoid factor of multiple sites without organ or systems involvement [facility]): She has no synovitis on examination today.  She has not had any signs or symptoms of a rheumatoid arthritis flare.  She has clinically been doing well taking Plaquenil 200 mg 1 tablet by mouth twice daily as prescribed.  She has been tolerating Plaquenil without any side effects and has not missed any doses recently.  No medication changes will be made at this time.  A refill of Plaquenil was sent to the pharmacy today.  She was advised to notify us if she develops signs or symptoms of a flare.  She will follow-up in the office in 5 months or sooner if needed.  High risk medication use - Plaquenil 200 mg 1 tablet by mouth twice daily started on August 14, 2019.  PLQ Eye Exam: 05/17/2022 WNL [employer]. Follow up in 1 year.  CBC and CMP updated on 02/28/22.  Orders for CBC and CMP were released today. Lab work will be due in September and every 3 months to monitor for drug toxicity. - Plan: CBC with Differential/Platelet, COMPLETE METABOLIC PANEL WITH GFR  Acute pain of right shoulder: She continues to experience intermittent discomfort in her right shoulder.  She has good range of motion on examination today with some discomfort.  Chronic left shoulder pain: Good range of motion of the left shoulder was noted on examination today.  Trochanteric bursitis of both hips: Patient presents today with a recurrence of trochanteric bursitis in both hips.  She had cortisone injections on 03/18/2022 which provided significant relief but her symptoms have recurred about 2 weeks ago.  On examination she has good range of motion of both hip joints with no groin pain.  Tenderness over bilateral  trochanteric bursa noted.  Different treatment options were discussed.  Discouraged the use of frequent corticosteroid use especially given history of osteoporosis.  Discouraged repeat cortisone injections today and offered a referral to physical therapy.  The patient requested to have repeat cortisone injections today but is open to scheduling physical therapy close to home.  A referral to PT was placed today. Procedure notes were completed above.  Aftercare was discussed.  Lumbar facet arthropathy: X-rays of the lumbar spine were obtained on 04/25/2020 which were consistent with lumbar facet  arthropathy.  Levoscoliosis was also noted.  She is been experiencing intermittent discomfort in her lower back which causes referred pain into her buttocks and sides of both hips at times.  Previously under the care of Dr. Romeo Apple.  Patient requested a new referral to reestablish care with Dr. Romeo Apple.  A referral to physical therapy will also be placed.  Pes cavus: She is wearing proper fitting shoes.   Age-related osteoporosis without current pathological fracture:  DEXA updated on 01/08/22: BMD as determined from [facility] L1-L2 is 0.777 g/cm2 with a T-Score of -3.2. Previous DEXA 03/19/2019 BMD as determined from [facility] L1-L2 is 0.763 g/cm2 with a T-Scoreof -3.3.  Fosamax May 2021-advised to discontinue fosamax by her PCP.  She took Fosamax for a total of 3 years. Discussed the importance of trying to avoid corticosteroid and oral prednisone use.  The use of either Prolia or Forteo were discussed with the patient at her last appointment on 03/18/2022.  She has decided to proceed with Prolia which has been approved by her insurance. Plan to proceed with initiating Prolia pending lab results- CBC, CMP, vitamin D will be checked today.  Vitamin D deficiency -She is taking vitamin D 2000 units daily.  Vitamin D level will be checked today prior to initiating Prolia.  Plan: VITAMIN D 25 Hydroxy (Vit-D Deficiency,  Fractures)  Other medical conditions are listed as follows:  Essential hypertension: Blood pressure was 139/87 today in the office.  Patient was advised to monitor blood pressure closely following the cortisone injections today.  Dyslipidemia  History of IBS  Seasonal allergies  Elevated LFTs: LFTs were within normal limits on 02/28/2022.   Orders: Orders Placed This Encounter  Procedures   Large Joint Inj: bilateral greater trochanter   CBC with Differential/Platelet   COMPLETE METABOLIC PANEL WITH GFR   VITAMIN D 25 Hydroxy (Vit-D Deficiency, Fractures)   Ambulatory referral to Physical Therapy   Ambulatory referral to Orthopedics   Meds ordered this encounter  Medications   hydroxychloroquine (PLAQUENIL) 200 MG tablet    Sig: Take 1 tablet (200 mg total) by mouth 2 (two) times daily.    Dispense:  180 tablet    Refill:  0     Follow-Up Instructions: Return in about 5 months (around 11/18/2022) for Rheumatoid arthritis, Osteoporosis.   Gearldine Bienenstock, PA-C  Note - This record has been created using Dragon software.  Chart creation errors have been sought, but may not always  have been located. Such creation errors do not reflect on  the standard of medical care.

## 2022-06-18 ENCOUNTER — Other Ambulatory Visit (HOSPITAL_COMMUNITY): Payer: Self-pay

## 2022-06-18 ENCOUNTER — Ambulatory Visit: Payer: 59 | Attending: Physician Assistant | Admitting: Physician Assistant

## 2022-06-18 ENCOUNTER — Encounter: Payer: Self-pay | Admitting: Physician Assistant

## 2022-06-18 ENCOUNTER — Telehealth: Payer: Self-pay | Admitting: Pharmacist

## 2022-06-18 VITALS — BP 139/87 | HR 103 | Resp 15 | Ht 62.0 in | Wt 158.2 lb

## 2022-06-18 DIAGNOSIS — M7061 Trochanteric bursitis, right hip: Secondary | ICD-10-CM | POA: Diagnosis not present

## 2022-06-18 DIAGNOSIS — Z79899 Other long term (current) drug therapy: Secondary | ICD-10-CM

## 2022-06-18 DIAGNOSIS — M25511 Pain in right shoulder: Secondary | ICD-10-CM | POA: Diagnosis not present

## 2022-06-18 DIAGNOSIS — M0579 Rheumatoid arthritis with rheumatoid factor of multiple sites without organ or systems involvement: Secondary | ICD-10-CM

## 2022-06-18 DIAGNOSIS — J302 Other seasonal allergic rhinitis: Secondary | ICD-10-CM

## 2022-06-18 DIAGNOSIS — I10 Essential (primary) hypertension: Secondary | ICD-10-CM

## 2022-06-18 DIAGNOSIS — M7062 Trochanteric bursitis, left hip: Secondary | ICD-10-CM

## 2022-06-18 DIAGNOSIS — R7989 Other specified abnormal findings of blood chemistry: Secondary | ICD-10-CM

## 2022-06-18 DIAGNOSIS — M810 Age-related osteoporosis without current pathological fracture: Secondary | ICD-10-CM

## 2022-06-18 DIAGNOSIS — M25512 Pain in left shoulder: Secondary | ICD-10-CM

## 2022-06-18 DIAGNOSIS — E559 Vitamin D deficiency, unspecified: Secondary | ICD-10-CM

## 2022-06-18 DIAGNOSIS — G8929 Other chronic pain: Secondary | ICD-10-CM

## 2022-06-18 DIAGNOSIS — Z8719 Personal history of other diseases of the digestive system: Secondary | ICD-10-CM

## 2022-06-18 DIAGNOSIS — E785 Hyperlipidemia, unspecified: Secondary | ICD-10-CM

## 2022-06-18 DIAGNOSIS — M47816 Spondylosis without myelopathy or radiculopathy, lumbar region: Secondary | ICD-10-CM

## 2022-06-18 DIAGNOSIS — Q667 Congenital pes cavus, unspecified foot: Secondary | ICD-10-CM

## 2022-06-18 LAB — CBC WITH DIFFERENTIAL/PLATELET
Basophils Absolute: 87 cells/uL (ref 0–200)
HCT: 46.1 % — ABNORMAL HIGH (ref 35.0–45.0)
MCHC: 33 g/dL (ref 32.0–36.0)
MPV: 10.9 fL (ref 7.5–12.5)
Neutrophils Relative %: 63.7 %
RDW: 12.8 % (ref 11.0–15.0)
Total Lymphocyte: 26.6 %

## 2022-06-18 MED ORDER — HYDROXYCHLOROQUINE SULFATE 200 MG PO TABS: 200.0000 mg | ORAL_TABLET | Freq: Two times a day (BID) | ORAL | 0 refills | Status: DC

## 2022-06-18 MED ORDER — LIDOCAINE HCL 1 % IJ SOLN
1.5000 mL | INTRAMUSCULAR | Status: AC | PRN
Start: 2022-06-18 — End: 2022-06-18
  Administered 2022-06-18: 1.5 mL

## 2022-06-18 MED ORDER — TRIAMCINOLONE ACETONIDE 40 MG/ML IJ SUSP
40.0000 mg | INTRAMUSCULAR | Status: AC | PRN
Start: 2022-06-18 — End: 2022-06-18
  Administered 2022-06-18: 40 mg via INTRA_ARTICULAR

## 2022-06-18 NOTE — Patient Instructions (Signed)
Hip Bursitis Rehab Ask your health care provider which exercises are safe for you. Do exercises exactly as told by your health care provider and adjust them as directed. It is normal to feel mild stretching, pulling, tightness, or discomfort as you do these exercises. Stop right away if you feel sudden pain or your pain gets worse. Do not begin these exercises until told by your health care provider. Stretching exercise This exercise warms up your muscles and joints and improves the movement and flexibility of your hip. This exercise also helps to relieve pain and stiffness. Iliotibial band stretch An iliotibial band is a strong band of muscle tissue that runs from the outer side of your hip to the outer side of your thigh and knee. Lie on your side with your left / right leg in the top position. Bend your left / right knee and grab your ankle. Stretch out your bottom arm to help you balance. Slowly bring your knee back so your thigh is slightly behind your body. Slowly lower your knee toward the floor until you feel a gentle stretch on the outside of your left / right thigh. If you do not feel a stretch and your knee will not lower more toward the floor, place the heel of your other foot on top of your knee and pull your knee down toward the floor with your foot. Hold this position for __________ seconds. Slowly return to the starting position. Repeat __________ times. Complete this exercise __________ times a day. Strengthening exercises These exercises build strength and endurance in your hip and pelvis. Endurance is the ability to use your muscles for a long time, even after they get tired. Bridge This exercise strengthens the muscles that move your thigh backward (hip extensors). Lie on your back on a firm surface with your knees bent and your feet flat on the floor. Tighten your buttocks muscles and lift your buttocks off the floor until your trunk is level with your thighs. Do not arch your  back. You should feel the muscles working in your buttocks and the back of your thighs. If you do not feel these muscles, slide your feet 1-2 inches (2.5-5 cm) farther away from your buttocks. If this exercise is too easy, try doing it with your arms crossed over your chest. Hold this position for __________ seconds. Slowly lower your hips to the starting position. Let your muscles relax completely after each repetition. Repeat __________ times. Complete this exercise __________ times a day. Squats This exercise strengthens the muscles in front of your thigh and knee (quadriceps). Stand in front of a table, with your feet and knees pointing straight ahead. You may rest your hands on the table for balance but not for support. Slowly bend your knees and lower your hips like you are going to sit in a chair. Keep your weight over your heels, not over your toes. Keep your lower legs upright so they are parallel with the table legs. Do not let your hips go lower than your knees. Do not bend lower than told by your health care provider. If your hip pain increases, do not bend as low. Hold the squat position for __________ seconds. Slowly push with your legs to return to standing. Do not use your hands to pull yourself to standing. Repeat __________ times. Complete this exercise __________ times a day. Hip hike  Stand sideways on a bottom step. Stand on your left / right leg with your other foot unsupported next to   the step. You can hold on to the railing or wall for balance if needed. Keep your knees straight and your torso square. Then lift your left / right hip up toward the ceiling. Hold this position for __________ seconds. Slowly let your left / right hip lower toward the floor, past the starting position. Your foot should get closer to the floor. Do not lean or bend your knees. Repeat __________ times. Complete this exercise __________ times a day. Single leg stand This exercise increases  your balance. Without shoes, stand near a railing or in a doorway. You may hold on to the railing or door frame as needed for balance. Squeeze your left / right buttock muscles, then lift up your other foot. Do not let your left / right hip push out to the side. It is helpful to stand in front of a mirror for this exercise so you can watch your hip. Hold this position for __________ seconds. Repeat __________ times. Complete this exercise __________ times a day. This information is not intended to replace advice given to you by your health care provider. Make sure you discuss any questions you have with your health care provider. Document Revised: 12/03/2020 Document Reviewed: 12/03/2020 Elsevier Patient Education  2024 [employer]  

## 2022-06-18 NOTE — Telephone Encounter (Signed)
Prolia benefits: - Pharmacy benefits: Per test claim, copay is $0.  - Medical benefits: pre-certification for Prolia (Z6109) resubmitted today ad is pending in [facility] provider portal ([id number])  Chesley Mires, PharmD, MPH, BCPS, CPP Clinical Pharmacist (Rheumatology and Pulmonology)

## 2022-06-19 LAB — CBC WITH DIFFERENTIAL/PLATELET
Absolute Monocytes: 537 cells/uL (ref 200–950)
Basophils Relative: 1.1 %
Eosinophils Absolute: 142 cells/uL (ref 15–500)
Eosinophils Relative: 1.8 %
Hemoglobin: 15.2 g/dL (ref 11.7–15.5)
Lymphs Abs: 2101 cells/uL (ref 850–3900)
MCH: 29.9 pg (ref 27.0–33.0)
MCV: 90.7 fL (ref 80.0–100.0)
Monocytes Relative: 6.8 %
Neutro Abs: 5032 cells/uL (ref 1500–7800)
Platelets: 247 10*3/uL (ref 140–400)
RBC: 5.08 10*6/uL (ref 3.80–5.10)
WBC: 7.9 10*3/uL (ref 3.8–10.8)

## 2022-06-19 LAB — COMPLETE METABOLIC PANEL WITH GFR
AG Ratio: 1.5 (calc) (ref 1.0–2.5)
ALT: 22 U/L (ref 6–29)
AST: 23 U/L (ref 10–35)
Albumin: 4.3 g/dL (ref 3.6–5.1)
Alkaline phosphatase (APISO): 56 U/L (ref 37–153)
BUN: 19 mg/dL (ref 7–25)
CO2: 26 mmol/L (ref 20–32)
Calcium: 10 mg/dL (ref 8.6–10.4)
Chloride: 105 mmol/L (ref 98–110)
Creat: 0.82 mg/dL (ref 0.60–1.00)
Globulin: 2.8 g/dL (calc) (ref 1.9–3.7)
Glucose, Bld: 84 mg/dL (ref 65–99)
Potassium: 3.9 mmol/L (ref 3.5–5.3)
Sodium: 141 mmol/L (ref 135–146)
Total Bilirubin: 0.8 mg/dL (ref 0.2–1.2)
Total Protein: 7.1 g/dL (ref 6.1–8.1)
eGFR: 76 mL/min/{1.73_m2} (ref >=60)

## 2022-06-19 LAB — VITAMIN D 25 HYDROXY (VIT D DEFICIENCY, FRACTURES): Vit D, 25-Hydroxy: 40 ng/mL (ref 30–100)

## 2022-06-21 NOTE — Progress Notes (Signed)
CBC and CMP WNL. Vitamin D WNL

## 2022-06-22 NOTE — Telephone Encounter (Signed)
Received a fax regarding Prior Authorization from  [facility] MEDICARE  for PROLIA through PART B. Authorization has been DENIED because patient must try and fail both oral and injectable bisphosphonates. Will move forward with appeal  Chesley Mires, PharmD, MPH, BCPS, CPP Clinical Pharmacist (Rheumatology and Pulmonology)

## 2022-06-22 NOTE — Telephone Encounter (Signed)
Submitted a standard appeal to [facility] MEDICARE for PROLIA.  Reference # [id number] Phone: [phone] Fax: [phone]  Darolyn Rua, PharmD Student [employer]

## 2022-06-24 NOTE — Telephone Encounter (Signed)
Submitted clinical questions for follow up to [facility] MEDICARE appeals department.  Faxed: [phone], Belva Agee Case #: [id number]  Darolyn Rua, PharmD Student [employer]

## 2022-06-28 ENCOUNTER — Ambulatory Visit (INDEPENDENT_AMBULATORY_CARE_PROVIDER_SITE_OTHER): Payer: 59 | Admitting: Orthopedic Surgery

## 2022-06-28 ENCOUNTER — Other Ambulatory Visit (INDEPENDENT_AMBULATORY_CARE_PROVIDER_SITE_OTHER): Payer: 59

## 2022-06-28 ENCOUNTER — Encounter: Payer: Self-pay | Admitting: Orthopedic Surgery

## 2022-06-28 VITALS — BP 131/76 | HR 65 | Ht 62.0 in | Wt 155.0 lb

## 2022-06-28 DIAGNOSIS — M25551 Pain in right hip: Secondary | ICD-10-CM

## 2022-06-28 DIAGNOSIS — M25511 Pain in right shoulder: Secondary | ICD-10-CM | POA: Diagnosis not present

## 2022-06-28 DIAGNOSIS — M25552 Pain in left hip: Secondary | ICD-10-CM

## 2022-06-28 DIAGNOSIS — M545 Low back pain, unspecified: Secondary | ICD-10-CM

## 2022-06-28 DIAGNOSIS — G8929 Other chronic pain: Secondary | ICD-10-CM

## 2022-06-28 DIAGNOSIS — M47816 Spondylosis without myelopathy or radiculopathy, lumbar region: Secondary | ICD-10-CM | POA: Diagnosis not present

## 2022-06-28 DIAGNOSIS — M25519 Pain in unspecified shoulder: Secondary | ICD-10-CM

## 2022-06-28 DIAGNOSIS — M75101 Unspecified rotator cuff tear or rupture of right shoulder, not specified as traumatic: Secondary | ICD-10-CM

## 2022-06-28 MED ORDER — METHYLPREDNISOLONE ACETATE 40 MG/ML IJ SUSP
40.0000 mg | Freq: Once | INTRAMUSCULAR | Status: AC
  Administered 2022-06-28: 40 mg via INTRA_ARTICULAR

## 2022-06-28 MED ORDER — MELOXICAM 7.5 MG PO TABS
7.5000 mg | ORAL_TABLET | Freq: Every day | ORAL | 5 refills | Status: DC
Start: 2022-06-28 — End: 2022-11-02

## 2022-06-28 NOTE — Progress Notes (Addendum)
Chief Complaint  Patient presents with   Shoulder Pain   Hip Pain    Rheumatologist send her for bil hip pain she has been getting injections in hips and also pain in right shoulder    72 year old female really with back pain which radiates to both legs and hips  Prior treatment included injections in her hips.  Prior imaging was performed a year or 2 ago  She also complains of pain and stiffness decreased range of motion and weakness of her right shoulder no prior treatment  Takes Naprosyn intermittently  Took steroids for back pain seems to help but only takes it when necessary  Problem list, medical hx, medications and allergies reviewed    BP 131/76   Pulse 65   Ht 5\' 2"  (1.575 m)   Wt 155 lb (70.3 kg)   BMI 28.35 kg/m   Physical Exam Vitals and nursing note reviewed.  Constitutional:      Appearance: Normal appearance.  HENT:     Head: Normocephalic and atraumatic.  Eyes:     General: No scleral icterus.       Right eye: No discharge.        Left eye: No discharge.     Extraocular Movements: Extraocular movements intact.     Conjunctiva/sclera: Conjunctivae normal.     Pupils: Pupils are equal, round, and reactive to light.  Cardiovascular:     Rate and Rhythm: Normal rate.     Pulses: Normal pulses.  Musculoskeletal:     Comments: Back exam  She is tender in the midline of her lower back right at the belt line  She flexes normal he has pain with extension and 20 degrees of extension  Straight leg raises are negative  Hip exam shows normal range of motion the hips in flexion rotation  Normal sensation distally normal reflexes    Skin:    General: Skin is warm and dry.     Capillary Refill: Capillary refill takes less than 2 seconds.  Neurological:     General: No focal deficit present.     Mental Status: She is alert and oriented to person, place, and time.  Psychiatric:        Mood and Affect: Mood normal.        Behavior: Behavior normal.         Thought Content: Thought content normal.        Judgment: Judgment normal.    Encounter Diagnoses  Name Primary?   Hip pain, bilateral    Lumbar pain    Chronic right shoulder pain    Facet arthritis, degenerative, lumbar spine    Rotator cuff syndrome of right shoulder Yes   Plain x-rays today   Lumbar: Show facet arthritis mild scoliosis probably less than 10 degrees officially qualifying for real scoliosis. Hip x-rays were normal  Shoulder x-ray normal  Assessment and plan  Recommend anti-inflammatories for her back pain Meds ordered this encounter  Medications   meloxicam (MOBIC) 7.5 MG tablet    Sig: Take 1 tablet (7.5 mg total) by mouth daily.    Dispense:  30 tablet    Refill:  5   methylPREDNISolone acetate (DEPO-MEDROL) injection 40 mg   Also recommend physical therapy  Recommend subacromial injection right shoulder   Procedure note the subacromial injection shoulder RIGHT    Verbal consent was obtained to inject the  RIGHT   Shoulder  Timeout was completed to confirm the injection site is a subacromial space  of the  RIGHT  shoulder   Medication used Depo-Medrol 40 mg and lidocaine 1% 3 cc  Anesthesia was provided by ethyl chloride  The injection was performed in the RIGHT  posterior subacromial space. After pinning the skin with alcohol and anesthetized the skin with ethyl chloride the subacromial space was injected using a 20-gauge needle. There were no complications  Sterile dressing was applied.   Follow-up as needed

## 2022-07-01 ENCOUNTER — Other Ambulatory Visit: Payer: Self-pay | Admitting: Pharmacist

## 2022-07-01 ENCOUNTER — Other Ambulatory Visit: Payer: Self-pay

## 2022-07-01 ENCOUNTER — Telehealth: Payer: Self-pay | Admitting: Pharmacy Technician

## 2022-07-01 NOTE — Progress Notes (Signed)
Patient will be new start to Prolia. Order placed at [facility]. Routing to [employer] as Lorain Childes. Patient aware.  Dose: 60mg  SQ every 6 months No pre-medications required  Chesley Mires, PharmD, MPH, BCPS, CPP Clinical Pharmacist (Rheumatology and Pulmonology)

## 2022-07-01 NOTE — Telephone Encounter (Signed)
Order for Prolia placed at [facility], PharmD, MPH, BCPS, CPP Clinical Pharmacist (Rheumatology and Pulmonology)

## 2022-07-01 NOTE — Telephone Encounter (Signed)
Call ref#: [id number]  Appeal has been approved from: 06/25/22 - 06/25/23  [facility] Medicare will fax a copy of the approval letter.   Darolyn Rua, PharmD Student [employer]

## 2022-07-01 NOTE — Telephone Encounter (Signed)
Auth Submission: APPROVED Site of care: Site of care: [facility] Payer: UHC MEDICARE Medication & CPT/J Code(s) submitted: Prolia (Denosumab) [id number] Route of submission (phone, fax, portal): PORTAL Phone # Fax # Auth type: Buy/Bill Units/visits requested: X2 Reference number: [id number] Approval from: 06/25/22 to 06/25/23

## 2022-07-05 ENCOUNTER — Encounter (HOSPITAL_COMMUNITY)
Admission: RE | Admit: 2022-07-05 | Discharge: 2022-07-05 | Disposition: A | Discharge status: Home / Self Care | Payer: 59 | Source: Ambulatory Visit | Attending: Physician Assistant | Admitting: Physician Assistant

## 2022-07-05 ENCOUNTER — Ambulatory Visit (INDEPENDENT_AMBULATORY_CARE_PROVIDER_SITE_OTHER): Payer: 59 | Admitting: Gastroenterology

## 2022-07-05 VITALS — BP 143/83 | HR 60 | Temp 98.3°F | Resp 16

## 2022-07-05 DIAGNOSIS — M810 Age-related osteoporosis without current pathological fracture: Secondary | ICD-10-CM | POA: Insufficient documentation

## 2022-07-05 MED ORDER — DENOSUMAB 60 MG/ML ~~LOC~~ SOSY
60.0000 mg | PREFILLED_SYRINGE | Freq: Once | SUBCUTANEOUS | Status: AC
  Administered 2022-07-05: 60 mg via SUBCUTANEOUS

## 2022-07-05 NOTE — Progress Notes (Signed)
Diagnosis: Osteoporosis  Provider:   [name]  Procedure: Injection  Prolia (Denosumab), Dose: 60 mg, Site: subcutaneous, Number of injections: 1  Post Care: Observation period completed  Discharge: Condition: Good, Destination: Home . AVS Provided  Performed by:  Arrie Senate, RN

## 2022-07-19 ENCOUNTER — Ambulatory Visit (INDEPENDENT_AMBULATORY_CARE_PROVIDER_SITE_OTHER): Payer: 59 | Admitting: Gastroenterology

## 2022-07-21 ENCOUNTER — Other Ambulatory Visit: Payer: Self-pay

## 2022-07-28 ENCOUNTER — Telehealth: Payer: Self-pay | Admitting: Orthopedic Surgery

## 2022-07-28 NOTE — Telephone Encounter (Signed)
DR. Romeo Apple   Patient was seen on 06/28/22 and received an injection and it worked for a couple of days and now it is bad again. On a scale of 1 to 10 pain level is 12.  She can't sleep at night.   Dr. Romeo Apple gave her meloxicam (MOBIC) 7.5 MG tablet  and she states that is not working for her.   Amy can you please call her at [phone]  I have scheduled her to see Dr. Romeo Apple on 08/20/22.

## 2022-07-28 NOTE — Telephone Encounter (Signed)
Called her. Left message for her to call back To Dr Romeo Apple  She was on Naprosyn when she was here you changed to Meloxicam\ Can we try another NSAID?   I will advise her to keep follow up appointment  as scheduled

## 2022-08-12 ENCOUNTER — Institutional Professional Consult (permissible substitution): Payer: 59 | Admitting: Plastic Surgery

## 2022-08-20 ENCOUNTER — Encounter: Payer: Self-pay | Admitting: Orthopedic Surgery

## 2022-08-20 ENCOUNTER — Ambulatory Visit: Payer: 59 | Admitting: Orthopedic Surgery

## 2022-08-20 VITALS — BP 161/100 | HR 80 | Ht 62.0 in | Wt 157.0 lb

## 2022-08-20 DIAGNOSIS — M25511 Pain in right shoulder: Secondary | ICD-10-CM

## 2022-08-20 DIAGNOSIS — G8929 Other chronic pain: Secondary | ICD-10-CM

## 2022-08-20 DIAGNOSIS — M75111 Incomplete rotator cuff tear or rupture of right shoulder, not specified as traumatic: Secondary | ICD-10-CM

## 2022-08-20 MED ORDER — INDOMETHACIN 25 MG PO CAPS
25.0000 mg | ORAL_CAPSULE | Freq: Three times a day (TID) | ORAL | 1 refills | Status: DC
Start: 2022-08-20 — End: 2022-11-02

## 2022-08-20 NOTE — Progress Notes (Signed)
Chief Complaint  Patient presents with   Shoulder Pain    Pain in right arm keeps her up at night pain goes to hand and wrist the  meloxicam is not helping she only taking maprosyn      Encounter Diagnoses  Name Primary?   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

## 2022-08-20 NOTE — Patient Instructions (Signed)
Central scheduling [phone] 

## 2022-08-26 ENCOUNTER — Other Ambulatory Visit: Payer: Self-pay

## 2022-08-31 ENCOUNTER — Telehealth: Payer: Self-pay | Admitting: Pharmacy Technician

## 2022-08-31 NOTE — Telephone Encounter (Signed)
Auth Submission: APPROVED Site of care: [facility] of care: [facility] Payer: [employer]HC MEDICARE Medication & CPT/J Code(s) submitted: Prolia (Denosumab) [id number] Route of submission (phone, fax, portal): PORTAL Phone # Fax # Auth type: Buy/Bill PB Units/visits requested: X2 DOSES Reference number: [id number] Approval from: 08/31/22 to 08/31/23

## 2022-09-01 ENCOUNTER — Ambulatory Visit (HOSPITAL_COMMUNITY)
Admission: RE | Admit: 2022-09-01 | Discharge: 2022-09-01 | Disposition: A | Discharge status: Home / Self Care | Payer: 59 | Source: Ambulatory Visit | Attending: Orthopedic Surgery | Admitting: Orthopedic Surgery

## 2022-09-01 DIAGNOSIS — M75111 Incomplete rotator cuff tear or rupture of right shoulder, not specified as traumatic: Secondary | ICD-10-CM | POA: Insufficient documentation

## 2022-09-02 ENCOUNTER — Institutional Professional Consult (permissible substitution): Payer: 59 | Admitting: Plastic Surgery

## 2022-09-10 ENCOUNTER — Ambulatory Visit (INDEPENDENT_AMBULATORY_CARE_PROVIDER_SITE_OTHER): Payer: 59 | Admitting: Orthopedic Surgery

## 2022-09-10 ENCOUNTER — Encounter: Payer: Self-pay | Admitting: Orthopedic Surgery

## 2022-09-10 VITALS — BP 129/78 | HR 88 | Ht 62.0 in | Wt 157.0 lb

## 2022-09-10 DIAGNOSIS — M75111 Incomplete rotator cuff tear or rupture of right shoulder, not specified as traumatic: Secondary | ICD-10-CM

## 2022-09-10 DIAGNOSIS — G8929 Other chronic pain: Secondary | ICD-10-CM

## 2022-09-10 MED ORDER — GABAPENTIN 300 MG PO CAPS
300.0000 mg | ORAL_CAPSULE | Freq: Three times a day (TID) | ORAL | 5 refills | Status: DC
Start: 2022-09-10 — End: 2023-03-28

## 2022-09-10 NOTE — Progress Notes (Signed)
Follow-up visit to discuss MRI results  Chief Complaint  Patient presents with   Shoulder Pain    right   Results    Review MRI     Encounter Diagnoses  Name Primary?   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   History this is a 72 year old female with rheumatoid arthritis on Plaquenil  Who complained of right shoulder pain in June of this year she was put on meloxicam and given a shoulder injection her x-ray was normal  She did not improve complaint of persistent night pain  We sent her for an MRI and she is here for results still complaining of pain in the right shoulder weakness and loss of motion  I have reviewed the MRI  In my opinion she has a nonrepairable rotator cuff tear with poor tissue quality and retraction of up to 2-1/2 cm  The subscapularis and biceps tendon are also torn  I do not think this is a repairable tear and a 72 year old female with rheumatoid arthritis on Plaquenil  I recommend she see Dr. Karlton Lemon to be considered for reverse total shoulder arthroplasty  Meds ordered this encounter  Medications   gabapentin (NEURONTIN) 300 MG capsule    Sig: Take 1 capsule (300 mg total) by mouth 3 (three) times daily.    Dispense:  90 capsule    Refill:  5    She is getting good pain relief at night with gabapentin so we will keep her on that she can continue her Mobic as well

## 2022-09-16 ENCOUNTER — Institutional Professional Consult (permissible substitution): Payer: 59 | Admitting: Plastic Surgery

## 2022-09-17 ENCOUNTER — Ambulatory Visit (INDEPENDENT_AMBULATORY_CARE_PROVIDER_SITE_OTHER): Payer: 59 | Admitting: Orthopedic Surgery

## 2022-09-17 ENCOUNTER — Encounter: Payer: Self-pay | Admitting: Orthopedic Surgery

## 2022-09-17 VITALS — BP 150/99 | HR 64 | Ht 62.0 in | Wt 159.0 lb

## 2022-09-17 DIAGNOSIS — G8929 Other chronic pain: Secondary | ICD-10-CM

## 2022-09-17 DIAGNOSIS — M75121 Complete rotator cuff tear or rupture of right shoulder, not specified as traumatic: Secondary | ICD-10-CM | POA: Diagnosis not present

## 2022-09-17 NOTE — Progress Notes (Signed)
New Patient Visit  Assessment: Tammy Jensen is a 72 y.o. female with the following: 1. Nontraumatic complete tear of right rotator cuff 2. Chronic right shoulder pain  Plan: Tammy Jensen has chronic right shoulder pain.  MRI demonstrates full-thickness tearing of the rotator cuff, with retraction to the level of the glenoid.  She has tried nonoperative management, including activity modification, medications, injections and physical therapy.  She continues to have difficulty, including limited function and persistent pain.  As such, we discussed proceeding with reverse shoulder arthroplasty, as I believe the rotator cuff tear is irreparable.  She states her understanding.  The procedure was discussed in great detail.  All questions have been answered.  Specifically, I have raised concerns about her medications that she is taking for rheumatoid arthritis.  As such, she should be evaluated by rheumatologist, and we need to develop a plan for Plaquenil perioperatively.  I have reached out to her rheumatologist directly, and we will have a plan in place prior to scheduling a date for surgery.  She is interested in proceeding with surgery, and we will schedule a CT scan in preparation.  This will happen at least 1 week prior to a scheduled surgery date.  Once we have medical clearance, as well as a plan for her DMARDs, we will finalize a surgical plan and schedule a date.  Risks and benefits of the surgery, including, but not limited to infection, bleeding, persistent pain, need for further surgery, non-union, malunion and more severe complications associated with anesthesia were discussed with the patient.  The patient has elected to proceed.   Follow-up: Return for After surgery.  Subjective:  Chief Complaint  Patient presents with   Shoulder Pain    R shoulder    History of Present Illness: Tammy Jensen is a 72 y.o. female who presents for evaluation of right shoulder pain.   She has had progressively worsening shoulder pain for the past several months.  She has been evaluated by Dr. Romeo Apple.  He has obtained an MRI.  No specific injury.  She does remember feeling a pop with moving a walker several months ago.  However, nothing other than this 1 incident.  She has tried activity modifications, medications, physical therapy and an injection.  She has difficulty sleeping at night.  She states she does sleep more than 2 hours a night.  She has aching within the right shoulder.  She localizes the pain to the lateral aspect of the right shoulder.   Review of Systems: No fevers or chills No numbness or tingling No chest pain No shortness of breath No bowel or bladder dysfunction No GI distress No headaches   Medical History:  Past Medical History:  Diagnosis Date   Dysrhythmia    High cholesterol    Hypertension    IBS (irritable bowel syndrome)    Rheumatoid arthritis (HCC)     Past Surgical History:  Procedure Laterality Date   BIOPSY  04/14/2022   Procedure: BIOPSY;  Surgeon: Dolores Frame, MD;  [facility];  Service: Gastroenterology;;   COLONOSCOPY N/A 10/07/2016   Procedure: COLONOSCOPY;  Surgeon: Malissa Hippo, MD;  [facility];  Service: Endoscopy;  Laterality: N/A;  730   COLONOSCOPY WITH PROPOFOL N/A 04/14/2022   Procedure: COLONOSCOPY WITH PROPOFOL;  Surgeon: Dolores Frame, MD;  [facility];  Service: Gastroenterology;  Laterality: N/A;  915am, asa 2   POLYPECTOMY  04/14/2022   Procedure: POLYPECTOMY;  Surgeon: Levon Hedger  Alisia Ferrari, MD;  [facility];  Service: Gastroenterology;;   RECONSTRUCTION OF EYELID  09/02/2021   TUBAL LIGATION     VAGINAL HYSTERECTOMY N/A 10/07/2021   Procedure: HYSTERECTOMY VAGINAL;  Surgeon: Lazaro Arms, MD;  Location: [facility];  Service: Gynecology;  Laterality: N/A;    Family History  Problem Relation Age of Onset   Colon cancer Father     Colon cancer Sister    Heart disease Mother    Dementia Mother    Arthritis Mother    Hemachromatosis Son    Hemachromatosis Son    Social History   Tobacco Use   Smoking status: Never    Passive exposure: Never   Smokeless tobacco: Never  Vaping Use   Vaping status: Never Used  Substance Use Topics   Alcohol use: Not Currently    Comment: occasional   Drug use: No    No Known Allergies  Current Meds  Medication Sig   amLODipine (NORVASC) 5 MG tablet Take 5 mg by mouth in the morning.   ARTIFICIAL TEAR SOLUTION OP Place 1 drop into both eyes 3 (three) times daily as needed (dry/irritated eyes.).   aspirin EC 81 MG tablet Take 81 mg by mouth in the morning.   benazepril (LOTENSIN) 40 MG tablet Take 40 mg by mouth in the morning.   Cholecalciferol 50 MCG (2000 UT) TABS Take 2,000 Units by mouth in the morning.   cyanocobalamin (,VITAMIN B-12,) 1000 MCG/ML injection Inject 1,000 mcg into the muscle every 30 (thirty) days.   denosumab (PROLIA) 60 MG/ML SOSY injection as directed Subcutaneous   fexofenadine (ALLEGRA) 60 MG tablet Take 60 mg by mouth daily as needed for allergies or rhinitis.   fluticasone (FLONASE) 50 MCG/ACT nasal spray Place into both nostrils daily as needed for allergies or rhinitis.   furosemide (LASIX) 20 MG tablet Take 20 mg by mouth every other day. In the morning   gabapentin (NEURONTIN) 300 MG capsule Take 1 capsule (300 mg total) by mouth 3 (three) times daily.   hydroxychloroquine (PLAQUENIL) 200 MG tablet Take 1 tablet (200 mg total) by mouth 2 (two) times daily.   indomethacin (INDOCIN) 25 MG capsule Take 1 capsule (25 mg total) by mouth 3 (three) times daily with meals.   meloxicam (MOBIC) 7.5 MG tablet Take 1 tablet (7.5 mg total) by mouth daily.   naproxen (NAPROSYN) 375 MG tablet Take 375 mg by mouth daily as needed (shoulder pain.).   rosuvastatin (CRESTOR) 10 MG tablet Take 10 mg by mouth in the morning.   traZODone (DESYREL) 50 MG tablet  Take 50 mg by mouth at bedtime as needed for sleep.    Objective: BP (!) 150/99   Pulse 64   Ht 5\' 2"  (1.575 m)   Wt 159 lb (72.1 kg)   BMI 29.08 kg/m   Physical Exam:  General: Alert and oriented. and No acute distress. Gait: Normal gait.  Right shoulder without deformity.  Sensation is intact in the axillary nerve distribution.  Forward flexion limited to 90 degrees.  Passively, I can get her beyond 100 degrees, but she has obvious discomfort.  Supple external rotation at her side.  Abduction to 90 degrees.  Sensation is intact in the axillary nerve distribution.  Sensation intact throughout the right hand.  Active motion intact in the right hand.   IMAGING: I personally reviewed images previously obtained in clinic  MRI right shoulder  IMPRESSION: 1. Complete tear of the supraspinatus and infraspinatus  tendons with 3.2 cm of retraction. 2. Moderate tendinosis of the subscapularis tendon with a partial-thickness tear. 3. Complete tear of the proximal long head of the biceps tendon.  New Medications:  No orders of the defined types were placed in this encounter.     Oliver Barre, MD  09/17/2022 1:50 PM

## 2022-09-17 NOTE — Patient Instructions (Signed)
We discussed proceeding with reverse shoulder arthroplasty for your right shoulder today.  The procedure was discussed in detail.  If you have any further questions, please contact the clinic.  In addition, you were provided with pamphlets outlining the procedure, as well as the plan for a CT scan.  Is very important that we obtain medical clearance for you to undergo anesthesia.  My staff will start working on confirming this.  This may require additional office visits for you to see your regular doctor, as well as specialists as needed.   In particular, we will have to discuss your medications for rheumatoid arthritis with your rheumatologist.  I will reach out to them directly.  Please do not stop taking any medications that you usually take for rheumatoid arthritis, unless you have discussed this with the rheumatologist, or my office.   We will place an order for a CT scan, to help with preoperative planning.  This will help [place] to work toward scheduling a date for surgery.    Your surgery will be at [facility], scheduled with Dr Thane Edu   The hospital will contact you with a preoperative appointment to discuss Anesthesia. The phone number is [phone]   Please bring your medications with you for the appointment.  They will tell you the arrival time and medication instructions when you have your preoperative evaluation.  Do not wear nail polish the day of your surgery and if you take Phentermine you need to stop this medication ONE WEEK prior to your surgery.    If you take an blood thinning medication, we will need to stop this prior to surgery.  Typically, we stop this medicine at least 5 days prior to surgery.  We will need to confirm this with the doctor who prescribes this medication.  If you are taking medications or an injection for diabetes, or for weight management, this medicine will need to be stopped at least 7 days prior to surgery.     Surgery will be  scheduled pending authorization by your insurance company.

## 2022-09-24 ENCOUNTER — Ambulatory Visit (HOSPITAL_COMMUNITY)
Admission: RE | Admit: 2022-09-24 | Discharge: 2022-09-24 | Disposition: A | Discharge status: Home / Self Care | Payer: 59 | Source: Ambulatory Visit | Attending: Orthopedic Surgery

## 2022-09-24 DIAGNOSIS — M25511 Pain in right shoulder: Secondary | ICD-10-CM | POA: Insufficient documentation

## 2022-09-24 DIAGNOSIS — G8929 Other chronic pain: Secondary | ICD-10-CM | POA: Diagnosis present

## 2022-09-24 DIAGNOSIS — M75121 Complete rotator cuff tear or rupture of right shoulder, not specified as traumatic: Secondary | ICD-10-CM | POA: Diagnosis present

## 2022-10-06 ENCOUNTER — Telehealth: Payer: Self-pay | Admitting: Orthopedic Surgery

## 2022-10-06 NOTE — Telephone Encounter (Signed)
Dr. Dallas Schimke pt - pt lvm stating she has some questions, that she is supposed to be scheduled for shoulder replacement, [phone]

## 2022-10-07 ENCOUNTER — Telehealth: Payer: Self-pay | Admitting: Orthopedic Surgery

## 2022-10-07 NOTE — Telephone Encounter (Signed)
Dr. Dallas Schimke pt  - pt left another message today stating she is going to be having an upcoming surgery and she has some questions.  She is requesting a call back. [phone]

## 2022-10-08 NOTE — Telephone Encounter (Signed)
Spoke with patient who has questions on when her surgery will be and pain management. Let pt know we are waiting on a clearance letter from her PCP and that Dr. Dallas Schimke usually provides short term titrated narcotics after joint replacement surgery to assist in comfort/healing. Pt verbalized understanding of answers and no further questions at this time.

## 2022-10-19 ENCOUNTER — Telehealth: Payer: Self-pay | Admitting: Orthopedic Surgery

## 2022-10-19 NOTE — Telephone Encounter (Signed)
Dr. Dallas Schimke pt - pt lvm stating that she is scheduled for surgery 11/01/22 and that she was supposed to call if she hasn't gotten any pain medication for after surgery.  She states she hasn't gotten any.

## 2022-10-19 NOTE — Telephone Encounter (Signed)
Called and advised pt provider doesn't send in medications this far from surgery. Let her know all information that she will need for procedure will be discussed during her pre op on 10/21 and a she will receive a printout with information as well. No further questions or concerns at this time.

## 2022-10-22 NOTE — Patient Instructions (Addendum)
Tammy Jensen  10/22/2022     @PREFPERIOPPHARMACY @   Your procedure is scheduled on  11/01/2022.    Report to [facility] at  0600  A.M.   Call this number if you have problems the morning of surgery:  [phone]  If you experience any cold or flu symptoms such as cough, fever, chills, shortness of breath, etc. between now and your scheduled surgery, please notify us at the above number.   Remember:  Do not eat after midnight.   You may drink clear liquids until 0330 am on 11/01/2022.     Clear liquids allowed are:                    Water, Juice (No red color; non-citric and without pulp; diabetics please choose diet or no sugar options), Carbonated beverages (diabetics please choose diet or no sugar options), Clear Tea (No creamer, milk, or cream, including half & half and powdered creamer), Black Coffee Only (No creamer, milk or cream, including half & half and powdered creamer), and Clear Sports drink (No red color; diabetics please choose diet or no sugar options)     Take these medicines the morning of surgery with A SIP OF WATER                           amlodipine, allegra, indocin.     Do not wear jewelry, make-up or nail polish, including gel polish,  artificial nails, or any other type of covering on natural nails (fingers and  toes).  Do not wear lotions, powders, or perfumes, or deodorant.  Do not shave 48 hours prior to surgery.  Men may shave face and neck.  Do not bring valuables to the hospital.  [facility] is not responsible for any belongings or valuables.  Contacts, dentures or bridgework may not be worn into surgery.  Leave your suitcase in the car.  After surgery it may be brought to your room.  For patients admitted to the hospital, discharge time will be determined by your treatment team.  Patients discharged the day of surgery will not be allowed to drive home.    Special instructions:   DO NOT smoke tobacco or vape for 24 hours  before your procedure.  Please read over the following fact sheets that you were given. Pain Booklet, Coughing and Deep Breathing, Total Joint Packet, MRSA Information, Surgical Site Infection Prevention, Anesthesia Post-op Instructions, and Care and Recovery After Surgery        Reverse Total Shoulder Replacement, Care After This sheet gives you information about how to care for yourself after your procedure. Your health care provider may also give you more specific instructions. If you have problems or questions, contact your health care provider. What can I expect after the procedure? After the procedure, it is common to have: Pain in the shoulder and arm. Stiffness in the shoulder and arm. Follow these instructions at home: Medicines Take over-the-counter and prescription medicines only as told by your health care provider. Ask your health care provider if the medicine prescribed to you: Requires you to avoid driving or using machinery. Can cause constipation. You may need to take these actions to prevent or treat constipation: Drink enough fluid to keep your urine pale yellow. Take over-the-counter or prescription medicines. Eat foods that are high in fiber, such as beans, whole grains, and fresh fruits and vegetables. Limit foods that are high  in fat and processed sugars, such as fried or sweet foods. If you have a sling: Wear the sling as told by your health care provider. Remove it only as told by your health care provider. Check the skin around your sling every day. Tell your health care provider about any concerns. Loosen the sling if your fingers tingle, become numb, or turn cold and blue. Keep the sling clean and dry. Bathing Do not take baths, swim, or use a hot tub until your health care provider approves. Ask your health care provider if you may take showers. You may only be allowed to take sponge baths. If the sling is not waterproof: Do not let it get wet. Cover it  with a watertight covering when you take a bath or shower. Keep your bandage (dressing) dry until your health care provider says it can be removed. Incision care  Follow instructions from your health care provider about how to take care of your incision. Make sure you: Wash your hands with soap and water for at least 20 seconds before and after you change your dressing. If soap and water are not available, use hand sanitizer. Change your dressing as told by your health care provider. Leave stitches (sutures), skin glue, or adhesive strips in place. These skin closures may need to stay in place for 2 weeks or longer. If adhesive strip edges start to loosen and curl up, you may trim the loose edges. Do not remove adhesive strips completely unless your health care provider tells you to do that. Check your incision area every day for signs of infection. Check for: More redness, swelling, or pain. Fluid or blood. Warmth. Pus or a bad smell. Managing pain, stiffness, and swelling  If directed, put ice on your shoulder. To do this: If you have a removable sling, remove it as told by your health care provider. Put ice in a plastic bag. Place a towel between your skin and the bag. Leave the ice on for 20 minutes, 2-3 times a day. Remove the ice if your skin turns bright red. This is very important. If you cannot feel pain, heat, or cold, you have a greater risk of damage to the area. Move your fingers and hand often to reduce stiffness and swelling. Driving If you were given a sedative during the procedure, it can affect you for several hours. Do not drive or operate machinery until your health care provider says that it is safe. Ask your health care provider when it is safe to drive if you have a sling on your arm. Activity Return to your normal activities as told by your health care provider. Ask your health care provider what activities are safe for you. Do shoulder exercises as told by your  health care provider. Do not lift your arm above shoulder level until your health care provider approves. Do not make large movements with your arm. Do not push or pull things until your health care provider approves. Do not lift anything that is heavier than 5 lb (2.3 kg) until your health care provider says that it is safe. General instructions Do not use any products that contain nicotine or tobacco, such as cigarettes, e-cigarettes, and chewing tobacco. These can delay healing after surgery. If you need help quitting, ask your health care provider. Tell your health care provider if you plan to have dental work. Also: Tell your dentist about your joint replacement. Ask your health care provider if there are any special instructions you  need to follow before having dental care and routine cleanings. Keep all follow-up visits. This is important. Contact a health care provider if: You feel nauseous or you vomit. Your arm tingles or feels numb. Your pain gets worse, even after taking pain medicine. You have any of these signs of infection: More redness, swelling, or pain around your incision. Fluid or blood coming from your incision. Warmth coming from your incision. Pus or a bad smell coming from your incision. A fever. Get help right away if: Your shoulder joint moves out of place. Your incision comes apart. You have redness, swelling, pain, or warmth in your leg or arm. You have chest pain or shortness of breath. These symptoms may represent a serious problem that is an emergency. Do not wait to see if the symptoms will go away. Get medical help right away. Call your local emergency services (911 in the [place] Do not drive yourself to the hospital. Summary After the procedure, it is common to have some pain and stiffness. Take over-the-counter and prescription medicines only as told by your health care provider. Keep your bandage (dressing) dry until your health care provider says it  can be removed. Know the symptoms that should prompt you to contact your health care provider. Do shoulder exercises as told by your health care provider. Ask your health care provider what activities are safe for you. This information is not intended to replace advice given to you by your health care provider. Make sure you discuss any questions you have with your health care provider. Document Revised: 06/06/2019 Document Reviewed: 06/06/2019 Elsevier Patient Education  2024 [employer] General Anesthesia, Adult, Care After The following information offers guidance on how to care for yourself after your procedure. Your health care provider may also give you more specific instructions. If you have problems or questions, contact your health care provider. What can I expect after the procedure? After the procedure, it is common for people to: Have pain or discomfort at the IV site. Have nausea or vomiting. Have a sore throat or hoarseness. Have trouble concentrating. Feel cold or chills. Feel weak, sleepy, or tired (fatigue). Have soreness and body aches. These can affect parts of the body that were not involved in surgery. Follow these instructions at home: For the time period you were told by your health care provider:  Rest. Do not participate in activities where you could fall or become injured. Do not drive or use machinery. Do not drink alcohol. Do not take sleeping pills or medicines that cause drowsiness. Do not make important decisions or sign legal documents. Do not take care of children on your own. General instructions Drink enough fluid to keep your urine pale yellow. If you have sleep apnea, surgery and certain medicines can increase your risk for breathing problems. Follow instructions from your health care provider about wearing your sleep device: Anytime you are sleeping, including during daytime naps. While taking prescription pain medicines, sleeping medicines, or  medicines that make you drowsy. Return to your normal activities as told by your health care provider. Ask your health care provider what activities are safe for you. Take over-the-counter and prescription medicines only as told by your health care provider. Do not use any products that contain nicotine or tobacco. These products include cigarettes, chewing tobacco, and vaping devices, such as e-cigarettes. These can delay incision healing after surgery. If you need help quitting, ask your health care provider. Contact a health care provider if: You have nausea  or vomiting that does not get better with medicine. You vomit every time you eat or drink. You have pain that does not get better with medicine. You cannot urinate or have bloody urine. You develop a skin rash. You have a fever. Get help right away if: You have trouble breathing. You have chest pain. You vomit blood. These symptoms may be an emergency. Get help right away. Call 911. Do not wait to see if the symptoms will go away. Do not drive yourself to the hospital. Summary After the procedure, it is common to have a sore throat, hoarseness, nausea, vomiting, or to feel weak, sleepy, or fatigue. For the time period you were told by your health care provider, do not drive or use machinery. Get help right away if you have difficulty breathing, have chest pain, or vomit blood. These symptoms may be an emergency. This information is not intended to replace advice given to you by your health care provider. Make sure you discuss any questions you have with your health care provider. Document Revised: 03/20/2021 Document Reviewed: 03/20/2021 Elsevier Patient Education  2024 [employer].

## 2022-10-25 ENCOUNTER — Encounter (HOSPITAL_COMMUNITY)
Admission: RE | Admit: 2022-10-25 | Discharge: 2022-10-25 | Disposition: A | Discharge status: Home / Self Care | Payer: 59 | Source: Ambulatory Visit | Attending: Orthopedic Surgery

## 2022-10-25 ENCOUNTER — Encounter (HOSPITAL_COMMUNITY): Payer: Self-pay

## 2022-10-25 VITALS — BP 131/66 | HR 61 | Temp 98.5°F | Resp 18 | Ht 62.0 in | Wt 157.0 lb

## 2022-10-25 DIAGNOSIS — Z01818 Encounter for other preprocedural examination: Secondary | ICD-10-CM | POA: Insufficient documentation

## 2022-10-25 DIAGNOSIS — I10 Essential (primary) hypertension: Secondary | ICD-10-CM | POA: Insufficient documentation

## 2022-10-25 HISTORY — DX: Tachycardia, unspecified: R00.0

## 2022-10-25 LAB — CBC
HCT: 41.3 % (ref 36.0–46.0)
Hemoglobin: 13.8 g/dL (ref 12.0–15.0)
MCH: 29.1 pg (ref 26.0–34.0)
MCHC: 33.4 g/dL (ref 30.0–36.0)
MCV: 87.1 fL (ref 80.0–100.0)
Platelets: 215 10*3/uL (ref 150–400)
RBC: 4.74 MIL/uL (ref 3.87–5.11)
RDW: 13 % (ref 11.5–15.5)
WBC: 5.9 10*3/uL (ref 4.0–10.5)
nRBC: 0 % (ref 0.0–0.2)

## 2022-10-25 LAB — BASIC METABOLIC PANEL
Anion gap: 7 (ref 5–15)
BUN: 15 mg/dL (ref 8–23)
CO2: 23 mmol/L (ref 22–32)
Calcium: 8.7 mg/dL — ABNORMAL LOW (ref 8.9–10.3)
Chloride: 108 mmol/L (ref 98–111)
Creatinine, Ser: 0.77 mg/dL (ref 0.44–1.00)
GFR, Estimated: >60 mL/min (ref >60)
Glucose, Bld: 102 mg/dL — ABNORMAL HIGH (ref 70–99)
Potassium: 3.5 mmol/L (ref 3.5–5.1)
Sodium: 138 mmol/L (ref 135–145)

## 2022-10-25 LAB — SURGICAL PCR SCREEN
MRSA, PCR: POSITIVE — AB
Staphylococcus aureus: POSITIVE — AB

## 2022-10-26 ENCOUNTER — Ambulatory Visit (INDEPENDENT_AMBULATORY_CARE_PROVIDER_SITE_OTHER): Payer: 59 | Admitting: Gastroenterology

## 2022-10-26 NOTE — Progress Notes (Signed)
Patient is positive for MRSA. Dr Dallas Schimke notified and Mupirocin will be called to her pharmacy.

## 2022-10-27 ENCOUNTER — Other Ambulatory Visit: Payer: Self-pay | Admitting: Orthopedic Surgery

## 2022-10-27 MED ORDER — MUPIROCIN 2 % EX OINT: 1.0000 | TOPICAL_OINTMENT | Freq: Two times a day (BID) | CUTANEOUS | 0 refills | Status: DC

## 2022-10-29 NOTE — Progress Notes (Deleted)
Office Visit Note  Patient: Tammy Jensen             Date of Birth: [birth year]           MRN: [id number]             PCP: Joana Reamer, DO Referring: Joana Reamer, DO Visit Date: 11/12/2022 Occupation: @GUAROCC @  Subjective:  No chief complaint on file.   History of Present Illness: Tammy Jensen is a 72 y.o. female ***   DEXA updated on 01/08/22: BMD as determined from [facility] L1-L2 is 0.777 g/cm2 with a T-Score of -3.2. Previous DEXA 03/19/2019 BMD as determined from [facility] L1-L2 is 0.763 g/cm2 with a T-Scoreof -3.3.  Fosamax May 2021-advised to discontinue fosamax by her PCP.  She took Fosamax for a total of 3 years. Discussed the importance of trying to avoid corticosteroid and oral prednisone use.  The use of either Prolia or Forteo were discussed with the patient at her last appointment on 03/18/2022.  She has decided to proceed with Prolia which has been approved by her insurance.  Activities of Daily Living:  Patient reports morning stiffness for *** {minute/hour:19697}.   Patient {ACTIONS;DENIES/REPORTS:21021675::"Denies"} nocturnal pain.  Difficulty dressing/grooming: {ACTIONS;DENIES/REPORTS:21021675::"Denies"} Difficulty climbing stairs: {ACTIONS;DENIES/REPORTS:21021675::"Denies"} Difficulty getting out of chair: {ACTIONS;DENIES/REPORTS:21021675::"Denies"} Difficulty using hands for taps, buttons, cutlery, and/or writing: {ACTIONS;DENIES/REPORTS:21021675::"Denies"}  No Rheumatology ROS completed.   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   Rheumatoid arthritis with rheumatoid factor of multiple sites without organ or systems involvement (HCC) 09/06/2019   High risk medication use 09/06/2019   Elevated LFTs 09/06/2019    Age-related osteoporosis without current pathological fracture 09/06/2019   Dyslipidemia 08/14/2019   Abnormal electrocardiogram 03/12/2019   Chest pain 03/12/2019   HTN (hypertension) 03/12/2019   Obesity 03/12/2019   Family history of colon cancer 05/28/2016    Past Medical History:  Diagnosis Date   Dysrhythmia    High cholesterol    Hypertension    IBS (irritable bowel syndrome)    Rheumatoid arthritis (HCC)    Tachycardia     Family History  Problem Relation Age of Onset   Colon cancer Father    Colon cancer Sister    Heart disease Mother    Dementia Mother    Arthritis Mother    Hemachromatosis Son    Hemachromatosis Son    Past Surgical History:  Procedure Laterality Date   BIOPSY  04/14/2022   Procedure: BIOPSY;  Surgeon: Dolores Frame, MD;  Location: [facility];  Service: Gastroenterology;;   COLONOSCOPY N/A 10/07/2016   Procedure: COLONOSCOPY;  Surgeon: Malissa Hippo, MD;  Location: [facility];  Service: Endoscopy;  Laterality: N/A;  730   COLONOSCOPY WITH PROPOFOL N/A 04/14/2022   Procedure: COLONOSCOPY WITH PROPOFOL;  Surgeon: Dolores Frame, MD;  Location: [facility];  Service: Gastroenterology;  Laterality: N/A;  915am, asa 2   POLYPECTOMY  04/14/2022   Procedure: POLYPECTOMY;  Surgeon: Dolores Frame, MD;  Location: [facility];  Service: Gastroenterology;;   RECONSTRUCTION OF EYELID  09/02/2021   TUBAL LIGATION     VAGINAL HYSTERECTOMY N/A 10/07/2021   Procedure: HYSTERECTOMY VAGINAL;  Surgeon: Lazaro Arms, MD;  Location: [facility];  Service: Gynecology;  Laterality: N/A;  Social History   Social History Narrative   Not on file   Immunization History  Administered Date(s) Administered   PFIZER(Purple Top)SARS-COV-2 Vaccination 09/20/2019, 10/15/2019     Objective: Vital Signs: There were no vitals taken for this visit.   Physical Exam   Musculoskeletal Exam: ***  CDAI Exam: CDAI Score:  -- Patient Global: --; Provider Global: -- Swollen: --; Tender: -- Joint Exam 11/12/2022   No joint exam has been documented for this visit   There is currently no information documented on the homunculus. Go to the Rheumatology activity and complete the homunculus joint exam.  Investigation: No additional findings.  Imaging: No results found.  Recent Labs: Lab Results  Component Value Date   WBC 5.9 10/25/2022   HGB 13.8 10/25/2022   PLT 215 10/25/2022   NA 138 10/25/2022   K 3.5 10/25/2022   CL 108 10/25/2022   CO2 23 10/25/2022   GLUCOSE 102 (H) 10/25/2022   BUN 15 10/25/2022   CREATININE 0.77 10/25/2022   BILITOT 0.8 06/18/2022   ALKPHOS 57 02/28/2022   AST 23 06/18/2022   ALT 22 06/18/2022   PROT 7.1 06/18/2022   ALBUMIN 4.5 02/28/2022   CALCIUM 8.7 (L) 10/25/2022   GFRAA 89 04/25/2020    Speciality Comments: PLQ Eye Exam: 05/17/2022 WNL  [employer]. Follow up in 1 year.  Procedures:  No procedures performed Allergies: Patient has no known allergies.   Assessment / Plan:     Visit Diagnoses: Rheumatoid arthritis with rheumatoid factor of multiple sites without organ or systems involvement (HCC)  High risk medication use  Chronic left shoulder pain  Lumbar facet arthropathy  Trochanteric bursitis of both hips  Pes cavus  Age-related osteoporosis without current pathological fracture  Vitamin D deficiency  Essential hypertension  Dyslipidemia  History of IBS  Seasonal allergies  Elevated LFTs  Orders: No orders of the defined types were placed in this encounter.  No orders of the defined types were placed in this encounter.   Face-to-face time spent with patient was *** minutes. Greater than 50% of time was spent in counseling and coordination of care.  Follow-Up Instructions: No follow-ups on file.   Gearldine Bienenstock, PA-C  Note - This record has been created using Dragon software.  Chart creation errors have been  sought, but may not always  have been located. Such creation errors do not reflect on  the standard of medical care.

## 2022-11-01 ENCOUNTER — Encounter (HOSPITAL_COMMUNITY): Payer: Self-pay | Admitting: Orthopedic Surgery

## 2022-11-01 ENCOUNTER — Observation Stay (HOSPITAL_COMMUNITY)
Admission: RE | Admit: 2022-11-01 | Discharge: 2022-11-02 | Disposition: A | Discharge status: Home / Self Care | Payer: 59 | Attending: Orthopedic Surgery | Admitting: Orthopedic Surgery

## 2022-11-01 ENCOUNTER — Ambulatory Visit (HOSPITAL_COMMUNITY): Payer: 59

## 2022-11-01 ENCOUNTER — Ambulatory Visit (HOSPITAL_COMMUNITY): Payer: 59 | Admitting: Anesthesiology

## 2022-11-01 ENCOUNTER — Other Ambulatory Visit: Payer: Self-pay

## 2022-11-01 ENCOUNTER — Encounter (HOSPITAL_COMMUNITY)
Admission: RE | Disposition: A | Discharge status: Home / Self Care | Payer: Self-pay | Source: Home / Self Care | Attending: Orthopedic Surgery

## 2022-11-01 DIAGNOSIS — Z79899 Other long term (current) drug therapy: Secondary | ICD-10-CM | POA: Insufficient documentation

## 2022-11-01 DIAGNOSIS — M12811 Other specific arthropathies, not elsewhere classified, right shoulder: Secondary | ICD-10-CM

## 2022-11-01 DIAGNOSIS — I10 Essential (primary) hypertension: Secondary | ICD-10-CM | POA: Insufficient documentation

## 2022-11-01 DIAGNOSIS — Z7982 Long term (current) use of aspirin: Secondary | ICD-10-CM | POA: Diagnosis not present

## 2022-11-01 DIAGNOSIS — Z01818 Encounter for other preprocedural examination: Principal | ICD-10-CM

## 2022-11-01 HISTORY — PX: REVERSE SHOULDER ARTHROPLASTY: SHX5054

## 2022-11-01 LAB — HEMOGLOBIN AND HEMATOCRIT, BLOOD
HCT: 37.9 % (ref 36.0–46.0)
Hemoglobin: 12.6 g/dL (ref 12.0–15.0)

## 2022-11-01 SURGERY — ARTHROPLASTY, SHOULDER, TOTAL, REVERSE
Anesthesia: General | Site: Shoulder | Laterality: Right

## 2022-11-01 MED ORDER — TRAZODONE HCL 50 MG PO TABS
50.0000 mg | ORAL_TABLET | Freq: Every evening | ORAL | Status: DC | PRN
  Administered 2022-11-01: 50 mg via ORAL
  Filled 2022-11-01: qty 1

## 2022-11-01 MED ORDER — ROCURONIUM BROMIDE 100 MG/10ML IV SOLN
INTRAVENOUS | Status: DC | PRN
  Administered 2022-11-01: 20 mg via INTRAVENOUS
  Administered 2022-11-01: 60 mg via INTRAVENOUS

## 2022-11-01 MED ORDER — ORAL CARE MOUTH RINSE: 15.0000 mL | Freq: Once | OROMUCOSAL | Status: AC

## 2022-11-01 MED ORDER — BENAZEPRIL HCL 20 MG PO TABS
40.0000 mg | ORAL_TABLET | Freq: Every day | ORAL | Status: DC
  Administered 2022-11-02: 40 mg via ORAL
  Filled 2022-11-01: qty 2

## 2022-11-01 MED ORDER — MORPHINE SULFATE (PF) 2 MG/ML IV SOLN: 0.5000 mg | INTRAVENOUS | Status: DC | PRN

## 2022-11-01 MED ORDER — CEFAZOLIN SODIUM-DEXTROSE 2-4 GM/100ML-% IV SOLN
2.0000 g | Freq: Three times a day (TID) | INTRAVENOUS | Status: AC
Start: 2022-11-01 — End: 2022-11-02
  Administered 2022-11-01 – 2022-11-02 (×3): 2 g via INTRAVENOUS
  Filled 2022-11-01 (×3): qty 100

## 2022-11-01 MED ORDER — CEFAZOLIN SODIUM-DEXTROSE 2-4 GM/100ML-% IV SOLN
INTRAVENOUS | Status: AC
  Filled 2022-11-01: qty 100

## 2022-11-01 MED ORDER — ONDANSETRON HCL 4 MG/2ML IJ SOLN: 4.0000 mg | Freq: Four times a day (QID) | INTRAMUSCULAR | Status: DC | PRN

## 2022-11-01 MED ORDER — SUGAMMADEX SODIUM 500 MG/5ML IV SOLN
INTRAVENOUS | Status: DC | PRN
  Administered 2022-11-01: 400 mg via INTRAVENOUS

## 2022-11-01 MED ORDER — MUPIROCIN 2 % EX OINT
TOPICAL_OINTMENT | CUTANEOUS | Status: AC
  Filled 2022-11-01: qty 22

## 2022-11-01 MED ORDER — CHLORHEXIDINE GLUCONATE 0.12 % MT SOLN
15.0000 mL | Freq: Once | OROMUCOSAL | Status: AC
Start: 2022-11-01 — End: 2022-11-01
  Administered 2022-11-01: 15 mL via OROMUCOSAL

## 2022-11-01 MED ORDER — ONDANSETRON HCL 4 MG/2ML IJ SOLN
INTRAMUSCULAR | Status: DC | PRN
  Administered 2022-11-01: 4 mg via INTRAVENOUS

## 2022-11-01 MED ORDER — LACTATED RINGERS IV SOLN: INTRAVENOUS | Status: DC

## 2022-11-01 MED ORDER — ROPIVACAINE HCL 5 MG/ML IJ SOLN
INTRAMUSCULAR | Status: AC
  Filled 2022-11-01: qty 30

## 2022-11-01 MED ORDER — EPHEDRINE SULFATE (PRESSORS) 50 MG/ML IJ SOLN
INTRAMUSCULAR | Status: DC | PRN
  Administered 2022-11-01 (×3): 5 mg via INTRAVENOUS

## 2022-11-01 MED ORDER — OXYCODONE HCL 5 MG PO TABS: 5.0000 mg | ORAL_TABLET | Freq: Once | ORAL | Status: DC | PRN

## 2022-11-01 MED ORDER — OXYCODONE HCL 5 MG/5ML PO SOLN: 5.0000 mg | Freq: Once | ORAL | Status: DC | PRN

## 2022-11-01 MED ORDER — AMLODIPINE BESYLATE 5 MG PO TABS
5.0000 mg | ORAL_TABLET | Freq: Every day | ORAL | Status: DC
  Administered 2022-11-02: 5 mg via ORAL
  Filled 2022-11-01: qty 1

## 2022-11-01 MED ORDER — DEXAMETHASONE SODIUM PHOSPHATE 10 MG/ML IJ SOLN
INTRAMUSCULAR | Status: DC | PRN
  Administered 2022-11-01: 10 mg via INTRAVENOUS

## 2022-11-01 MED ORDER — PROPOFOL 10 MG/ML IV BOLUS
INTRAVENOUS | Status: AC
Start: 2022-11-01 — End: ?
  Filled 2022-11-01: qty 20

## 2022-11-01 MED ORDER — FENTANYL CITRATE PF 50 MCG/ML IJ SOSY: 25.0000 ug | PREFILLED_SYRINGE | INTRAMUSCULAR | Status: DC | PRN

## 2022-11-01 MED ORDER — VANCOMYCIN HCL 1000 MG IV SOLR
INTRAVENOUS | Status: AC
Start: 2022-11-01 — End: ?
  Filled 2022-11-01: qty 20

## 2022-11-01 MED ORDER — FENTANYL CITRATE (PF) 100 MCG/2ML IJ SOLN
INTRAMUSCULAR | Status: DC | PRN
  Administered 2022-11-01: 100 ug via INTRAVENOUS

## 2022-11-01 MED ORDER — FENTANYL CITRATE (PF) 100 MCG/2ML IJ SOLN
INTRAMUSCULAR | Status: AC
  Filled 2022-11-01: qty 2

## 2022-11-01 MED ORDER — TRANEXAMIC ACID-NACL 1000-0.7 MG/100ML-% IV SOLN
INTRAVENOUS | Status: AC
  Filled 2022-11-01: qty 100

## 2022-11-01 MED ORDER — PHENYLEPHRINE HCL (PRESSORS) 10 MG/ML IV SOLN
INTRAVENOUS | Status: DC | PRN
  Administered 2022-11-01 (×5): 160 ug via INTRAVENOUS

## 2022-11-01 MED ORDER — ROCURONIUM BROMIDE 10 MG/ML (PF) SYRINGE
PREFILLED_SYRINGE | INTRAVENOUS | Status: AC
Start: 2022-11-01 — End: ?
  Filled 2022-11-01: qty 10

## 2022-11-01 MED ORDER — PROPOFOL 10 MG/ML IV BOLUS
INTRAVENOUS | Status: DC | PRN
  Administered 2022-11-01: 150 mg via INTRAVENOUS

## 2022-11-01 MED ORDER — ONDANSETRON HCL 4 MG/2ML IJ SOLN
INTRAMUSCULAR | Status: AC
  Filled 2022-11-01: qty 2

## 2022-11-01 MED ORDER — GABAPENTIN 300 MG PO CAPS
300.0000 mg | ORAL_CAPSULE | Freq: Three times a day (TID) | ORAL | Status: DC
  Administered 2022-11-01 – 2022-11-02 (×3): 300 mg via ORAL
  Filled 2022-11-01 (×3): qty 1

## 2022-11-01 MED ORDER — FENTANYL CITRATE PF 50 MCG/ML IJ SOSY
PREFILLED_SYRINGE | INTRAMUSCULAR | Status: AC
  Filled 2022-11-01: qty 1

## 2022-11-01 MED ORDER — EPHEDRINE 5 MG/ML INJ
INTRAVENOUS | Status: AC
  Filled 2022-11-01: qty 10

## 2022-11-01 MED ORDER — OXYCODONE HCL 5 MG PO TABS: 10.0000 mg | ORAL_TABLET | ORAL | Status: DC | PRN

## 2022-11-01 MED ORDER — SEVOFLURANE IN SOLN
RESPIRATORY_TRACT | Status: AC
Start: 2022-11-01 — End: ?
  Filled 2022-11-01: qty 500

## 2022-11-01 MED ORDER — FLUTICASONE PROPIONATE 50 MCG/ACT NA SUSP: 1.0000 | Freq: Every day | NASAL | Status: DC | PRN

## 2022-11-01 MED ORDER — PHENYLEPHRINE HCL-NACL 20-0.9 MG/250ML-% IV SOLN
INTRAVENOUS | Status: DC | PRN
  Administered 2022-11-01: 50 ug/min via INTRAVENOUS

## 2022-11-01 MED ORDER — PHENYLEPHRINE 80 MCG/ML (10ML) SYRINGE FOR IV PUSH (FOR BLOOD PRESSURE SUPPORT)
PREFILLED_SYRINGE | INTRAVENOUS | Status: AC
  Filled 2022-11-01: qty 20

## 2022-11-01 MED ORDER — DEXAMETHASONE SODIUM PHOSPHATE 10 MG/ML IJ SOLN
INTRAMUSCULAR | Status: AC
  Filled 2022-11-01: qty 1

## 2022-11-01 MED ORDER — MIDAZOLAM HCL 5 MG/5ML IJ SOLN
INTRAMUSCULAR | Status: DC | PRN
  Administered 2022-11-01: 2 mg via INTRAVENOUS

## 2022-11-01 MED ORDER — ONDANSETRON HCL 4 MG PO TABS: 4.0000 mg | ORAL_TABLET | Freq: Four times a day (QID) | ORAL | Status: DC | PRN

## 2022-11-01 MED ORDER — MIDAZOLAM HCL 2 MG/2ML IJ SOLN
INTRAMUSCULAR | Status: AC
  Filled 2022-11-01: qty 2

## 2022-11-01 MED ORDER — ONDANSETRON HCL 4 MG/2ML IJ SOLN: 4.0000 mg | Freq: Once | INTRAMUSCULAR | Status: DC | PRN

## 2022-11-01 MED ORDER — STERILE WATER FOR IRRIGATION IR SOLN
Status: DC | PRN
  Administered 2022-11-01: 3000 mL
  Administered 2022-11-01: 1000 mL

## 2022-11-01 MED ORDER — CEFAZOLIN SODIUM-DEXTROSE 2-4 GM/100ML-% IV SOLN
2.0000 g | INTRAVENOUS | Status: AC
  Administered 2022-11-01: 2 g via INTRAVENOUS

## 2022-11-01 MED ORDER — TRANEXAMIC ACID-NACL 1000-0.7 MG/100ML-% IV SOLN
1000.0000 mg | INTRAVENOUS | Status: AC
  Administered 2022-11-01: 1000 mg via INTRAVENOUS

## 2022-11-01 MED ORDER — LORATADINE 10 MG PO TABS
10.0000 mg | ORAL_TABLET | Freq: Every day | ORAL | Status: DC
  Administered 2022-11-02: 10 mg via ORAL
  Filled 2022-11-01: qty 1

## 2022-11-01 MED ORDER — ACETAMINOPHEN 500 MG PO TABS
1000.0000 mg | ORAL_TABLET | Freq: Three times a day (TID) | ORAL | Status: DC
  Administered 2022-11-01 – 2022-11-02 (×3): 1000 mg via ORAL
  Filled 2022-11-01 (×3): qty 2

## 2022-11-01 MED ORDER — ROSUVASTATIN CALCIUM 10 MG PO TABS
10.0000 mg | ORAL_TABLET | Freq: Every day | ORAL | Status: DC
  Administered 2022-11-01 – 2022-11-02 (×2): 10 mg via ORAL
  Filled 2022-11-01 (×2): qty 1

## 2022-11-01 MED ORDER — OXYCODONE HCL 5 MG PO TABS
5.0000 mg | ORAL_TABLET | ORAL | Status: DC | PRN
  Administered 2022-11-02: 5 mg via ORAL
  Filled 2022-11-01: qty 1

## 2022-11-01 MED ORDER — ASPIRIN 81 MG PO CHEW
81.0000 mg | CHEWABLE_TABLET | Freq: Two times a day (BID) | ORAL | Status: DC
Start: 2022-11-02 — End: 2022-11-02
  Administered 2022-11-02: 81 mg via ORAL
  Filled 2022-11-01: qty 1

## 2022-11-01 MED ORDER — SODIUM CHLORIDE 0.9 % IR SOLN
Status: DC | PRN
  Administered 2022-11-01: 1000 mL

## 2022-11-01 MED ORDER — VANCOMYCIN HCL 1000 MG IV SOLR
INTRAVENOUS | Status: DC | PRN
  Administered 2022-11-01: 1000 mg via TOPICAL

## 2022-11-01 SURGICAL SUPPLY — 90 items
APL PRP STRL LF DISP 70% ISPRP (MISCELLANEOUS) ×2
BIT DRILL FLUTED 3.0 STRL (BIT) IMPLANT
BLADE SAW SGTL 83.5X18.5 (BLADE) ×1 IMPLANT
BLADE SURG SZ10 CARB STEEL (BLADE) ×2 IMPLANT
BNDG GAUZE ELAST 4 BULKY (GAUZE/BANDAGES/DRESSINGS) ×1 IMPLANT
BSPLAT GLND +2X24 MDLR (Joint) ×1 IMPLANT
CALIBRATOR GLENOID VIP 5-D (SYSTAGENIX WOUND MANAGEMENT) IMPLANT
CHLORAPREP W/TINT 26 (MISCELLANEOUS) ×2 IMPLANT
CLOTH BEACON ORANGE TIMEOUT ST (SAFETY) ×1 IMPLANT
COMP HUM CUP REV SHLD 33 +2 RT (Shoulder) ×1 IMPLANT
COMPONENT HUM CUP SHLD33+2RT (Shoulder) IMPLANT
COOLER ICEMAN CLASSIC (MISCELLANEOUS) ×1 IMPLANT
COUNTER NDL 20CT MAGNET RED (NEEDLE) ×1 IMPLANT
COUNTER NDL MAGNETIC 40 RED (SET/KITS/TRAYS/PACK) IMPLANT
COUNTER NEEDLE MAGNETIC 40 RED (SET/KITS/TRAYS/PACK) ×1
COVER LIGHT HANDLE STERIS (MISCELLANEOUS) ×2 IMPLANT
DECANTER SPIKE VIAL GLASS SM (MISCELLANEOUS) IMPLANT
DRAPE HALF SHEET 40X57 (DRAPES) ×1 IMPLANT
DRAPE INCISE IOBAN 44X35 STRL (DRAPES) ×1 IMPLANT
DRAPE SHOULDER BEACH CHAIR (DRAPES) ×1 IMPLANT
DRAPE U-SHAPE 47X51 STRL (DRAPES) ×1 IMPLANT
DRESSING AQUACEL AG ADV 3.5X12 (MISCELLANEOUS) ×1 IMPLANT
DRSG AQUACEL AG ADV 3.5X10 (GAUZE/BANDAGES/DRESSINGS) IMPLANT
DRSG AQUACEL AG ADV 3.5X12 (MISCELLANEOUS) ×1
ELECT BLADE 6 FLAT ULTRCLN (ELECTRODE) IMPLANT
ELECT REM PT RETURN 9FT ADLT (ELECTROSURGICAL) ×1
ELECTRODE REM PT RTRN 9FT ADLT (ELECTROSURGICAL) ×1 IMPLANT
GLENOID UNI REV MOD 24 +2 LAT (Joint) IMPLANT
GLENOSPHERE 33+4 LAT/24 UNI RV (Joint) IMPLANT
GLOVE BIO SURGEON STRL SZ8 (GLOVE) ×3 IMPLANT
GLOVE BIOGEL PI IND STRL 7.0 (GLOVE) ×2 IMPLANT
GLOVE BIOGEL PI IND STRL 8 (GLOVE) ×1 IMPLANT
GOWN STRL REUS W/TWL LRG LVL3 (GOWN DISPOSABLE) ×3 IMPLANT
HANDPIECE INTERPULSE COAX TIP (DISPOSABLE)
HOOD W/PEELAWAY (MISCELLANEOUS) ×3 IMPLANT
IV NS IRRIG 3000ML ARTHROMATIC (IV SOLUTION) ×1 IMPLANT
KIT POSITION SHOULDER SCHLEI (MISCELLANEOUS) ×1 IMPLANT
KIT SET UNIVERSAL (KITS) IMPLANT
KIT STABILIZATION SHOULDER (MISCELLANEOUS) ×1 IMPLANT
KIT TURNOVER KIT A (KITS) ×1 IMPLANT
LINER HUM CONST SHLD 33 +3 (Liner) IMPLANT
LINER HUM CONSTR SHLD 33 +3 (Liner) ×1 IMPLANT
MANIFOLD NEPTUNE II (INSTRUMENTS) ×1 IMPLANT
MARKER SKIN DUAL TIP RULER LAB (MISCELLANEOUS) ×1 IMPLANT
NDL HYPO 18GX1.5 BLUNT FILL (NEEDLE) IMPLANT
NDL HYPO 21X1.5 SAFETY (NEEDLE) IMPLANT
NDL MA TROC 1/2 (NEEDLE) IMPLANT
NDL TAPERED W/ NITINOL LOOP (MISCELLANEOUS) IMPLANT
NEEDLE HYPO 18GX1.5 BLUNT FILL (NEEDLE)
NEEDLE HYPO 21X1.5 SAFETY (NEEDLE)
NEEDLE MA TROC 1/2 (NEEDLE)
NEEDLE TAPERED W/ NITINOL LOOP (MISCELLANEOUS) ×1
NS IRRIG 1000ML POUR BTL (IV SOLUTION) ×1 IMPLANT
PACK BASIC III (CUSTOM PROCEDURE TRAY) ×1
PACK SRG BSC III STRL LF ECLPS (CUSTOM PROCEDURE TRAY) ×1 IMPLANT
PACK TOTAL JOINT (CUSTOM PROCEDURE TRAY) ×1 IMPLANT
PAD ABD 5X9 TENDERSORB (GAUZE/BANDAGES/DRESSINGS) ×4 IMPLANT
PAD COLD SHLDR WRAP-ON (PAD) ×1 IMPLANT
PASSER SUT CAPTURE FIRST (INSTRUMENTS) IMPLANT
PIN NITINOL TARGETER 2.8 (PIN) IMPLANT
POSITIONER HEAD 8X9X4 ADT (SOFTGOODS) ×1 IMPLANT
SCREW CENTRAL MODULAR 25 (Screw) IMPLANT
SCREW PERI LOCK 5.5X16 (Screw) IMPLANT
SCREW PERI LOCK 5.5X32 (Screw) IMPLANT
SCREW PERIPHERAL NL 4.5X28 (Screw) IMPLANT
SET BASIN LINEN APH (SET/KITS/TRAYS/PACK) ×1 IMPLANT
SET HNDPC FAN SPRY TIP SCT (DISPOSABLE) IMPLANT
SLING ARM ULTRA III XL (SLING) IMPLANT
SLING ULTRA II L (ORTHOPEDIC SUPPLIES) IMPLANT
SLING ULTRA III MED (ORTHOPEDIC SUPPLIES) IMPLANT
SPONGE T-LAP 18X18 ~~LOC~~+RFID (SPONGE) IMPLANT
STEM HUMERAL UNI REVERSE SZ10 (Stem) IMPLANT
STRIP CLOSURE SKIN 1/2X4 (GAUZE/BANDAGES/DRESSINGS) ×2 IMPLANT
SUT MNCRL AB 4-0 PS2 18 (SUTURE) ×1 IMPLANT
SUT MON AB 2-0 CT1 36 (SUTURE) ×1 IMPLANT
SUT VIC AB 0 CT1 27 (SUTURE) ×1
SUT VIC AB 0 CT1 27XBRD ANTBC (SUTURE) IMPLANT
SUT VIC AB 1 CT1 27 (SUTURE) ×1
SUT VIC AB 1 CT1 27XBRD ANTBC (SUTURE) ×1 IMPLANT
SUTURE TAPE 1.3 40 TPR END (SUTURE) IMPLANT
SUTURETAPE 1.3 40 TPR END (SUTURE) ×1
SUTURETAPE 1.3 40 W/NDL BLK/WH (SUTURE) ×2 IMPLANT
SYR 30ML LL (SYRINGE) IMPLANT
SYR BULB IRRIG 60ML STRL (SYRINGE) ×1 IMPLANT
TOWEL OR 17X26 4PK STRL BLUE (TOWEL DISPOSABLE) ×1 IMPLANT
TOWER CARTRIDGE SMART MIX (DISPOSABLE) IMPLANT
TRAY FOLEY W/BAG SLVR 16FR (SET/KITS/TRAYS/PACK) ×1
TRAY FOLEY W/BAG SLVR 16FR ST (SET/KITS/TRAYS/PACK) ×1 IMPLANT
WATER STERILE IRR 1000ML POUR (IV SOLUTION) ×1 IMPLANT
YANKAUER SUCT 12FT TUBE ARGYLE (SUCTIONS) ×1 IMPLANT

## 2022-11-01 NOTE — H&P (Signed)
Below is the most recent clinic note for Tammy Jensen; any pertinent information regarding their recent medical history will be updated on the day of surgery.    New Patient Visit  Assessment: Tammy Jensen is a 72 y.o. female with the following: 1. Nontraumatic complete tear of right rotator cuff 2. Chronic right shoulder pain  Plan: Tammy Jensen has chronic right shoulder pain.  MRI demonstrates full-thickness tearing of the rotator cuff, with retraction to the level of the glenoid.  She has tried nonoperative management, including activity modification, medications, injections and physical therapy.  She continues to have difficulty, including limited function and persistent pain.  As such, we discussed proceeding with reverse shoulder arthroplasty, as I believe the rotator cuff tear is irreparable.  She states her understanding.  The procedure was discussed in great detail.  All questions have been answered.  Specifically, I have raised concerns about her medications that she is taking for rheumatoid arthritis.  As such, she should be evaluated by rheumatologist, and we need to develop a plan for Plaquenil perioperatively.  I have reached out to her rheumatologist directly, and we will have a plan in place prior to scheduling a date for surgery.  She is interested in proceeding with surgery, and we will schedule a CT scan in preparation.  This will happen at least 1 week prior to a scheduled surgery date.  Once we have medical clearance, as well as a plan for her DMARDs, we will finalize a surgical plan and schedule a date.  Risks and benefits of the surgery, including, but not limited to infection, bleeding, persistent pain, need for further surgery, non-union, malunion and more severe complications associated with anesthesia were discussed with the patient.  The patient has elected to proceed.   Follow-up: No follow-ups on file.  Subjective:  No chief complaint on  file.   History of Present Illness: Tammy Jensen is a 72 y.o. female who presents for evaluation of right shoulder pain.  She has had progressively worsening shoulder pain for the past several months.  She has been evaluated by Dr. Romeo Apple.  He has obtained an MRI.  No specific injury.  She does remember feeling a pop with moving a walker several months ago.  However, nothing other than this 1 incident.  She has tried activity modifications, medications, physical therapy and an injection.  She has difficulty sleeping at night.  She states she does sleep more than 2 hours a night.  She has aching within the right shoulder.  She localizes the pain to the lateral aspect of the right shoulder.   Review of Systems: No fevers or chills No numbness or tingling No chest pain No shortness of breath No bowel or bladder dysfunction No GI distress No headaches   Medical History:  Past Medical History:  Diagnosis Date   Dysrhythmia    High cholesterol    Hypertension    IBS (irritable bowel syndrome)    Rheumatoid arthritis (HCC)    Tachycardia     Past Surgical History:  Procedure Laterality Date   BIOPSY  04/14/2022   Procedure: BIOPSY;  Surgeon: Dolores Frame, MD;  Location: [facility];  Service: Gastroenterology;;   COLONOSCOPY N/A 10/07/2016   Procedure: COLONOSCOPY;  Surgeon: Malissa Hippo, MD;  Location: [facility];  Service: Endoscopy;  Laterality: N/A;  730   COLONOSCOPY WITH PROPOFOL N/A 04/14/2022   Procedure: COLONOSCOPY WITH PROPOFOL;  Surgeon: Dolores Frame, MD;  Location: [facility];  Service: Gastroenterology;  Laterality: N/A;  915am, asa 2   POLYPECTOMY  04/14/2022   Procedure: POLYPECTOMY;  Surgeon: Dolores Frame, MD;  Location: [facility];  Service: Gastroenterology;;   RECONSTRUCTION OF EYELID  09/02/2021   TUBAL LIGATION     VAGINAL HYSTERECTOMY N/A 10/07/2021   Procedure: HYSTERECTOMY VAGINAL;  Surgeon: Lazaro Arms, MD;  Location: [facility];  Service: Gynecology;  Laterality: N/A;    Family History  Problem Relation Age of Onset   Colon cancer Father    Colon cancer Sister    Heart disease Mother    Dementia Mother    Arthritis Mother    Hemachromatosis Son    Hemachromatosis Son    Social History   Tobacco Use   Smoking status: Never    Passive exposure: Never   Smokeless tobacco: Never  Vaping Use   Vaping status: Never Used  Substance Use Topics   Alcohol use: Not Currently    Comment: occasional   Drug use: No    No Known Allergies  Current Meds  Medication Sig   amLODipine (NORVASC) 5 MG tablet Take 5 mg by mouth in the morning.   ARTIFICIAL TEAR SOLUTION OP Place 1 drop into both eyes 3 (three) times daily as needed (dry/irritated eyes.).   aspirin EC 81 MG tablet Take 81 mg by mouth in the morning.   benazepril (LOTENSIN) 40 MG tablet Take 40 mg by mouth in the morning.   Cholecalciferol 50 MCG (2000 UT) TABS Take 2,000 Units by mouth in the morning.   cyanocobalamin (,VITAMIN B-12,) 1000 MCG/ML injection Inject 1,000 mcg into the muscle every 30 (thirty) days.   denosumab (PROLIA) 60 MG/ML SOSY injection Inject 60 mg into the skin every 6 (six) months.   fexofenadine (ALLEGRA) 60 MG tablet Take 60 mg by mouth daily as needed for allergies or rhinitis.   fluticasone (FLONASE) 50 MCG/ACT nasal spray Place 1-2 sprays into both nostrils daily as needed for allergies or rhinitis.   furosemide (LASIX) 20 MG tablet Take 20 mg by mouth every other day.   gabapentin (NEURONTIN) 300 MG capsule Take 1 capsule (300 mg total) by mouth 3 (three) times daily.   hydroxychloroquine (PLAQUENIL) 200 MG tablet Take 1 tablet (200 mg total) by mouth 2 (two) times daily.   indomethacin (INDOCIN) 25 MG capsule Take 1 capsule (25 mg total) by mouth 3 (three) times daily with meals. (Patient taking differently: Take 25 mg by mouth 3 (three) times daily as needed for moderate pain (pain score  4-6).)   naproxen (NAPROSYN) 375 MG tablet Take 375 mg by mouth daily as needed (shoulder pain.).   rosuvastatin (CRESTOR) 10 MG tablet Take 10 mg by mouth in the morning.   traZODone (DESYREL) 50 MG tablet Take 50 mg by mouth at bedtime as needed for sleep.    Objective: BP 126/65   Pulse 81   Temp (!) 97.4 F (36.3 C) (Oral)   Resp 16   SpO2 100%   Physical Exam:  General: Alert and oriented. and No acute distress. Gait: Normal gait.  Right shoulder without deformity.  Sensation is intact in the axillary nerve distribution.  Forward flexion limited to 90 degrees.  Passively, I can get her beyond 100 degrees, but she has obvious discomfort.  Supple external rotation at her side.  Abduction to 90 degrees.  Sensation is intact in the axillary nerve distribution.  Sensation intact throughout the right hand.  Active motion intact in  the right hand.   IMAGING: I personally reviewed images previously obtained in clinic  MRI right shoulder  IMPRESSION: 1. Complete tear of the supraspinatus and infraspinatus tendons with 3.2 cm of retraction. 2. Moderate tendinosis of the subscapularis tendon with a partial-thickness tear. 3. Complete tear of the proximal long head of the biceps tendon.   Oliver Barre, MD  11/01/2022 7:24 AM

## 2022-11-01 NOTE — Anesthesia Procedure Notes (Signed)
Procedure Name: Intubation Date/Time: 11/01/2022 7:47 AM  Performed by: Shanon Payor, CRNAPre-anesthesia Checklist: Patient identified, Emergency Drugs available, Suction available, Patient being monitored and Timeout performed Patient Re-evaluated:Patient Re-evaluated prior to induction Oxygen Delivery Method: Circle system utilized Preoxygenation: Pre-oxygenation with 100% oxygen Induction Type: IV induction Ventilation: Mask ventilation without difficulty Laryngoscope Size: Mac and 3 Grade View: Grade I Tube type: Oral Tube size: 7.0 mm Number of attempts: 1 Airway Equipment and Method: Stylet Placement Confirmation: ETT inserted through vocal cords under direct vision, positive ETCO2, CO2 detector and breath sounds checked- equal and bilateral Secured at: 22 cm Tube secured with: Tape Dental Injury: Teeth and Oropharynx as per pre-operative assessment

## 2022-11-01 NOTE — Progress Notes (Signed)
1115--- unit 300 staffing capped and cannot take patient at this time

## 2022-11-01 NOTE — Op Note (Signed)
Orthopaedic Surgery Operative Note (CSN: [id number])  [name]  05-Aug-1950 Date of Surgery: 11/01/2022   Diagnoses:  RIGHT ROTATOR CUFF ARTHROPATHY  Procedure: Right reverse shoulder arthroplasty   Operative Finding Successful completion of the planned procedure.    Post-Op Diagnosis: Same Surgeons:Primary: Oliver Barre, MD Assistants:  Cecile Sheerer Location: [facility] ROOM 4 Anesthesia: General with regional anesthesia Antibiotics: Ancef 2 g with local vancomycin powder 1 g at the surgical site Tourniquet time: N/A Estimated Blood Loss: 250 cc Complications: None Specimens: None  Implants: Implant Name Type Inv. Item Serial No. Manufacturer Lot No. LRB No. Used Action  BSPLAT GLND +2X24 MDLR - [id number] Joint BSPLAT GLND +2X24 MDLR  [employer] [id number] Right 1 Implanted  SCREW CENTRAL MODULAR 25 - [id number] Screw SCREW CENTRAL MODULAR 25  [employer] [id number] Right 1 Implanted  SCREW PERIPHERAL NL 4.5X28 - [id number] Screw SCREW PERIPHERAL NL 4.5X28  [employer] [id number] Right 1 Implanted  SCREW PERI LOCK 5.5X32 - [id number] Screw SCREW PERI LOCK 5.5X32  [employer] [id number] Right 1 Implanted  SCREW PERI LOCK 5.5X16 - [id number] Screw SCREW PERI LOCK 5.5X16  [employer] [id number] Right 1 Implanted  GLENOSPHERE 33+4 LAT/24 UNI RV - [id number] Joint GLENOSPHERE 33+4 LAT/24 UNI RV  [employer] [id number] Right 1 Implanted  STEM HUMERAL UNI REVERSE SZ10 - [id number] Stem STEM HUMERAL UNI REVERSE SZ10  [employer] [id number] Right 1 Implanted  COMP HUM CUP REV SHLD 33 +2 RT - [id number] Shoulder COMP HUM CUP REV SHLD 33 +2 RT  [employer] [id number] Right 1 Implanted  LINER HUM CONSTR SHLD 33 +3 - [id number] Liner LINER HUM CONSTR SHLD 33 +3  [employer] [id number] Right 1 Implanted    Indications for Surgery:   Tammy Jensen is a 72 y.o. female with right shoulder pain, no specific injury.  MRI demonstrated a large irreparable rotator cuff tear.  Her pain was refractory to  activity modifications, medications, injections as well as appropriate exercises.  As such, I am recommending surgery in the form of a reverse shoulder arthroplasty.  Benefits and risks of operative and nonoperative management were discussed prior to surgery with the patient and informed consent form was completed.  Specific risks including, but not limited to, infection, need for additional surgery, bleeding, persistent pain, non-union, implant loosening, malunion, persistent pain, stiffness, dislocation and more severe complications associated with anesthesia were discussed with the patient.  The patient has elected to proceed.  She is currently taking hydroxychloroquine, and this was discussed with the rheumatologist.  We did not feel the need to stop hydroxychloroquine prior to surgery.  Nonetheless, on her own, she elected to stop taking it for approximately 2 weeks.  Will resume taking his medication immediately following surgery.  Surgical consent has been finalized.  Procedure:   The patient was identified properly. Informed consent was obtained and the surgical site was marked. The patient was taken up to operating room where general anesthesia was induced.  The patient was positioned in beach chair position.  The right shoulder was prepped and draped in the usual sterile fashion.  Timeout was performed before the beginning of the case.  The patient received appropriate antibiotics prior to making incision.  In addition, the patient received 1 g TXA prior to starting.  This was confirmed during the preincision timeout.  We made an incision for the standard deltopectoral approach was performed with a #10 blade. We dissected down through the subcutaneous tissues and the cephalic vein was taken  laterally with the deltoid. The clavipectoral fascia was incised in line with the incision. Deep retractors were placed. The long head of the biceps tendon was previously injured, not identified in our surgical  field.  The bicipital groove was identified, and used this as a landmark to initiate our dissection both proximally into the glenohumeral joint, as well as distally as part of the subscapularis peel.  The subscapularis was taken down in a full thickness layer with capsule along the humeral neck extending inferiorly around the humeral head. We continued releasing the capsule directly off of the osteophytes inferiorly all the way around the corner. This allowed [place] to dislocate the humeral head. Multiple tag stitches were placed in the subscapularis tendon to maintain control during the release of the tendon, and for the remainder of the case.    The rotator cuff was carefully examined and noted to be irreperably torn.  The decision was confirmed that a reverse total shoulder was indicated for this patient.  There were osteophytes along the inferior humeral neck. The osteophytes were removed using an osteotome and a rongeur.  At this point, the anatomic neck was well visualized.     A humeral cutting guide with a version rod was secured to the anterior aspect of the humeral head. The version was set at 20 of retroversion. A humeral osteotomy was performed with an oscillating saw. The head fragment was passed off to the back table, and used to estimate the humeral head size for our implant. A cut protector was placed.  The humerus was retracted posteriorly and we turned our attention to glenoid exposure.  The subscapularis was again identified and we took care to palpate the axillary nerve anteriorly and verify its position with gentle palpation as well as the tug test.  We then released the SGHL with bovie cautery prior to placing a curved mayo at the junction of the anterior glenoid well above the axillary nerve and bluntly dissecting the subscapularis from the capsule.  We then carefully protected the axillary nerve as we gently released the inferior capsule to fully mobilize the subscapularis.  An  anterior deltoid retractor was then placed as well as a small Hohmann retractor superiorly.   The glenoid was inspected, and cartilage remained intact.  There was a hypertrophic labrum, without specific osteoarthritic wear within the glenoid.  The remaining labrum was removed circumferentially taking great care not to disrupt the posterior capsule.   Based on the preoperative templating, the glenoid drill guide was placed and used to drill a guide pin in the center, inferior position. The glenoid face was then reamed concentrically over the guide wire. The center hole was drilled over the guidepin in a near anatomic angle of version. Next the glenoid vault was drilled back to a depth of 25 mm.  We tapped and then placed a 24 mm size baseplate with 2 mm of lateralization was selected with a 25 mm length central screw.  The base plate was screwed into the glenoid vault obtaining secure fixation. We next placed an inferior nonlocking screw, followed by superior and anterior locking screws for additional fixation.  Next a 33 mm glenosphere was selected and impacted onto the baseplate. The center screw was tightened.    We turned attention back to the humeral side. The cut protector was removed. A starter awl was used to open the humeral canal. We next used T-handle straight reamers to ream up to an appropriate fit. We then broached starting with a  size 5 broach and broaching up to 10 which obtained an appropriate fit. The broach handle was removed. We trialed with multiple size tray and polyethylene options and selected a 33 + 3 mm constrained liner which provided good stability and range of motion without excess soft tissue tension.  We used a posterior offset reamer to match the normal anatomy.  The shoulder was trialed.  There was good ROM in all planes and the shoulder was stable with no inferior translation.  The real humeral implants were opened after again confirming sizes.  The trial was removed. #5  Fiberwire x 2 sutures passed through the eyelets on the stem for subscap repair. The humeral component was press-fit obtaining a secure fit. A posterior offset tray was selected and impacted onto the stem.  A 33+3 polyethylene constrained liner was impacted onto the stem.  The joint was reduced and thoroughly irrigated with pulsatile lavage.  1 g of bank powder was used.  The subscapularis was repaired back with #2 Fiberwire sutures through the eyelets on the stem. Hemostasis was obtained. The deltopectoral interval was reapproximated with #1 Vicryl. The subcutaneous tissues were closed with 2-0 monocryl and the skin was closed with running monocryl.    The wounds were cleaned and dried and an Aquacel dressing was placed. The drapes taken down. The arm was placed into sling with abduction pillow. Patient was awakened, extubated, and transferred to the recovery room in stable condition. There were no intraoperative complications. The sponge, needle, and attention counts were  correct at the end of the case.   Post-operative plan:  The patient will be admitted for over night observation.   We have placed a referral for PT to begin 1-2 weeks postop, prior to the first postoperative clinic visit.  DVT prophylaxis Aspirin 81 mg twice daily for 6 weeks.    Pain control with PRN pain medication preferring oral medicines.   Follow up plan will be scheduled in approximately 10-14 days for incision check and XR.

## 2022-11-01 NOTE — Anesthesia Preprocedure Evaluation (Signed)
Anesthesia Evaluation  Patient identified by MRN, date of birth, ID band Patient awake    Reviewed: Allergy & Precautions, H&P , NPO status , Patient's Chart, lab work & pertinent test results, reviewed documented beta blocker date and time   Airway Mallampati: II  TM Distance: >3 FB Neck ROM: full    Dental no notable dental hx.    Pulmonary neg pulmonary ROS   Pulmonary exam normal breath sounds clear to auscultation       Cardiovascular Exercise Tolerance: Good hypertension, negative cardio ROS + dysrhythmias  Rhythm:regular Rate:Normal     Neuro/Psych negative neurological ROS  negative psych ROS   GI/Hepatic negative GI ROS, Neg liver ROS,,,  Endo/Other  negative endocrine ROS    Renal/GU negative Renal ROS  negative genitourinary   Musculoskeletal   Abdominal   Peds  Hematology negative hematology ROS (+) Blood dyscrasia, anemia   Anesthesia Other Findings   Reproductive/Obstetrics negative OB ROS                             Anesthesia Physical Anesthesia Plan  ASA: 2  Anesthesia Plan: General and General ETT   Post-op Pain Management: Regional block*   Induction:   PONV Risk Score and Plan: Ondansetron  Airway Management Planned:   Additional Equipment:   Intra-op Plan:   Post-operative Plan:   Informed Consent: I have reviewed the patients History and Physical, chart, labs and discussed the procedure including the risks, benefits and alternatives for the proposed anesthesia with the patient or authorized representative who has indicated his/her understanding and acceptance.     Dental Advisory Given  Plan Discussed with: CRNA  Anesthesia Plan Comments:        Anesthesia Quick Evaluation

## 2022-11-01 NOTE — Interval H&P Note (Signed)
History and Physical Interval Note:  11/01/2022 7:25 AM  Tammy Jensen  has presented today for surgery, with the diagnosis of RIGHT ROTATOR CUFF ARTHROPATHY.  The various methods of treatment have been discussed with the patient and family. After consideration of risks, benefits and other options for treatment, the patient has consented to  Procedure(s) with comments: RIGHT REVERSE SHOULDER ARTHROPLASTY (Right) - RNFA NEEDED as a surgical intervention.  The patient's history has been reviewed, patient examined, no change in status, stable for surgery.  I have reviewed the patient's chart and labs.  Questions were answered to the patient's satisfaction.    Patient stopped taking hydroxychloroquine 2 weeks prior to surgery.  Will resume after surgery.  1 dose of mupirocin prior to surgery.  Ready to proceed.   Oliver Barre

## 2022-11-01 NOTE — Anesthesia Procedure Notes (Signed)
Anesthesia Regional Block: Interscalene brachial plexus block   Pre-Anesthetic Checklist: , timeout performed,  Correct Patient, Correct Site, Correct Laterality,  Correct Procedure, Correct Position, site marked,  Risks and benefits discussed,  Surgical consent,  Pre-op evaluation,  At surgeon's request and post-op pain management  Laterality: Right  Prep: chloraprep       Needles:   Needle Type: Stimulator Needle - 80     Needle Length: 9cm  Needle Gauge: 20     Additional Needles:   Procedures:, nerve stimulator,,, ultrasound used (permanent image in chart),,     Nerve Stimulator or Paresthesia:  Response: thenar twitch, 0.4 mA  Additional Responses:   Narrative:  Start time: 11/01/2022 7:24 AM End time: 11/01/2022 7:30 AM Injection made incrementally with aspirations every 5 mL.  Performed by: Personally

## 2022-11-01 NOTE — Anesthesia Postprocedure Evaluation (Signed)
Anesthesia Post Note  Patient: Tammy Jensen  Procedure(s) Performed: RIGHT REVERSE SHOULDER ARTHROPLASTY (Right: Shoulder)  Patient location during evaluation: Phase II Anesthesia Type: General Level of consciousness: awake Pain management: pain level controlled Vital Signs Assessment: post-procedure vital signs reviewed and stable Respiratory status: spontaneous breathing and respiratory function stable Cardiovascular status: blood pressure returned to baseline and stable Postop Assessment: no headache and no apparent nausea or vomiting Anesthetic complications: no Comments: Late entry   No notable events documented.   Last Vitals:  Vitals:   11/01/22 1102 11/01/22 1115  BP: (!) 109/52 104/67  Pulse: 84 (!) 58  Resp: (!) 26 15  Temp:    SpO2: 96% 95%    Last Pain:  Vitals:   11/01/22 1045  TempSrc:   PainSc: 0-No pain                 Windell Norfolk

## 2022-11-01 NOTE — Transfer of Care (Signed)
Immediate Anesthesia Transfer of Care Note  Patient: Tammy Jensen  Procedure(s) Performed: RIGHT REVERSE SHOULDER ARTHROPLASTY (Right: Shoulder)  Patient Location: PACU  Anesthesia Type:General  Level of Consciousness: awake, alert , oriented, and patient cooperative  Airway & Oxygen Therapy: Patient Spontanous Breathing and Patient connected to nasal cannula oxygen  Post-op Assessment: Report given to RN, Post -op Vital signs reviewed and stable, and Patient moving all extremities X 4  Post vital signs: Reviewed and stable  Last Vitals:  Vitals Value Taken Time  BP 128/72 11/01/22 1019  Temp 36.3 C 11/01/22 1019  Pulse 76 11/01/22 1021  Resp 15 11/01/22 1021  SpO2 99 % 11/01/22 1021  Vitals shown include unfiled device data.  Last Pain:  Vitals:   11/01/22 0656  TempSrc: Oral  PainSc: 6       Patients Stated Pain Goal: 8 (11/01/22 0656)  Complications: No notable events documented.

## 2022-11-02 DIAGNOSIS — M12811 Other specific arthropathies, not elsewhere classified, right shoulder: Secondary | ICD-10-CM | POA: Diagnosis not present

## 2022-11-02 MED ORDER — ONDANSETRON HCL 4 MG PO TABS: 4.0000 mg | ORAL_TABLET | Freq: Three times a day (TID) | ORAL | 0 refills | Status: AC | PRN

## 2022-11-02 MED ORDER — MELOXICAM 7.5 MG PO TABS: 7.5000 mg | ORAL_TABLET | Freq: Every day | ORAL | 0 refills | Status: AC

## 2022-11-02 MED ORDER — ACETAMINOPHEN 500 MG PO TABS: 1000.0000 mg | ORAL_TABLET | Freq: Three times a day (TID) | ORAL | 0 refills | Status: AC

## 2022-11-02 MED ORDER — OXYCODONE HCL 5 MG PO TABS: 5.0000 mg | ORAL_TABLET | ORAL | 0 refills | Status: AC | PRN

## 2022-11-02 MED ORDER — ASPIRIN 81 MG PO TBEC: 81.0000 mg | DELAYED_RELEASE_TABLET | Freq: Two times a day (BID) | ORAL | 0 refills | Status: AC

## 2022-11-02 NOTE — Progress Notes (Addendum)
   11/02/22 0935  TOC Brief Assessment  Insurance and Status Reviewed  Patient has primary care physician Yes  Home environment has been reviewed from home  Prior level of function: independent  Prior/Current Home Services No current home services  Social Determinants of Health Reivew SDOH reviewed no interventions necessary  Readmission risk has been reviewed Yes  Transition of care needs no transition of care needs at this time    PT/OT recommending outpatient therapy for pt at dc. Discussed with Dr. Dallas Schimke who states that his office team will be taking care of the referral and that TOC will not need to follow up on this.  Transition of Care Department [facility]) has reviewed patient and no other TOC needs have been identified at this time. We will continue to monitor patient advancement through interdisciplinary progression rounds. If new patient transition needs arise, please place a TOC consult.

## 2022-11-02 NOTE — Discharge Summary (Signed)
Patient ID: Tammy Jensen MRN: [id number] DOB/AGE: Oct 14, 1950 72 y.o.  Admit date: 11/01/2022 Discharge date: 11/02/2022  Admission Diagnoses: Right shoulder rotator cuff arthropathy  Discharge Diagnoses:  Principal Problem:   Rotator cuff arthropathy of right shoulder   Past Medical History:  Diagnosis Date   Dysrhythmia    High cholesterol    Hypertension    IBS (irritable bowel syndrome)    Rheumatoid arthritis (HCC)    Tachycardia      Procedures Performed: Right Reverse Shoulder Arthroplasty  Discharged Condition: good  Hospital Course: Patient brought in as an outpatient for surgery.  Tolerated procedure well.  Was kept for monitoring overnight for pain control and medical monitoring postop and was found to be stable for DC home the morning after surgery.  Patient was evaluated by OT/OT prior to discharge.  Patient was instructed on specific activity restrictions and all questions were answered.  Patient was discharged on POD#1 in stable condition.  They will contact the clinic if they have any concerns upon discharge.    Consults: None  Significant Diagnostic Studies: No additional pertinent studies  Treatments: Surgery  Discharge Exam:  Vitals:   11/01/22 1452 11/01/22 1856  BP: 110/82 107/70  Pulse: 100 (!) 105  Resp: 18 17  Temp: 98.7 F (37.1 C) 98.6 F (37 C)  SpO2: 96% 100%     Alert and oriented, no acute distress  Sling and ice machine fitting appropriately. Dressing is clean dry and intact Active motion intact throughout the hand Sensation intact in the axillary nerve distribution. Fingers are warm and well perfused   CBC    Latest Ref Rng & Units 11/01/2022   10:49 AM 10/25/2022    3:25 PM 06/18/2022    9:18 AM  CBC  WBC 4.0 - 10.5 K/uL  5.9  7.9   Hemoglobin 12.0 - 15.0 g/dL 51.8  84.1  66.0   Hematocrit 36.0 - 46.0 % 37.9  41.3  46.1   Platelets 150 - 400 K/uL  215  247         Disposition: Discharge disposition:  01-Home or Self Care        Allergies as of 11/02/2022   No Known Allergies      Medication List     STOP taking these medications    indomethacin 25 MG capsule Commonly known as: INDOCIN   naproxen 375 MG tablet Commonly known as: NAPROSYN       TAKE these medications    acetaminophen 500 MG tablet Commonly known as: TYLENOL Take 2 tablets (1,000 mg total) by mouth every 8 (eight) hours for 14 days.   amLODipine 5 MG tablet Commonly known as: NORVASC Take 5 mg by mouth in the morning.   ARTIFICIAL TEAR SOLUTION OP Place 1 drop into both eyes 3 (three) times daily as needed (dry/irritated eyes.).   aspirin EC 81 MG tablet Take 1 tablet (81 mg total) by mouth in the morning and at bedtime. Swallow whole. What changed:  when to take this additional instructions   benazepril 40 MG tablet Commonly known as: LOTENSIN Take 40 mg by mouth in the morning.   Cholecalciferol 50 MCG (2000 UT) Tabs Take 2,000 Units by mouth in the morning.   cyanocobalamin 1000 MCG/ML injection Commonly known as: VITAMIN B12 Inject 1,000 mcg into the muscle every 30 (thirty) days.   fexofenadine 60 MG tablet Commonly known as: ALLEGRA Take 60 mg by mouth daily as needed for allergies or rhinitis.   fluticasone  50 MCG/ACT nasal spray Commonly known as: FLONASE Place 1-2 sprays into both nostrils daily as needed for allergies or rhinitis.   furosemide 20 MG tablet Commonly known as: LASIX Take 20 mg by mouth every other day.   gabapentin 300 MG capsule Commonly known as: NEURONTIN Take 1 capsule (300 mg total) by mouth 3 (three) times daily.   hydroxychloroquine 200 MG tablet Commonly known as: PLAQUENIL Take 1 tablet (200 mg total) by mouth 2 (two) times daily.   meloxicam 7.5 MG tablet Commonly known as: Mobic Take 1 tablet (7.5 mg total) by mouth daily for 14 days.   mupirocin ointment 2 % Commonly known as: BACTROBAN Place 1 Application into the nose 2 (two)  times daily. Apply to each nostril twice daily   ondansetron 4 MG tablet Commonly known as: Zofran Take 1 tablet (4 mg total) by mouth every 8 (eight) hours as needed for up to 14 days for nausea or vomiting.   oxyCODONE 5 MG immediate release tablet Commonly known as: Roxicodone Take 1 tablet (5 mg total) by mouth every 4 (four) hours as needed for up to 7 days.   Prolia 60 MG/ML Sosy injection Generic drug: denosumab Inject 60 mg into the skin every 6 (six) months.   rosuvastatin 10 MG tablet Commonly known as: CRESTOR Take 10 mg by mouth in the morning.   traZODone 50 MG tablet Commonly known as: DESYREL Take 50 mg by mouth at bedtime as needed for sleep.        Follow-up Information     Oliver Barre, MD Follow up.   Specialties: Orthopedic Surgery, Sports Medicine Why: 10-14 days for suture removal/incision check Contact information: [address] [place] [address] [phone]

## 2022-11-02 NOTE — Care Management Obs Status (Signed)
MEDICARE OBSERVATION STATUS NOTIFICATION   Patient Details  Name: Tammy Jensen MRN: [id number] Date of Birth: Jun 12, 1950   Medicare Observation Status Notification Given:  Yes    Stephaie Dardis Feliz Beam 11/02/2022, 11:01 AM

## 2022-11-02 NOTE — Evaluation (Signed)
Occupational Therapy Evaluation Patient Details Name: Tammy Jensen MRN: [id number] DOB: 1950/11/29 Today's Date: 11/02/2022   History of Present Illness Tammy Jensen is a 72 y.o. female with the following:  1. Nontraumatic complete tear of right rotator cuff  2. Chronic right shoulder pain. Status post Right reverse shoulder arthroplasty (per MD)   Clinical Impression   Pt agreeable to OT and PT co-evaluation. Pt reports family coming to stay with her and assist. Min to mod A needed for ADL's due to R UE limitations. Pt is able to ambulate independently. Pt is not recommended for further acute OT services and will be discharged to care of nursing staff for remaining length of stay.        If plan is discharge home, recommend the following: A little help with bathing/dressing/bathroom;Assistance with cooking/housework;Assist for transportation    Functional Status Assessment  Patient has had a recent decline in their functional status and demonstrates the ability to make significant improvements in function in a reasonable and predictable amount of time.  Equipment Recommendations  None recommended by OT    Recommendations for Other Services       Precautions / Restrictions Precautions Precautions: Fall Required Braces or Orthoses: Sling Restrictions Weight Bearing Restrictions: Yes RUE Weight Bearing: Non weight bearing      Mobility Bed Mobility Overal bed mobility: Needs Assistance Bed Mobility: Supine to Sit     Supine to sit: Min assist     General bed mobility comments: Assist to pull to sit.    Transfers Overall transfer level: Independent                        Balance Overall balance assessment: Mild deficits observed, not formally tested                                         ADL either performed or assessed with clinical judgement   ADL Overall ADL's : Needs assistance/impaired     Grooming: Minimal  assistance;Sitting   Upper Body Bathing: Minimal assistance;Sitting   Lower Body Bathing: Moderate assistance;Sitting/lateral leans   Upper Body Dressing : Minimal assistance   Lower Body Dressing: Moderate assistance   Toilet Transfer: Independent;Ambulation Toilet Transfer Details (indicate cue type and reason): Simulated via ambulation in the hall.         Functional mobility during ADLs: Independent       Vision Baseline Vision/History: 1 Wears glasses Ability to See in Adequate Light: 1 Impaired Patient Visual Report: No change from baseline Vision Assessment?: No apparent visual deficits     Perception Perception: Not tested       Praxis Praxis: Not tested       Pertinent Vitals/Pain Pain Assessment Pain Assessment: 0-10 Pain Score: 3  Pain Location: R shoulder Pain Descriptors / Indicators: Aching Pain Intervention(s): Limited activity within patient's tolerance, Monitored during session, Repositioned     Extremity/Trunk Assessment Upper Extremity Assessment Upper Extremity Assessment: Left hand dominant;RUE deficits/detail RUE Deficits / Details: Immobilized in don joy III sling that was adjsuted for better fit once standing.   Lower Extremity Assessment Lower Extremity Assessment: Defer to PT evaluation   Cervical / Trunk Assessment Cervical / Trunk Assessment: Normal   Communication Communication Communication: No apparent difficulties   Cognition Arousal: Alert Behavior During Therapy: WFL for tasks assessed/performed Overall Cognitive Status: Within Functional Limits for tasks  assessed                                                        Home Living Family/patient expects to be discharged to:: Private residence Living Arrangements: Alone Available Help at Discharge: Family;Available 24 hours/day Type of Home: Mobile home Home Access: Stairs to enter Entrance Stairs-Number of Steps: 1 Entrance Stairs-Rails:   (Railing in the center and can be used with either hand.) Home Layout: One level     Bathroom Shower/Tub: Producer, television/film/video: Standard Bathroom Accessibility: Yes How Accessible: Accessible via wheelchair;Accessible via walker Home Equipment: Shower seat;BSC/3in1;Grab bars - tub/shower          Prior Functioning/Environment Prior Level of Function : Independent/Modified Independent             Mobility Comments: Tourist information centre manager without AD ADLs Comments: Independent                                Co-evaluation PT/OT/SLP Co-Evaluation/Treatment: Yes Reason for Co-Treatment: To address functional/ADL transfers   OT goals addressed during session: ADL's and self-care      AM-PAC OT "6 Clicks" Daily Activity     Outcome Measure Help from another person eating meals?: None Help from another person taking care of personal grooming?: A Little Help from another person toileting, which includes using toliet, bedpan, or urinal?: A Little Help from another person bathing (including washing, rinsing, drying)?: A Little Help from another person to put on and taking off regular upper body clothing?: A Little Help from another person to put on and taking off regular lower body clothing?: A Little 6 Click Score: 19   End of Session    Activity Tolerance: Patient tolerated treatment well Patient left: Other (comment) (Standing in the room.)  OT Visit Diagnosis: Muscle weakness (generalized) (M62.81)                Time: 5956-3875 OT Time Calculation (min): 13 min Charges:  OT General Charges $OT Visit: 1 Visit OT Evaluation $OT Eval Low Complexity: 1 Low  Teegan Brandis OT, MOT   Danie Chandler 11/02/2022, 9:08 AM

## 2022-11-02 NOTE — Discharge Instructions (Signed)
Tori Cupps A. Amedeo Kinsman, MD [facility] [address] [place],  [place]  [address] Phone: [phone] Fax: [phone]    [place] may leave the operative dressing in place until your follow-up appointment. KEEP THE INCISIONS CLEAN AND DRY. There may be a small amount of fluid/bleeding leaking at the surgical site. This is normal after surgery.  If it fills with liquid or blood please call us immediately to change it for you. Use the provided ice machine or Ice packs as often as possible for the first 3-4 days, then as needed for pain relief.  Keep a layer of cloth or a shirt between your skin and the cooling unit to prevent frost bite as it can get very cold.  SHOWERING: - You may shower on Post-Op Day #3.  - The dressing is water resistant but do not scrub it as it may start to peel up.   - You may remove the sling for showering, but keep a water resistant pillow under the arm to keep both the  elbow and shoulder away from the body (mimicking the abduction sling).  - Gently pat the area dry.  - Do not soak the shoulder in water. Do not go swimming in the pool or ocean until your sutures are removed. - KEEP THE INCISIONS CLEAN AND DRY.  EXERCISES Wear the sling at all times except when doing your exercises. You may remove the sling for showering, but keep the arm across the chest or in a secondary sling.    Accidental/Purposeful External Rotation and shoulder flexion (reaching behind you) is to be avoided at all costs for the first month. It is ok to come out of your sling if your are sitting and have assistance for eating.  Do not lift anything heavier than 1 pound until we discuss it further in clinic. Please perform the exercises:   Elbow / Hand / Wrist  Range of Motion Exercises Grip strengthening   REGIONAL ANESTHESIA (NERVE BLOCKS) The anesthesia team may have performed a nerve block  for you if safe in the setting of your care.  This is a great tool used to minimize pain.  Typically the block may start wearing off overnight but the long acting medicine may last for 3-4 days.  The nerve block wearing off can be a challenging period but please utilize your as needed pain medications to try and manage this period.    POST-OP MEDICATIONS- Multimodal approach to pain control In general your pain will be controlled with a combination of substances.  Prescriptions unless otherwise discussed are electronically sent to your pharmacy.  This is a carefully made plan we use to minimize narcotic use.     Meloxicam - Anti-inflammatory medication taken on a scheduled basis Acetaminophen - Non-narcotic pain medicine taken on a scheduled basis  Oxycodone - This is a strong narcotic, to be used only on an "as needed" basis for pain. Aspirin 81mg  - This medicine is used to minimize the risk of blood clots after surgery. Zofran -  take as needed for nausea  Meloxicam/Celebrex - these are anti-inflammatory and pain relievers.  Do not take additional ibuprofen, naproxen or other NSAID while taking this medicine.   FOLLOW-UP If you develop a Fever (>101.5), Redness or Drainage from the surgical incision site, please call our office to arrange for an evaluation. Please call the office to schedule a follow-up appointment for a  wound check, 7-10 days post-operatively.  

## 2022-11-02 NOTE — Progress Notes (Signed)
Alert and oriented.  Right upper extremity warm with appropriate color and movement with no numbness and mild pain.  Arm in sling.  Discharge instructions reviewed and scripts sent to pharmacy.  Waiting on ride to arrive for drive home

## 2022-11-02 NOTE — Evaluation (Signed)
Physical Therapy Evaluation Patient Details Name: Tammy Jensen MRN: [id number] DOB: March 10, 1950 Today's Date: 11/02/2022  History of Present Illness  Tammy Jensen is a 72 y.o. female with the following:  1. Nontraumatic complete tear of right rotator cuff  2. Chronic right shoulder pain. Status post Right reverse shoulder arthroplasty   Clinical Impression  Patient functioning near baseline for functional mobility and gait other than having mild difficulty for completing bed mobility due to RUE in immobilizer, and occasional drifting right/left mostly due to decreased arm swing, otherwise demonstrates good return for walking in room/hallway without loss of balance or need for an AD.  Plan:  Patient discharged from physical therapy to care of nursing for ambulation daily as tolerated for length of stay.          If plan is discharge home, recommend the following: Help with stairs or ramp for entrance;Assistance with cooking/housework;A little help with bathing/dressing/bathroom;A little help with walking and/or transfers   Can travel by private vehicle        Equipment Recommendations None recommended by PT  Recommendations for Other Services       Functional Status Assessment Patient has had a recent decline in their functional status and demonstrates the ability to make significant improvements in function in a reasonable and predictable amount of time.     Precautions / Restrictions Precautions Precautions: Fall Required Braces or Orthoses: Sling Restrictions Weight Bearing Restrictions: Yes RUE Weight Bearing: Non weight bearing      Mobility  Bed Mobility Overal bed mobility: Needs Assistance Bed Mobility: Supine to Sit     Supine to sit: Min assist     General bed mobility comments: Assist to pull to sit.    Transfers Overall transfer level: Independent                      Ambulation/Gait Ambulation/Gait assistance: Modified independent  (Device/Increase time) Gait Distance (Feet): 100 Feet Assistive device: None Gait Pattern/deviations: WFL(Within Functional Limits), Drifts right/left Gait velocity: decreased     General Gait Details: grossly WFL with occasional drifting left/right and able to self correct without assistance or loss of balance  Stairs            Wheelchair Mobility     Tilt Bed    Modified Rankin (Stroke Patients Only)       Balance Overall balance assessment: Mild deficits observed, not formally tested                                           Pertinent Vitals/Pain Pain Assessment Pain Assessment: 0-10 Pain Score: 3  Pain Location: R shoulder Pain Descriptors / Indicators: Aching Pain Intervention(s): Limited activity within patient's tolerance, Monitored during session, Repositioned    Home Living Family/patient expects to be discharged to:: Private residence Living Arrangements: Alone Available Help at Discharge: Family;Available 24 hours/day Type of Home: Mobile home Home Access: Stairs to enter Entrance Stairs-Rails:  (Railing in the center and can be used with either hand.) Entrance Stairs-Number of Steps: 1   Home Layout: One level Home Equipment: Shower seat;BSC/3in1;Grab bars - tub/shower      Prior Function Prior Level of Function : Independent/Modified Independent             Mobility Comments: Tourist information centre manager without AD ADLs Comments: Independent     Extremity/Trunk Assessment   Upper Extremity  Assessment Upper Extremity Assessment: Defer to OT evaluation RUE Deficits / Details: Immobilized in don joy III sling that was adjsuted for better fit once standing.    Lower Extremity Assessment Lower Extremity Assessment: Overall WFL for tasks assessed    Cervical / Trunk Assessment Cervical / Trunk Assessment: Normal  Communication   Communication Communication: No apparent difficulties  Cognition Arousal: Alert Behavior  During Therapy: WFL for tasks assessed/performed Overall Cognitive Status: Within Functional Limits for tasks assessed                                          General Comments      Exercises     Assessment/Plan    PT Assessment All further PT needs can be met in the next venue of care  PT Problem List Decreased strength;Decreased range of motion;Decreased activity tolerance;Decreased balance;Decreased mobility       PT Treatment Interventions      PT Goals (Current goals can be found in the Care Plan section)  Acute Rehab PT Goals Patient Stated Goal: return home PT Goal Formulation: With patient Time For Goal Achievement: 11/02/22 Potential to Achieve Goals: Good    Frequency       Co-evaluation PT/OT/SLP Co-Evaluation/Treatment: Yes Reason for Co-Treatment: To address functional/ADL transfers PT goals addressed during session: Mobility/safety with mobility;Balance OT goals addressed during session: ADL's and self-care       AM-PAC PT "6 Clicks" Mobility  Outcome Measure Help needed turning from your back to your side while in a flat bed without using bedrails?: None Help needed moving from lying on your back to sitting on the side of a flat bed without using bedrails?: A Little Help needed moving to and from a bed to a chair (including a wheelchair)?: None Help needed standing up from a chair using your arms (e.g., wheelchair or bedside chair)?: None Help needed to walk in hospital room?: A Little Help needed climbing 3-5 steps with a railing? : A Little 6 Click Score: 21    End of Session   Activity Tolerance: Patient tolerated treatment well Patient left: in bed;with call bell/phone within reach Nurse Communication: Mobility status PT Visit Diagnosis: Unsteadiness on feet (R26.81);Other abnormalities of gait and mobility (R26.89);Muscle weakness (generalized) (M62.81)    Time: 7628-3151 PT Time Calculation (min) (ACUTE ONLY): 20  min   Charges:   PT Evaluation $PT Eval Moderate Complexity: 1 Mod PT Treatments $Therapeutic Activity: 8-22 mins PT General Charges $$ ACUTE PT VISIT: 1 Visit         11:45 AM, 11/02/22 Ocie Bob, MPT Physical Therapist with [facility] 336 [phone] office [phone] mobile phone

## 2022-11-03 ENCOUNTER — Telehealth: Payer: Self-pay | Admitting: Orthopedic Surgery

## 2022-11-03 ENCOUNTER — Encounter (HOSPITAL_COMMUNITY): Payer: Self-pay | Admitting: Orthopedic Surgery

## 2022-11-03 NOTE — Telephone Encounter (Signed)
Spoke with pt and went over information from provider. Pt verbalized understanding.

## 2022-11-03 NOTE — Telephone Encounter (Signed)
Dr. Dallas Schimke pt - spoke w/the patient, she had surgery on 10/28, she stated that she didn't bring the "cooler" home w/her and there is still a piece that connects to the cooler on her.  She wants to know what to do.  [phone]

## 2022-11-05 ENCOUNTER — Encounter: Payer: Self-pay | Admitting: Physician Assistant

## 2022-11-12 ENCOUNTER — Ambulatory Visit: Payer: 59 | Admitting: Physician Assistant

## 2022-11-12 ENCOUNTER — Encounter: Payer: Self-pay | Admitting: Physician Assistant

## 2022-11-12 DIAGNOSIS — M0579 Rheumatoid arthritis with rheumatoid factor of multiple sites without organ or systems involvement: Secondary | ICD-10-CM

## 2022-11-12 DIAGNOSIS — M7062 Trochanteric bursitis, left hip: Secondary | ICD-10-CM

## 2022-11-12 DIAGNOSIS — E785 Hyperlipidemia, unspecified: Secondary | ICD-10-CM

## 2022-11-12 DIAGNOSIS — Z79899 Other long term (current) drug therapy: Secondary | ICD-10-CM

## 2022-11-12 DIAGNOSIS — J302 Other seasonal allergic rhinitis: Secondary | ICD-10-CM

## 2022-11-12 DIAGNOSIS — Z8719 Personal history of other diseases of the digestive system: Secondary | ICD-10-CM

## 2022-11-12 DIAGNOSIS — M810 Age-related osteoporosis without current pathological fracture: Secondary | ICD-10-CM

## 2022-11-12 DIAGNOSIS — I10 Essential (primary) hypertension: Secondary | ICD-10-CM

## 2022-11-12 DIAGNOSIS — G8929 Other chronic pain: Secondary | ICD-10-CM

## 2022-11-12 DIAGNOSIS — R7989 Other specified abnormal findings of blood chemistry: Secondary | ICD-10-CM

## 2022-11-12 DIAGNOSIS — Q667 Congenital pes cavus, unspecified foot: Secondary | ICD-10-CM

## 2022-11-12 DIAGNOSIS — E559 Vitamin D deficiency, unspecified: Secondary | ICD-10-CM

## 2022-11-12 DIAGNOSIS — M47816 Spondylosis without myelopathy or radiculopathy, lumbar region: Secondary | ICD-10-CM

## 2022-11-17 ENCOUNTER — Other Ambulatory Visit (INDEPENDENT_AMBULATORY_CARE_PROVIDER_SITE_OTHER): Payer: 59

## 2022-11-17 ENCOUNTER — Telehealth: Payer: Self-pay | Admitting: Orthopedic Surgery

## 2022-11-17 ENCOUNTER — Encounter: Payer: Self-pay | Admitting: Orthopedic Surgery

## 2022-11-17 ENCOUNTER — Ambulatory Visit (INDEPENDENT_AMBULATORY_CARE_PROVIDER_SITE_OTHER): Payer: 59 | Admitting: Orthopedic Surgery

## 2022-11-17 DIAGNOSIS — Z96611 Presence of right artificial shoulder joint: Secondary | ICD-10-CM | POA: Diagnosis not present

## 2022-11-17 DIAGNOSIS — M75121 Complete rotator cuff tear or rupture of right shoulder, not specified as traumatic: Secondary | ICD-10-CM

## 2022-11-17 MED ORDER — ACETAMINOPHEN 500 MG PO TABS: 1000.0000 mg | ORAL_TABLET | Freq: Three times a day (TID) | ORAL | 0 refills | Status: AC | PRN

## 2022-11-17 MED ORDER — OXYCODONE HCL 5 MG PO TABS: 5.0000 mg | ORAL_TABLET | ORAL | 0 refills | Status: AC | PRN

## 2022-11-17 NOTE — Telephone Encounter (Signed)
Dr. Dallas Schimke pt - [facility] [phone] called, they stated they do not take this pts insurance, stated she has [employer] dual and they do not take [place] [employer].

## 2022-11-17 NOTE — Addendum Note (Signed)
Addended by: Thane Edu A on: 11/17/2022 10:38 AM   Modules accepted: Orders

## 2022-11-17 NOTE — Progress Notes (Signed)
Orthopaedic Postop Note  Assessment: Tammy Jensen is a 72 y.o. female s/p Right Reverse Shoulder Arthroplasty  DOS: 11/01/2022  Plan: Sutures were trimmed, steri strips were placed Ok to remove the abduction pillow; can stop using the sling around 4 weeks postop Physical therapy prescription and protocol provided Anticipated progression discussed, XR reviewed in clinic Follow up 4 weeks   Meds ordered this encounter  Medications   oxyCODONE (ROXICODONE) 5 MG immediate release tablet    Sig: Take 1 tablet (5 mg total) by mouth every 4 (four) hours as needed for up to 7 days.    Dispense:  30 tablet    Refill:  0     Follow-up: Return in about 4 weeks (around 12/15/2022).  XR at next visit: Right shoulder  Subjective:  Chief Complaint  Patient presents with   Routine Post Op    R RSA DOS 11/01/22    History of Present Illness: Tammy Jensen is a 72 y.o. female who presents following the above stated procedure.  Surgery was approximately 2 weeks ago.  She is doing well.  Pain is controlled.  She has yet to start any therapy.  She was interested in home health physical therapy, but this was unavailable in her county.  Her granddaughter has been helping her at home.  She continues to take medications as needed.  No numbness or tingling.  She states that she was running some errands yesterday, and noted some worsening pain.  Review of Systems: No fevers or chills No numbness or tingling No Chest Pain No shortness of breath   Objective: There were no vitals taken for this visit.  Physical Exam:  Alert and oriented, no acute distress  Surgical incision is healing well, no surrounding erythema or drainage Sensation intact in the axillary nerve distribution Active motion intact in the hand 2+ radial pulse Sensation intact throughout the hand Tolerates gentle range of motion of the shoulder - passive forward flexion to 90, passive abduction to 90   IMAGING: I  personally ordered and reviewed the following images:  XR of the Right shoulder obtained in clinic today and demonstrate Reverse Shoulder Arthroplasty  with implants in good position.  No evidence of acute injury or subsidence of implants.  Alignment remains unchanged compared to immediate postop XR.  Impression: Right shoulder arthroplasty in good position   Oliver Barre, MD 11/17/2022 10:14 AM

## 2022-11-18 NOTE — Telephone Encounter (Signed)
Dr. Dallas Schimke pt - pt called back, stated she would like to go to [place].

## 2022-11-18 NOTE — Telephone Encounter (Signed)
Called and left pt a VM letting her know [facility] does not accept her insurance. Asked for her to call and let us know if she would like to go to [place], [place], or [place].

## 2022-11-19 NOTE — Telephone Encounter (Signed)
Information faxed

## 2022-11-22 ENCOUNTER — Telehealth: Payer: Self-pay | Admitting: Orthopedic Surgery

## 2022-11-22 DIAGNOSIS — Z96611 Presence of right artificial shoulder joint: Secondary | ICD-10-CM

## 2022-11-22 NOTE — Telephone Encounter (Signed)
Dr. Dallas Schimke Tammy Jensen - Tammy Jensen lvm stating that she hasn't heard anything about Tammy Jensen yet.  [phone]

## 2022-11-23 ENCOUNTER — Telehealth: Payer: Self-pay | Admitting: Orthopedic Surgery

## 2022-11-23 NOTE — Telephone Encounter (Signed)
Pt states no one has received her orders and not sure if [facility] will take her insurance. Asked pt if she would like to have order placed in [place], she would like to try this at this point so she can get into therapy as soon as possible order placed for OT.

## 2022-11-23 NOTE — Telephone Encounter (Signed)
Patients grandaughter/Akaela calling, states she found a facility that can see pt right away to begin therapy. Needs order for P.T. Post op notes and shoulder protocol faxed to [facility] fax to [phone].please call to advise when faxed.Rayetta Humphrey ph: [phone], pts phone number [phone]

## 2022-11-23 NOTE — Telephone Encounter (Signed)
Dr. Dallas Schimke pt - pt called again, stated that [facility] PT states they have not received the referral.  The patient stated she doesn't care where she goes she just wants to get into therapy.  [phone]

## 2022-11-23 NOTE — Telephone Encounter (Signed)
Dr. Dallas Schimke pt - pt lvm stating she hasn't heard anything regarding her PT.  [phone]

## 2022-11-25 NOTE — Progress Notes (Unsigned)
Office Visit Note  Patient: Tammy Jensen             Date of Birth: August 02, 1950           MRN: [id number]             PCP: Joana Reamer, DO Referring: Joana Reamer, DO Visit Date: 12/09/2022 Occupation: @GUAROCC @  Subjective:  Right trochanteric bursitis   History of Present Illness: Tammy Jensen is a 72 y.o. female with history of seropositive rheumatoid arthritis.  Patient is taking plaquenil 200 mg 1 tablet by mouth twice daily.  She is tolerating Plaquenil without any side effects and has not had any interruptions in therapy.  She denies any signs or symptoms of a rheumatoid arthritis flare.  Patient states that she underwent a right total reverse shoulder replacement on 11/01/2022.  She started physical therapy last week and has been performing home exercises.  She has been taking Tylenol as needed for pain relief as well as gabapentin as prescribed which has been managing her symptoms.  She has transitioned from sleeping in a recliner to sleeping in her bed again. Patient states that she has had a recurrence of trochanter bursitis of both hips over the past several weeks.  She had bilateral trochanter bursa cortisone injections performed in June 2024 which righted significant relief but her symptoms have returned.  Patient states that her discomfort on the lateral aspect of her right hip is more severe than the left side currently.  She has requested a right trochanteric bursa cortisone injection today. Patient states that she is scheduled to receive her next dose of Prolia on 01/06/23.       Activities of Daily Living:  Patient reports morning stiffness for less then 5 minutes.   Patient Reports nocturnal pain.  Difficulty dressing/grooming: Reports Difficulty climbing stairs: Denies Difficulty getting out of chair: Denies Difficulty using hands for taps, buttons, cutlery, and/or writing: Reports  Review of Systems  Constitutional:  Negative for fatigue.  HENT:   Positive for mouth dryness. Negative for mouth sores.   Eyes:  Positive for dryness.  Respiratory:  Negative for shortness of breath.   Cardiovascular:  Negative for chest pain and palpitations.  Gastrointestinal:  Negative for blood in stool, constipation and diarrhea.  Endocrine: Negative for increased urination.  Genitourinary:  Negative for involuntary urination.  Musculoskeletal:  Positive for joint pain, joint pain, myalgias, morning stiffness and myalgias. Negative for gait problem, joint swelling, muscle weakness and muscle tenderness.  Skin:  Negative for color change, rash, hair loss and sensitivity to sunlight.  Allergic/Immunologic: Negative for susceptible to infections.  Neurological:  Negative for dizziness and headaches.  Hematological:  Negative for swollen glands.  Psychiatric/Behavioral:  Positive for sleep disturbance. Negative for depressed mood. The patient is not nervous/anxious.     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/2020   Rheumatoid arthritis with rheumatoid factor of multiple sites without organ or systems involvement (HCC) 09/06/2019   High risk medication use 09/06/2019   Elevated LFTs 09/06/2019   Age-related osteoporosis without current pathological fracture 09/06/2019   Dyslipidemia 08/14/2019   Abnormal electrocardiogram 03/12/2019   Chest pain 03/12/2019   HTN (hypertension) 03/12/2019   Obesity 03/12/2019  Family history of colon cancer 05/28/2016    Past Medical History:  Diagnosis Date   Dysrhythmia    High cholesterol    Hypertension    IBS (irritable bowel syndrome)    Rheumatoid arthritis (HCC)    Tachycardia     Family History  Problem Relation Age  of Onset   Colon cancer Father    Colon cancer Sister    Heart disease Mother    Dementia Mother    Arthritis Mother    Hemachromatosis Son    Hemachromatosis Son    Past Surgical History:  Procedure Laterality Date   BIOPSY  04/14/2022   Procedure: BIOPSY;  Surgeon: Dolores Frame, MD;  [facility];  Service: Gastroenterology;;   COLONOSCOPY N/A 10/07/2016   Procedure: COLONOSCOPY;  Surgeon: Malissa Hippo, MD;  [facility];  Service: Endoscopy;  Laterality: N/A;  730   COLONOSCOPY WITH PROPOFOL N/A 04/14/2022   Procedure: COLONOSCOPY WITH PROPOFOL;  Surgeon: Dolores Frame, MD;  [facility];  Service: Gastroenterology;  Laterality: N/A;  915am, asa 2   POLYPECTOMY  04/14/2022   Procedure: POLYPECTOMY;  Surgeon: Dolores Frame, MD;  [facility];  Service: Gastroenterology;;   RECONSTRUCTION OF EYELID  09/02/2021   REVERSE SHOULDER ARTHROPLASTY Right 11/01/2022   Procedure: RIGHT REVERSE SHOULDER ARTHROPLASTY;  Surgeon: Oliver Barre, MD;  Location: [facility];  Service: Orthopedics;  Laterality: Right;  RNFA NEEDED   TUBAL LIGATION     VAGINAL HYSTERECTOMY N/A 10/07/2021   Procedure: HYSTERECTOMY VAGINAL;  Surgeon: Lazaro Arms, MD;  Location: [facility];  Service: Gynecology;  Laterality: N/A;   Social History   Social History Narrative   Not on file   Immunization History  Administered Date(s) Administered   PFIZER(Purple Top)SARS-COV-2 Vaccination 09/20/2019, 10/15/2019     Objective: Vital Signs: BP 127/80 (BP Location: Left Arm, Patient Position: Sitting, Cuff Size: Normal)   Pulse 69   Resp 14   Ht 5\' 2"  (1.575 m)   Wt 160 lb (72.6 kg)   BMI 29.26 kg/m    Physical Exam Vitals and nursing note reviewed.  Constitutional:      Appearance: She is well-developed.  HENT:     Head: Normocephalic and atraumatic.  Eyes:     Conjunctiva/sclera: Conjunctivae normal.  Cardiovascular:     Rate  and Rhythm: Normal rate and regular rhythm.     Heart sounds: Normal heart sounds.  Pulmonary:     Effort: Pulmonary effort is normal.     Breath sounds: Normal breath sounds.  Abdominal:     General: Bowel sounds are normal.     Palpations: Abdomen is soft.  Musculoskeletal:     Cervical back: Normal range of motion.  Lymphadenopathy:     Cervical: No cervical adenopathy.  Skin:    General: Skin is warm and dry.     Capillary Refill: Capillary refill takes less than 2 seconds.  Neurological:     Mental Status: She is alert and oriented to person, place, and time.  Psychiatric:        Behavior: Behavior normal.      Musculoskeletal Exam: C-spine has good ROM. Right total shoulder replacement has limited ROM.  Left shoulder has full ROM.  Elbow joints, wrist joints, MCPs, PIPs, and DIPs good ROM with no synovitis.  Complete fist formation bilaterally.  Hip joints have good ROM.  Tenderness over both trochanteric bursa, right worse than left.  Knee  joints have good ROM with no warmth or effusion.  Ankle joints have good ROM with no tenderness or joint swelling.   CDAI Exam: CDAI Score: -- Patient Global: 20 / 100; Provider Global: 20 / 100 Swollen: --; Tender: -- Joint Exam 12/09/2022   No joint exam has been documented for this visit   There is currently no information documented on the homunculus. Go to the Rheumatology activity and complete the homunculus joint exam.  Investigation: No additional findings.  Imaging: DG Shoulder Right  Result Date: 11/21/2022 XR of the Right shoulder obtained in clinic today and demonstrate Reverse Shoulder Arthroplasty  with implants in good position.  No evidence of acute injury or subsidence of implants.  Alignment remains unchanged compared to immediate postop XR.  Impression: Right shoulder arthroplasty in good position    Recent Labs: Lab Results  Component Value Date   WBC 5.9 10/25/2022   HGB 12.6 11/01/2022   PLT 215  10/25/2022   NA 138 10/25/2022   K 3.5 10/25/2022   CL 108 10/25/2022   CO2 23 10/25/2022   GLUCOSE 102 (H) 10/25/2022   BUN 15 10/25/2022   CREATININE 0.77 10/25/2022   BILITOT 0.8 06/18/2022   ALKPHOS 57 02/28/2022   AST 23 06/18/2022   ALT 22 06/18/2022   PROT 7.1 06/18/2022   ALBUMIN 4.5 02/28/2022   CALCIUM 8.7 (L) 10/25/2022   GFRAA 89 04/25/2020    Speciality Comments: PLQ Eye Exam: 05/17/2022 WNL  [employer]. Follow up in 1 year.  Procedures:  Large Joint Inj: R greater trochanter on 12/09/2022 8:55 AM Indications: pain Details: 27 G 1.5 in needle, lateral approach  Arthrogram: No  Medications: 1.5 mL lidocaine 1 %; 40 mg triamcinolone acetonide 40 MG/ML Aspirate: 0 mL Outcome: tolerated well, no immediate complications Procedure, treatment alternatives, risks and benefits explained, specific risks discussed. Consent was given by the patient. Immediately prior to procedure a time out was called to verify the correct patient, procedure, equipment, support staff and site/side marked as required. Patient was prepped and draped in the usual sterile fashion.     Allergies: Patient has no known allergies.     Assessment / Plan:     Visit Diagnoses: Rheumatoid arthritis with rheumatoid factor of multiple sites without organ or systems involvement [facility]): She has no synovitis on examination today.  She has not had any signs or symptoms of a rheumatoid arthritis flare.  She has clinically been doing well taking Plaquenil 200 mg 1 tablet by mouth twice daily.  She continues to tolerate Plaquenil without any side effects and has not had any interruptions in therapy.  No inflammation was noted on examination today.  She will remain on Plaquenil as monotherapy.  A refill was sent to the pharmacy today.  She was advised to notify us if she develops signs or symptoms of a flare. She will follow up in 5 months or sooner if needed.   High risk medication use - Plaquenil 200  mg 1 tablet by mouth twice daily started on August 14, 2019.  PLQ Eye Exam: 05/17/2022 WNL [employer]. Follow up in 1 year.  CBC and BMP updated on 10/25/22. Orders for CBC and CMP released today.   Plan: CBC with Differential/Platelet, COMPLETE METABOLIC PANEL WITH GFR  Status post reverse total replacement of right shoulder: Performed by Dr. Dallas Schimke on 11/01/22.  Started PT last week.  Her pain level has been managed with the use of gabapentin and Tylenol.  She has transition from sleeping in a recliner back to sleeping in her bed.  Chronic left shoulder pain: She has good range of motion of the left shoulder joint on examination today.  No tenderness upon palpation.  Trochanteric bursitis of both hips: Patient presents today with increased discomfort on the lateral aspect of both hips consistent with jugular bursitis.  Her symptoms have been most severe on the right side.  She had bilateral trochanteric bursa cortisone injections performed on 06/18/2022 which righted significant relief but her symptoms recurred about 2 to 3 weeks ago.  She has requested a right trochanteric bursa cortisone injection today.  She tolerated the procedure well.  Procedure note was completed above.  Aftercare was discussed.She will notify us when and if she would like to return for left trochanteric bursa cortisone injection in the future.  Lumbar facet arthropathy: No symptoms of radiculopathy at this time.   Pes cavus: She is wearing proper fitting shoes. She is not experiencing any increased discomfort in her feet at this time.   Age-related osteoporosis without current pathological fracture: DEXA updated on 01/08/22: BMD as determined from [facility] L1-L2 is 0.777 g/cm2 with a T-Score of -3.2. Previous DEXA 03/19/2019 BMD as determined from [facility] L1-L2 is 0.763 g/cm2 with a T-Scoreof -3.3.  Fosamax May 2021-advised to discontinue fosamax by her PCP.  She took Fosamax for a total of 3 years. Patient  received first Prolia injection on 07/05/2022.  She is scheduled for her second Prolia injection on 01/06/2023. CBC, CMP, and vitamin D checked today.   Vitamin D deficiency -Vitamin D will be rechecked today prior to her next Prolia injection scheduled on 01/06/2023.  Plan: VITAMIN D 25 Hydroxy (Vit-D Deficiency, Fractures)  Other medical conditions are listed as follows:  Essential hypertension: Blood pressure was 127/80 today in the office.  Dyslipidemia  History of IBS  Seasonal allergies    Orders: Orders Placed This Encounter  Procedures   Large Joint Inj   CBC with Differential/Platelet   COMPLETE METABOLIC PANEL WITH GFR   VITAMIN D 25 Hydroxy (Vit-D Deficiency, Fractures)   Meds ordered this encounter  Medications   hydroxychloroquine (PLAQUENIL) 200 MG tablet    Sig: Take 1 tablet (200 mg total) by mouth 2 (two) times daily.    Dispense:  180 tablet    Refill:  0    Follow-Up Instructions: Return in about 5 months (around 05/09/2023) for Rheumatoid arthritis.   Gearldine Bienenstock, PA-C  Note - This record has been created using Dragon software.  Chart creation errors have been sought, but may not always  have been located. Such creation errors do not reflect on  the standard of medical care.

## 2022-11-26 ENCOUNTER — Telehealth: Payer: Self-pay | Admitting: Orthopedic Surgery

## 2022-11-26 NOTE — Telephone Encounter (Signed)
Faxed 11/17/22 ov note & Op note to [facility] [phone]

## 2022-11-29 ENCOUNTER — Ambulatory Visit (HOSPITAL_COMMUNITY): Payer: 59 | Attending: Orthopedic Surgery | Admitting: Occupational Therapy

## 2022-11-29 ENCOUNTER — Other Ambulatory Visit: Payer: Self-pay

## 2022-11-29 ENCOUNTER — Encounter (HOSPITAL_COMMUNITY): Payer: Self-pay | Admitting: Occupational Therapy

## 2022-11-29 DIAGNOSIS — M25611 Stiffness of right shoulder, not elsewhere classified: Secondary | ICD-10-CM | POA: Insufficient documentation

## 2022-11-29 DIAGNOSIS — R29898 Other symptoms and signs involving the musculoskeletal system: Secondary | ICD-10-CM | POA: Diagnosis present

## 2022-11-29 DIAGNOSIS — M25511 Pain in right shoulder: Secondary | ICD-10-CM | POA: Diagnosis present

## 2022-11-29 DIAGNOSIS — Z96611 Presence of right artificial shoulder joint: Secondary | ICD-10-CM | POA: Diagnosis not present

## 2022-11-29 NOTE — Therapy (Signed)
OUTPATIENT OCCUPATIONAL THERAPY ORTHO EVALUATION  Patient Name: Tammy Jensen MRN: [id number] DOB:07-18-1950, 72 y.o., female Today's Date: 11/29/2022    END OF SESSION:  OT End of Session - 11/29/22 1515     Visit Number 1    Number of Visits 12    Date for OT Re-Evaluation 01/10/23    Authorization Type [employer] Medicare Dual Complete    Authorization Time Period no auth required    Progress Note Due on Visit 10    OT Start Time 1432    OT Stop Time 1512    OT Time Calculation (min) 40 min    Activity Tolerance Patient tolerated treatment well    Behavior During Therapy WFL for tasks assessed/performed             Past Medical History:  Diagnosis Date   Dysrhythmia    High cholesterol    Hypertension    IBS (irritable bowel syndrome)    Rheumatoid arthritis (HCC)    Tachycardia    Past Surgical History:  Procedure Laterality Date   BIOPSY  04/14/2022   Procedure: BIOPSY;  Surgeon: Dolores Frame, MD;  [facility];  Service: Gastroenterology;;   COLONOSCOPY N/A 10/07/2016   Procedure: COLONOSCOPY;  Surgeon: Malissa Hippo, MD;  [facility];  Service: Endoscopy;  Laterality: N/A;  730   COLONOSCOPY WITH PROPOFOL N/A 04/14/2022   Procedure: COLONOSCOPY WITH PROPOFOL;  Surgeon: Dolores Frame, MD;  [facility];  Service: Gastroenterology;  Laterality: N/A;  915am, asa 2   POLYPECTOMY  04/14/2022   Procedure: POLYPECTOMY;  Surgeon: Dolores Frame, MD;  [facility];  Service: Gastroenterology;;   RECONSTRUCTION OF EYELID  09/02/2021   REVERSE SHOULDER ARTHROPLASTY Right 11/01/2022   Procedure: RIGHT REVERSE SHOULDER ARTHROPLASTY;  Surgeon: Oliver Barre, MD;  Location: [facility];  Service: Orthopedics;  Laterality: Right;  RNFA NEEDED   TUBAL LIGATION     VAGINAL HYSTERECTOMY N/A 10/07/2021   Procedure: HYSTERECTOMY VAGINAL;  Surgeon: Lazaro Arms, MD;  Location: [facility];  Service:  Gynecology;  Laterality: N/A;   Patient Active Problem List   Diagnosis Date Noted   Rotator cuff arthropathy of right shoulder 11/01/2022   Constipation 03/11/2022   Liver hemangioma 03/11/2022   Mixed hyperlipidemia 01/29/2021   Rheumatoid arteritis (HCC) 06/26/2020   Pernicious anemia 06/26/2020   Mild valvular heart disease 06/26/2020   Rheumatoid arthritis with rheumatoid factor of multiple sites without organ or systems involvement (HCC) 09/06/2019   High risk medication use 09/06/2019   Elevated LFTs 09/06/2019   Age-related osteoporosis without current pathological fracture 09/06/2019   Dyslipidemia 08/14/2019   Abnormal electrocardiogram 03/12/2019   Chest pain 03/12/2019   HTN (hypertension) 03/12/2019   Obesity 03/12/2019   Family history of colon cancer 05/28/2016   PCP: Dr. Orpah Cobb REFERRING PROVIDER: Dr. Thane Edu  ONSET DATE: 11/01/22  REFERRING DIAG: W09.811 (ICD-10-CM) - Status post reverse arthroplasty of right shoulder   THERAPY DIAG:  Acute pain of right shoulder  Stiffness of right shoulder, not elsewhere classified  Other symptoms and signs involving the musculoskeletal system  Rationale for Evaluation and Treatment: Rehabilitation  SUBJECTIVE:   SUBJECTIVE STATEMENT: S: I'm ready to get this started.  Pt accompanied by: self  PERTINENT HISTORY: Pt is a 72 y/o female s/p right reverse TSA on 11/01/2022. Pt presents in sling without abduction pillow. Has been completing HEP given by MD.   PRECAUTIONS: Shoulder See protocol.  WEIGHT BEARING RESTRICTIONS: Yes NWB  PAIN:  Are you having pain? Yes: NPRS scale: 4/10 Pain location: medial upper arm Pain description: sore Aggravating factors: using a lot Relieving factors: rest, Tylenol   FALLS: Has patient fallen in last 6 months? No  PLOF: Independent  PATIENT GOALS: To be able to use her arm.   NEXT MD VISIT: 12/15/22  OBJECTIVE:  Note: Objective measures were completed at  Evaluation unless otherwise noted.  HAND DOMINANCE: Left  ADLs: Overall ADLs: Pt is having difficulty with dressing, bathing, reaching up to wash and fix her hair. Pt is unable to reach overhead or behind back. Pt has difficulty with lifting tasks such as meal preparation and housework. Pt is sleeping in a lift chair, has tried the bed a couple of nights but is more sore in the mornings.    FUNCTIONAL OUTCOME MEASURES: FOTO: 51/100  UPPER EXTREMITY ROM:     Passive ROM Right eval  Shoulder flexion 120  Shoulder abduction 121  Shoulder internal rotation 90  Shoulder external rotation 20  (Blank rows = not tested)  Active ROM Right eval  Shoulder flexion 83  Shoulder abduction 88  Shoulder internal rotation 90  Shoulder external rotation 20  (Blank rows = not tested)   UPPER EXTREMITY MMT:     MMT Right eval  Shoulder flexion 3-/5  Shoulder abduction 3-/5  Shoulder internal rotation 3/5  Shoulder external rotation 3-/5  (Blank rows = not tested)   EDEMA: occasional ice pack use for swelling  COGNITION: Overall cognitive status: Within functional limits for tasks assessed  OBSERVATIONS: moderate fascial restrictions in right upper arm, anterior shoulder, trapezius, and scapular regions   TODAY'S TREATMENT:                                                                                                                              DATE:  Eval 11/29/22:  -P/ROM: supine-flexion, abduction, er, horizontal abduction, 5 reps  -Scapular A/ROM: row, elevation/depression, 10 reps -Table slides: flexion, abduction, er, 10 reps -Myofascial release to right upper arm, anterior shoulder, and trapezius to decrease pain and fascial restrictions and increase joint ROM   PATIENT EDUCATION: Education details: table slides; scapular A/ROM Person educated: Patient Education method: Programmer, multimedia, Demonstration, and Handouts Education comprehension: verbalized understanding and  returned demonstration  HOME EXERCISE PROGRAM: Eval:  table slides; scapular A/ROM  GOALS: Goals reviewed with patient? Yes  SHORT TERM GOALS: Target date: 12/20/22  Pt will be provided with and educated on HEP to improve mobility in RUE required for use during ADL completion.   Goal status: INITIAL  2.  Pt will increase RUE P/ROM by 10+ degrees to improve ability to use RUE during dressing tasks with minimal compensatory techniques.   Goal status: INITIAL   LONG TERM GOALS: Target date: 01/10/23  Pt will decrease pain in RUE to 3/10 or less to improve ability to sleep for 2+ consecutive hours without waking due  to pain.   Goal status: INITIAL  2.  Pt will decrease RUE fascial restrictions to min amounts or less to improve mobility required for functional reaching tasks.   Goal status: INITIAL  3.  Pt will increase RUE A/ROM by 40+ degrees to improve ability to use RUE when reaching overhead or behind back during dressing and bathing tasks.   Goal status: INITIAL  4.  Pt will increase RUE strength to 4+/5 or greater to improve ability to use RUE when lifting or carrying items during meal preparation/housework/yardwork tasks.   Goal status: INITIAL   ASSESSMENT:  CLINICAL IMPRESSION: Patient is a 72 y.o. female who was seen today for occupational therapy evaluation s/p right reverse TSA on 11/01/22. Pt reports she has been completing HEP from MD and trying to use her arm a little bit. Pt demonstrating good joint mobility, decreased ROM, strength, and functional use of RUE compared to baseline. Pt with low pain today, increases with use.    PERFORMANCE DEFICITS: in functional skills including ADLs, IADLs, edema, ROM, strength, pain, fascial restrictions, and UE functional use  IMPAIRMENTS: are limiting patient from ADLs, IADLs, rest and sleep, and leisure.   COMORBIDITIES: has no other co-morbidities that affects occupational performance. Patient will benefit from skilled  OT to address above impairments and improve overall function.  MODIFICATION OR ASSISTANCE TO COMPLETE EVALUATION: No modification of tasks or assist necessary to complete an evaluation.  OT OCCUPATIONAL PROFILE AND HISTORY: Problem focused assessment: Including review of records relating to presenting problem.  CLINICAL DECISION MAKING: LOW - limited treatment options, no task modification necessary  REHAB POTENTIAL: Good  EVALUATION COMPLEXITY: Low      PLAN:  OT FREQUENCY: 2x/week  OT DURATION: 6 weeks  PLANNED INTERVENTIONS: 97168 OT Re-evaluation, 97535 self care/ADL training, 16109 therapeutic exercise, 97530 therapeutic activity, 97140 manual therapy, 97035 ultrasound, 97010 moist heat, 97010 cryotherapy, 97014 electrical stimulation unattended, patient/family education, and DME and/or AE instructions  RECOMMENDED OTHER SERVICES: None   CONSULTED AND AGREED WITH PLAN OF CARE: Patient  PLAN FOR NEXT SESSION: Follow up on HEP, progress to phase II of protocol with AA/ROM and update HEP when appropriate   Ezra Sites, OTR/L  [phone] 11/29/2022, 3:16 PM

## 2022-11-29 NOTE — Patient Instructions (Signed)
1) SHOULDER: Flexion On Table   Place hands on towel placed on table, elbows straight. Lean forward with you upper body, pushing towel away from body.  10 reps per set, 2-3 sets per day  2) Abduction (Passive)   With arm out to side, resting on towel placed on table with palm DOWN, keeping trunk away from table, lean to the side while pushing towel away from body.  Repeat 10 times. Do 2-3 sessions per day.  Copyright  VHI. All rights reserved.     3) Internal Rotation (Assistive)   Seated with elbow bent at right angle and held against side, slide arm on table surface in an inward arc keeping elbow anchored in place. Repeat 10 times. Do 2-3 sessions per day. Activity: Use this motion to brush crumbs off the table.  Copyright  VHI. All rights reserved.   4) Seated Row   Sit up straight with elbows by your sides. Pull back with shoulders/elbows, keeping forearms straight, as if pulling back on the reins of a horse. Squeeze shoulder blades together. Repeat _10-15__times, __2-3__sets/day    5) Shoulder Elevation    Sit up straight with arms by your sides. Slowly bring your shoulders up towards your ears. Repeat_10-15__times, __2-3__ sets/day

## 2022-12-08 ENCOUNTER — Ambulatory Visit (HOSPITAL_COMMUNITY): Payer: 59 | Attending: Orthopedic Surgery | Admitting: Occupational Therapy

## 2022-12-08 ENCOUNTER — Encounter (HOSPITAL_COMMUNITY): Payer: Self-pay | Admitting: Occupational Therapy

## 2022-12-08 DIAGNOSIS — M7061 Trochanteric bursitis, right hip: Secondary | ICD-10-CM | POA: Insufficient documentation

## 2022-12-08 DIAGNOSIS — M25511 Pain in right shoulder: Secondary | ICD-10-CM | POA: Diagnosis present

## 2022-12-08 DIAGNOSIS — G8929 Other chronic pain: Secondary | ICD-10-CM | POA: Diagnosis present

## 2022-12-08 DIAGNOSIS — Z96611 Presence of right artificial shoulder joint: Secondary | ICD-10-CM | POA: Insufficient documentation

## 2022-12-08 DIAGNOSIS — E559 Vitamin D deficiency, unspecified: Secondary | ICD-10-CM | POA: Insufficient documentation

## 2022-12-08 DIAGNOSIS — M0579 Rheumatoid arthritis with rheumatoid factor of multiple sites without organ or systems involvement: Secondary | ICD-10-CM | POA: Insufficient documentation

## 2022-12-08 DIAGNOSIS — R29898 Other symptoms and signs involving the musculoskeletal system: Secondary | ICD-10-CM | POA: Diagnosis present

## 2022-12-08 DIAGNOSIS — I10 Essential (primary) hypertension: Secondary | ICD-10-CM | POA: Diagnosis present

## 2022-12-08 DIAGNOSIS — M25611 Stiffness of right shoulder, not elsewhere classified: Secondary | ICD-10-CM | POA: Diagnosis present

## 2022-12-08 DIAGNOSIS — M810 Age-related osteoporosis without current pathological fracture: Secondary | ICD-10-CM | POA: Insufficient documentation

## 2022-12-08 DIAGNOSIS — J302 Other seasonal allergic rhinitis: Secondary | ICD-10-CM | POA: Diagnosis present

## 2022-12-08 DIAGNOSIS — Q667 Congenital pes cavus, unspecified foot: Secondary | ICD-10-CM | POA: Diagnosis present

## 2022-12-08 DIAGNOSIS — E785 Hyperlipidemia, unspecified: Secondary | ICD-10-CM | POA: Diagnosis present

## 2022-12-08 DIAGNOSIS — Z79899 Other long term (current) drug therapy: Secondary | ICD-10-CM | POA: Diagnosis present

## 2022-12-08 DIAGNOSIS — M7062 Trochanteric bursitis, left hip: Secondary | ICD-10-CM | POA: Insufficient documentation

## 2022-12-08 DIAGNOSIS — M47816 Spondylosis without myelopathy or radiculopathy, lumbar region: Secondary | ICD-10-CM | POA: Insufficient documentation

## 2022-12-08 DIAGNOSIS — M25512 Pain in left shoulder: Secondary | ICD-10-CM | POA: Insufficient documentation

## 2022-12-08 DIAGNOSIS — Z8719 Personal history of other diseases of the digestive system: Secondary | ICD-10-CM | POA: Diagnosis present

## 2022-12-08 NOTE — Patient Instructions (Signed)

## 2022-12-08 NOTE — Therapy (Signed)
OUTPATIENT OCCUPATIONAL THERAPY ORTHO TREATMENT  Patient Name: Tammy Jensen MRN: [id number] DOB:Oct 30, 1950, 72 y.o., female Today's Date: 12/08/2022    END OF SESSION:  OT End of Session - 12/08/22 1100     Visit Number 2    Number of Visits 12    Date for OT Re-Evaluation 01/10/23    Authorization Type [employer] Medicare Dual Complete    Authorization Time Period no auth required    Progress Note Due on Visit 10    OT Start Time 1014    OT Stop Time 1057    OT Time Calculation (min) 43 min    Activity Tolerance Patient tolerated treatment well    Behavior During Therapy WFL for tasks assessed/performed              Past Medical History:  Diagnosis Date   Dysrhythmia    High cholesterol    Hypertension    IBS (irritable bowel syndrome)    Rheumatoid arthritis (HCC)    Tachycardia    Past Surgical History:  Procedure Laterality Date   BIOPSY  04/14/2022   Procedure: BIOPSY;  Surgeon: Dolores Frame, MD;  Location: [facility];  Service: Gastroenterology;;   COLONOSCOPY N/A 10/07/2016   Procedure: COLONOSCOPY;  Surgeon: Malissa Hippo, MD;  Location: [facility];  Service: Endoscopy;  Laterality: N/A;  730   COLONOSCOPY WITH PROPOFOL N/A 04/14/2022   Procedure: COLONOSCOPY WITH PROPOFOL;  Surgeon: Dolores Frame, MD;  Location: [facility];  Service: Gastroenterology;  Laterality: N/A;  915am, asa 2   POLYPECTOMY  04/14/2022   Procedure: POLYPECTOMY;  Surgeon: Dolores Frame, MD;  Location: [facility];  Service: Gastroenterology;;   RECONSTRUCTION OF EYELID  09/02/2021   REVERSE SHOULDER ARTHROPLASTY Right 11/01/2022   Procedure: RIGHT REVERSE SHOULDER ARTHROPLASTY;  Surgeon: Oliver Barre, MD;  Location: [facility];  Service: Orthopedics;  Laterality: Right;  RNFA NEEDED   TUBAL LIGATION     VAGINAL HYSTERECTOMY N/A 10/07/2021   Procedure: HYSTERECTOMY VAGINAL;  Surgeon: Lazaro Arms, MD;  Location: [facility];  Service:  Gynecology;  Laterality: N/A;   Patient Active Problem List   Diagnosis Date Noted   Rotator cuff arthropathy of right shoulder 11/01/2022   Constipation 03/11/2022   Liver hemangioma 03/11/2022   Mixed hyperlipidemia 01/29/2021   Rheumatoid arteritis (HCC) 06/26/2020   Pernicious anemia 06/26/2020   Mild valvular heart disease 06/26/2020   Rheumatoid arthritis with rheumatoid factor of multiple sites without organ or systems involvement (HCC) 09/06/2019   High risk medication use 09/06/2019   Elevated LFTs 09/06/2019   Age-related osteoporosis without current pathological fracture 09/06/2019   Dyslipidemia 08/14/2019   Abnormal electrocardiogram 03/12/2019   Chest pain 03/12/2019   HTN (hypertension) 03/12/2019   Obesity 03/12/2019   Family history of colon cancer 05/28/2016   PCP: Dr. Orpah Cobb REFERRING PROVIDER: Dr. Thane Edu  ONSET DATE: 11/01/22  REFERRING DIAG: Y40.347 (ICD-10-CM) - Status post reverse arthroplasty of right shoulder   THERAPY DIAG:  Acute pain of right shoulder  Stiffness of right shoulder, not elsewhere classified  Other symptoms and signs involving the musculoskeletal system  Rationale for Evaluation and Treatment: Rehabilitation  SUBJECTIVE:   SUBJECTIVE STATEMENT: S: It's been hurting pretty bad this morning down my arm.    PERTINENT HISTORY: Pt is a 72 y/o female s/p right reverse TSA on 11/01/2022. Pt presents in sling without abduction pillow. Has been completing HEP given by MD.   PRECAUTIONS: Shoulder See  protocol.   WEIGHT BEARING RESTRICTIONS: Yes NWB  PAIN:  Are you having pain? Yes: NPRS scale: 6/10 Pain location: medial upper arm Pain description: sore Aggravating factors: not sure Relieving factors: rest, Tylenol   FALLS: Has patient fallen in last 6 months? No  PLOF: Independent  PATIENT GOALS: To be able to use her arm.   NEXT MD VISIT: 12/15/22  OBJECTIVE:  Note: Objective measures were completed at  Evaluation unless otherwise noted.  HAND DOMINANCE: Left  ADLs: Overall ADLs: Pt is having difficulty with dressing, bathing, reaching up to wash and fix her hair. Pt is unable to reach overhead or behind back. Pt has difficulty with lifting tasks such as meal preparation and housework. Pt is sleeping in a lift chair, has tried the bed a couple of nights but is more sore in the mornings.    FUNCTIONAL OUTCOME MEASURES: FOTO: 51/100  UPPER EXTREMITY ROM:     Passive ROM Right eval  Shoulder flexion 120  Shoulder abduction 121  Shoulder internal rotation 90  Shoulder external rotation 20  (Blank rows = not tested)  Active ROM Right eval  Shoulder flexion 83  Shoulder abduction 88  Shoulder internal rotation 90  Shoulder external rotation 20  (Blank rows = not tested)   UPPER EXTREMITY MMT:     MMT Right eval  Shoulder flexion 3-/5  Shoulder abduction 3-/5  Shoulder internal rotation 3/5  Shoulder external rotation 3-/5  (Blank rows = not tested)   EDEMA: occasional ice pack use for swelling  OBSERVATIONS: moderate fascial restrictions in right upper arm, anterior shoulder, trapezius, and scapular regions   TODAY'S TREATMENT:                                                                                                                              DATE: 12/08/22 -myofascial release to right upper arm, anterior shoulder, trapezius, and scapular regions to decrease pain and fascial restrictions and increase joint ROM.  -P/ROM: supine-flexion, abduction, er, horizontal abduction, 5 reps  -AA/ROM: supine-protraction, flexion, horizontal abduction, er, abduction, 10 reps -AA/ROM: standing-protraction, flexion, horizontal abduction, er, abduction, 10 reps -Wall wash: 1' flexion -Thumb tacks: 1' 90 degrees flexion -Isometrics: 2x10" holds-flexion, extension, abduction, adduction, er, IR -Pulleys: 1' flexion, 1' abduction  Eval 11/29/22:  -P/ROM: supine-flexion,  abduction, er, horizontal abduction, 5 reps  -Scapular A/ROM: row, elevation/depression, 10 reps -Table slides: flexion, abduction, er, 10 reps -Myofascial release to right upper arm, anterior shoulder, and trapezius to decrease pain and fascial restrictions and increase joint ROM   PATIENT EDUCATION: Education details: AA/ROM Person educated: Patient Education method: Programmer, multimedia, Facilities manager, and Handouts Education comprehension: verbalized understanding and returned demonstration  HOME EXERCISE PROGRAM: Eval:  table slides; scapular A/ROM 12/4: AA/ROM  GOALS: Goals reviewed with patient? Yes  SHORT TERM GOALS: Target date: 12/20/22  Pt will be provided with and educated on HEP to improve mobility in RUE required for use during ADL completion.  Goal status: IN PROGRESS  2.  Pt will increase RUE P/ROM by 10+ degrees to improve ability to use RUE during dressing tasks with minimal compensatory techniques.   Goal status: IN PROGRESS   LONG TERM GOALS: Target date: 01/10/23  Pt will decrease pain in RUE to 3/10 or less to improve ability to sleep for 2+ consecutive hours without waking due to pain.   Goal status: IN PROGRESS  2.  Pt will decrease RUE fascial restrictions to min amounts or less to improve mobility required for functional reaching tasks.   Goal status: IN PROGRESS  3.  Pt will increase RUE A/ROM by 40+ degrees to improve ability to use RUE when reaching overhead or behind back during dressing and bathing tasks.   Goal status: IN PROGRESS  4.  Pt will increase RUE strength to 4+/5 or greater to improve ability to use RUE when lifting or carrying items during meal preparation/housework/yardwork tasks.   Goal status: IN PROGRESS   ASSESSMENT:  CLINICAL IMPRESSION: Pt reports she is completing her HEP 1-2x/day. She is having increased pain down the medial side of her arm at and below the deltoid. Mild sharp pain with er during passive stretching, position  adjusted with improvement. Initiated myofascial release and AA/ROM today. Also completing isometrics and pulleys. Pt achieving ROM to approximately 65%, updated HEP for AA/ROM. Verbal cuing for form and technique.   PERFORMANCE DEFICITS: in functional skills including ADLs, IADLs, edema, ROM, strength, pain, fascial restrictions, and UE functional use     PLAN:  OT FREQUENCY: 2x/week  OT DURATION: 6 weeks  PLANNED INTERVENTIONS: 97168 OT Re-evaluation, 97535 self care/ADL training, 82956 therapeutic exercise, 97530 therapeutic activity, 97140 manual therapy, 97035 ultrasound, 97010 moist heat, 97010 cryotherapy, 97014 electrical stimulation unattended, patient/family education, and DME and/or AE instructions  CONSULTED AND AGREED WITH PLAN OF CARE: Patient  PLAN FOR NEXT SESSION: Follow up on HEP, continue with AA/ROM and add scapular theraband   Ezra Sites, OTR/L  [phone] 12/08/2022, 11:01 AM

## 2022-12-09 ENCOUNTER — Ambulatory Visit (INDEPENDENT_AMBULATORY_CARE_PROVIDER_SITE_OTHER): Payer: 59 | Admitting: Physician Assistant

## 2022-12-09 ENCOUNTER — Encounter: Payer: Self-pay | Admitting: Physician Assistant

## 2022-12-09 VITALS — BP 127/80 | HR 69 | Resp 14 | Ht 62.0 in | Wt 160.0 lb

## 2022-12-09 DIAGNOSIS — M7061 Trochanteric bursitis, right hip: Secondary | ICD-10-CM | POA: Diagnosis not present

## 2022-12-09 DIAGNOSIS — M25512 Pain in left shoulder: Secondary | ICD-10-CM

## 2022-12-09 DIAGNOSIS — M0579 Rheumatoid arthritis with rheumatoid factor of multiple sites without organ or systems involvement: Secondary | ICD-10-CM

## 2022-12-09 DIAGNOSIS — J302 Other seasonal allergic rhinitis: Secondary | ICD-10-CM

## 2022-12-09 DIAGNOSIS — Z79899 Other long term (current) drug therapy: Secondary | ICD-10-CM | POA: Diagnosis not present

## 2022-12-09 DIAGNOSIS — Z96611 Presence of right artificial shoulder joint: Secondary | ICD-10-CM | POA: Diagnosis not present

## 2022-12-09 DIAGNOSIS — M7062 Trochanteric bursitis, left hip: Secondary | ICD-10-CM

## 2022-12-09 DIAGNOSIS — Z8719 Personal history of other diseases of the digestive system: Secondary | ICD-10-CM

## 2022-12-09 DIAGNOSIS — M47816 Spondylosis without myelopathy or radiculopathy, lumbar region: Secondary | ICD-10-CM

## 2022-12-09 DIAGNOSIS — E785 Hyperlipidemia, unspecified: Secondary | ICD-10-CM

## 2022-12-09 DIAGNOSIS — Q667 Congenital pes cavus, unspecified foot: Secondary | ICD-10-CM

## 2022-12-09 DIAGNOSIS — I10 Essential (primary) hypertension: Secondary | ICD-10-CM

## 2022-12-09 DIAGNOSIS — E559 Vitamin D deficiency, unspecified: Secondary | ICD-10-CM

## 2022-12-09 DIAGNOSIS — M810 Age-related osteoporosis without current pathological fracture: Secondary | ICD-10-CM

## 2022-12-09 DIAGNOSIS — G8929 Other chronic pain: Secondary | ICD-10-CM

## 2022-12-09 DIAGNOSIS — M25511 Pain in right shoulder: Secondary | ICD-10-CM

## 2022-12-09 MED ORDER — LIDOCAINE HCL 1 % IJ SOLN
1.5000 mL | INTRAMUSCULAR | Status: AC | PRN
  Administered 2022-12-09: 1.5 mL

## 2022-12-09 MED ORDER — HYDROXYCHLOROQUINE SULFATE 200 MG PO TABS: 200.0000 mg | ORAL_TABLET | Freq: Two times a day (BID) | ORAL | 0 refills | Status: DC

## 2022-12-09 MED ORDER — TRIAMCINOLONE ACETONIDE 40 MG/ML IJ SUSP
40.0000 mg | INTRAMUSCULAR | Status: AC | PRN
  Administered 2022-12-09: 40 mg via INTRA_ARTICULAR

## 2022-12-10 ENCOUNTER — Ambulatory Visit (HOSPITAL_COMMUNITY): Payer: 59 | Admitting: Occupational Therapy

## 2022-12-10 ENCOUNTER — Encounter (HOSPITAL_COMMUNITY): Payer: 59 | Admitting: Occupational Therapy

## 2022-12-10 ENCOUNTER — Encounter (HOSPITAL_COMMUNITY): Payer: Self-pay | Admitting: Occupational Therapy

## 2022-12-10 DIAGNOSIS — M25611 Stiffness of right shoulder, not elsewhere classified: Secondary | ICD-10-CM

## 2022-12-10 DIAGNOSIS — M25511 Pain in right shoulder: Secondary | ICD-10-CM

## 2022-12-10 DIAGNOSIS — R29898 Other symptoms and signs involving the musculoskeletal system: Secondary | ICD-10-CM

## 2022-12-10 LAB — COMPLETE METABOLIC PANEL WITH GFR
AG Ratio: 1.6 (calc) (ref 1.0–2.5)
ALT: 16 U/L (ref 6–29)
AST: 28 U/L (ref 10–35)
Albumin: 4.2 g/dL (ref 3.6–5.1)
Alkaline phosphatase (APISO): 56 U/L (ref 37–153)
BUN: 14 mg/dL (ref 7–25)
CO2: 23 mmol/L (ref 20–32)
Calcium: 9.4 mg/dL (ref 8.6–10.4)
Chloride: 108 mmol/L (ref 98–110)
Creat: 0.68 mg/dL (ref 0.60–1.00)
Globulin: 2.6 g/dL (ref 1.9–3.7)
Glucose, Bld: 97 mg/dL (ref 65–99)
Potassium: 4.3 mmol/L (ref 3.5–5.3)
Sodium: 141 mmol/L (ref 135–146)
Total Bilirubin: 0.7 mg/dL (ref 0.2–1.2)
Total Protein: 6.8 g/dL (ref 6.1–8.1)
eGFR: 92 mL/min/{1.73_m2} (ref >=60)

## 2022-12-10 LAB — CBC WITH DIFFERENTIAL/PLATELET
Absolute Lymphocytes: 1679 {cells}/uL (ref 850–3900)
Absolute Monocytes: 410 {cells}/uL (ref 200–950)
Basophils Absolute: 70 {cells}/uL (ref 0–200)
Basophils Relative: 1.3 %
Eosinophils Absolute: 119 {cells}/uL (ref 15–500)
Eosinophils Relative: 2.2 %
HCT: 39.1 % (ref 35.0–45.0)
Hemoglobin: 12.6 g/dL (ref 11.7–15.5)
MCH: 28.6 pg (ref 27.0–33.0)
MCHC: 32.2 g/dL (ref 32.0–36.0)
MCV: 88.9 fL (ref 80.0–100.0)
MPV: 10.8 fL (ref 7.5–12.5)
Monocytes Relative: 7.6 %
Neutro Abs: 3121 {cells}/uL (ref 1500–7800)
Neutrophils Relative %: 57.8 %
Platelets: 235 10*3/uL (ref 140–400)
RBC: 4.4 10*6/uL (ref 3.80–5.10)
RDW: 12.7 % (ref 11.0–15.0)
Total Lymphocyte: 31.1 %
WBC: 5.4 10*3/uL (ref 3.8–10.8)

## 2022-12-10 LAB — VITAMIN D 25 HYDROXY (VIT D DEFICIENCY, FRACTURES): Vit D, 25-Hydroxy: 30 ng/mL (ref 30–100)

## 2022-12-10 NOTE — Progress Notes (Signed)
CBC and CMP WNL. Vitamin D WNL

## 2022-12-10 NOTE — Therapy (Signed)
OUTPATIENT OCCUPATIONAL THERAPY ORTHO TREATMENT  Patient Name: Tammy Jensen MRN: [id number] DOB:1950/10/18, 72 y.o., female Today's Date: 12/10/2022    END OF SESSION:  OT End of Session - 12/10/22 1401     Visit Number 3    Number of Visits 12    Date for OT Re-Evaluation 01/10/23    Authorization Type [employer] Medicare Dual Complete    Authorization Time Period no auth required    Progress Note Due on Visit 10    OT Start Time 1306    OT Stop Time 1345    OT Time Calculation (min) 39 min    Activity Tolerance Patient tolerated treatment well    Behavior During Therapy WFL for tasks assessed/performed               Past Medical History:  Diagnosis Date   Dysrhythmia    High cholesterol    Hypertension    IBS (irritable bowel syndrome)    Rheumatoid arthritis (HCC)    Tachycardia    Past Surgical History:  Procedure Laterality Date   BIOPSY  04/14/2022   Procedure: BIOPSY;  Surgeon: Dolores Frame, MD;  Location: [facility];  Service: Gastroenterology;;   COLONOSCOPY N/A 10/07/2016   Procedure: COLONOSCOPY;  Surgeon: Malissa Hippo, MD;  Location: [facility];  Service: Endoscopy;  Laterality: N/A;  730   COLONOSCOPY WITH PROPOFOL N/A 04/14/2022   Procedure: COLONOSCOPY WITH PROPOFOL;  Surgeon: Dolores Frame, MD;  [facility]: [facility];  Service: Gastroenterology;  Laterality: N/A;  915am, asa 2   POLYPECTOMY  04/14/2022   Procedure: POLYPECTOMY;  Surgeon: Dolores Frame, MD;  [facility]: [facility];  Service: Gastroenterology;;   RECONSTRUCTION OF EYELID  09/02/2021   REVERSE SHOULDER ARTHROPLASTY Right 11/01/2022   Procedure: RIGHT REVERSE SHOULDER ARTHROPLASTY;  Surgeon: Oliver Barre, MD;  Location: [facility];  Service: Orthopedics;  Laterality: Right;  RNFA NEEDED   TUBAL LIGATION     VAGINAL HYSTERECTOMY N/A 10/07/2021   Procedure: HYSTERECTOMY VAGINAL;  Surgeon: Lazaro Arms, MD;  Location: [facility];  Service:  Gynecology;  Laterality: N/A;   Patient Active Problem List   Diagnosis Date Noted   Rotator cuff arthropathy of right shoulder 11/01/2022   Constipation 03/11/2022   Liver hemangioma 03/11/2022   Mixed hyperlipidemia 01/29/2021   Rheumatoid arteritis (HCC) 06/26/2020   Pernicious anemia 06/26/2020   Mild valvular heart disease 06/26/2020   Rheumatoid arthritis with rheumatoid factor of multiple sites without organ or systems involvement (HCC) 09/06/2019   High risk medication use 09/06/2019   Elevated LFTs 09/06/2019   Age-related osteoporosis without current pathological fracture 09/06/2019   Dyslipidemia 08/14/2019   Abnormal electrocardiogram 03/12/2019   Chest pain 03/12/2019   HTN (hypertension) 03/12/2019   Obesity 03/12/2019   Family history of colon cancer 05/28/2016   PCP: Dr. Orpah Cobb REFERRING PROVIDER: Dr. Thane Edu  ONSET DATE: 11/01/22  REFERRING DIAG: Q03.474 (ICD-10-CM) - Status post reverse arthroplasty of right shoulder   THERAPY DIAG:  Acute pain of right shoulder  Stiffness of right shoulder, not elsewhere classified  Other symptoms and signs involving the musculoskeletal system  Rationale for Evaluation and Treatment: Rehabilitation  SUBJECTIVE:   SUBJECTIVE STATEMENT: S: It's really been hurting.    PERTINENT HISTORY: Pt is a 72 y/o female s/p right reverse TSA on 11/01/2022. Pt presents in sling without abduction pillow. Has been completing HEP given by MD.   PRECAUTIONS: Shoulder See protocol.   WEIGHT BEARING  RESTRICTIONS: Yes NWB  PAIN:  Are you having pain? Yes: NPRS scale: 7/10 Pain location: medial upper arm Pain description: aching when still, worsens with movement Aggravating factors: not sure Relieving factors: rest, Tylenol   FALLS: Has patient fallen in last 6 months? No  PLOF: Independent  PATIENT GOALS: To be able to use her arm.   NEXT MD VISIT: 12/15/22  OBJECTIVE:  Note: Objective measures were  completed at Evaluation unless otherwise noted.  HAND DOMINANCE: Left  ADLs: Overall ADLs: Pt is having difficulty with dressing, bathing, reaching up to wash and fix her hair. Pt is unable to reach overhead or behind back. Pt has difficulty with lifting tasks such as meal preparation and housework. Pt is sleeping in a lift chair, has tried the bed a couple of nights but is more sore in the mornings.    FUNCTIONAL OUTCOME MEASURES: FOTO: 51/100  UPPER EXTREMITY ROM:     Passive ROM Right eval  Shoulder flexion 120  Shoulder abduction 121  Shoulder internal rotation 90  Shoulder external rotation 20  (Blank rows = not tested)  Active ROM Right eval  Shoulder flexion 83  Shoulder abduction 88  Shoulder internal rotation 90  Shoulder external rotation 20  (Blank rows = not tested)   UPPER EXTREMITY MMT:     MMT Right eval  Shoulder flexion 3-/5  Shoulder abduction 3-/5  Shoulder internal rotation 3/5  Shoulder external rotation 3-/5  (Blank rows = not tested)   EDEMA: occasional ice pack use for swelling  OBSERVATIONS: moderate fascial restrictions in right upper arm, anterior shoulder, trapezius, and scapular regions   TODAY'S TREATMENT:                                                                                                                              DATE: 12/10/22 -myofascial release to right upper arm, anterior shoulder, trapezius, and scapular regions to decrease pain and fascial restrictions and increase joint ROM.  -P/ROM: supine-flexion, abduction, er, horizontal abduction, 5 reps  -AA/ROM: supine-protraction, flexion, horizontal abduction, er, abduction, 10 reps -Isometrics: standing-flexion, extension, abduction, adduction, er, IR, 3x10" holds -Wall wash: 1' flexion -AA/ROM: standing-protraction, flexion, horizontal abduction, er, abduction, 10 reps -Pulleys: 2' flexion, 2' abduction  12/08/22 -myofascial release to right upper arm, anterior  shoulder, trapezius, and scapular regions to decrease pain and fascial restrictions and increase joint ROM.  -P/ROM: supine-flexion, abduction, er, horizontal abduction, 5 reps  -AA/ROM: supine-protraction, flexion, horizontal abduction, er, abduction, 10 reps -AA/ROM: standing-protraction, flexion, horizontal abduction, er, abduction, 10 reps -Wall wash: 1' flexion -Thumb tacks: 1' 90 degrees flexion -Isometrics: 2x10" holds-flexion, extension, abduction, adduction, er, IR -Pulleys: 1' flexion, 1' abduction  Eval 11/29/22:  -P/ROM: supine-flexion, abduction, er, horizontal abduction, 5 reps  -Scapular A/ROM: row, elevation/depression, 10 reps -Table slides: flexion, abduction, er, 10 reps -Myofascial release to right upper arm, anterior shoulder, and trapezius to decrease pain and fascial restrictions and increase joint ROM  PATIENT EDUCATION: Education details: reviewed HEP Person educated: Patient Education method: Programmer, multimedia, Demonstration, and Handouts Education comprehension: verbalized understanding and returned demonstration  HOME EXERCISE PROGRAM: Eval:  table slides; scapular A/ROM 12/4: AA/ROM  GOALS: Goals reviewed with patient? Yes  SHORT TERM GOALS: Target date: 12/20/22  Pt will be provided with and educated on HEP to improve mobility in RUE required for use during ADL completion.   Goal status: IN PROGRESS  2.  Pt will increase RUE P/ROM by 10+ degrees to improve ability to use RUE during dressing tasks with minimal compensatory techniques.   Goal status: IN PROGRESS   LONG TERM GOALS: Target date: 01/10/23  Pt will decrease pain in RUE to 3/10 or less to improve ability to sleep for 2+ consecutive hours without waking due to pain.   Goal status: IN PROGRESS  2.  Pt will decrease RUE fascial restrictions to min amounts or less to improve mobility required for functional reaching tasks.   Goal status: IN PROGRESS  3.  Pt will increase RUE A/ROM by 40+  degrees to improve ability to use RUE when reaching overhead or behind back during dressing and bathing tasks.   Goal status: IN PROGRESS  4.  Pt will increase RUE strength to 4+/5 or greater to improve ability to use RUE when lifting or carrying items during meal preparation/housework/yardwork tasks.   Goal status: IN PROGRESS   ASSESSMENT:  CLINICAL IMPRESSION: Pt reports increased pain over the past few days. Continued with manual techniques and passive stretching, pt with pain during AA/ROM flexion, improved with joint distraction during movement. Added isometrics in standing, continued with wall wash but held off on thumb tacks due to increased pain and difficulty. AA/ROM in standing completed, increased pulleys to 2'.  Verbal cuing for form and technique.   PERFORMANCE DEFICITS: in functional skills including ADLs, IADLs, edema, ROM, strength, pain, fascial restrictions, and UE functional use     PLAN:  OT FREQUENCY: 2x/week  OT DURATION: 6 weeks  PLANNED INTERVENTIONS: 97168 OT Re-evaluation, 97535 self care/ADL training, 93235 therapeutic exercise, 97530 therapeutic activity, 97140 manual therapy, 97035 ultrasound, 97010 moist heat, 97010 cryotherapy, 97014 electrical stimulation unattended, patient/family education, and DME and/or AE instructions  CONSULTED AND AGREED WITH PLAN OF CARE: Patient  PLAN FOR NEXT SESSION: Follow up on HEP, continue with AA/ROM and add scapular theraband when appropriate   Ezra Sites, OTR/L  [phone] 12/10/2022, 2:02 PM

## 2022-12-13 ENCOUNTER — Encounter (HOSPITAL_COMMUNITY): Payer: 59 | Admitting: Occupational Therapy

## 2022-12-15 ENCOUNTER — Other Ambulatory Visit (INDEPENDENT_AMBULATORY_CARE_PROVIDER_SITE_OTHER): Payer: 59

## 2022-12-15 ENCOUNTER — Encounter: Payer: Self-pay | Admitting: Orthopedic Surgery

## 2022-12-15 ENCOUNTER — Encounter (HOSPITAL_COMMUNITY): Payer: 59 | Admitting: Occupational Therapy

## 2022-12-15 ENCOUNTER — Ambulatory Visit (INDEPENDENT_AMBULATORY_CARE_PROVIDER_SITE_OTHER): Payer: 59 | Admitting: Orthopedic Surgery

## 2022-12-15 DIAGNOSIS — Z96611 Presence of right artificial shoulder joint: Secondary | ICD-10-CM

## 2022-12-15 MED ORDER — CYCLOBENZAPRINE HCL 10 MG PO TABS: 10.0000 mg | ORAL_TABLET | Freq: Two times a day (BID) | ORAL | 0 refills | Status: DC | PRN

## 2022-12-15 MED ORDER — IBUPROFEN 600 MG PO TABS: 600.0000 mg | ORAL_TABLET | Freq: Three times a day (TID) | ORAL | 0 refills | Status: AC | PRN

## 2022-12-15 NOTE — Progress Notes (Signed)
Orthopaedic Postop Note  Assessment: Tammy Jensen is a 72 y.o. female s/p Right Reverse Shoulder Arthroplasty  DOS: 11/01/2022  Plan: Tammy Jensen is doing very well.  She has minimal pain.  She is requesting some ibuprofen.  She has some muscle spasm in the trapezius, and I have offered her Flexeril.  Continue to work with therapy.  Gradual progression from passive to active range of motion and then transition to strengthening.  If she has any further issues, she will contact the clinic.  I would like to see her back in 6 weeks.  Meds ordered this encounter  Medications   cyclobenzaprine (FLEXERIL) 10 MG tablet    Sig: Take 1 tablet (10 mg total) by mouth 2 (two) times daily as needed.    Dispense:  20 tablet    Refill:  0   ibuprofen (ADVIL) 600 MG tablet    Sig: Take 1 tablet (600 mg total) by mouth every 8 (eight) hours as needed.    Dispense:  30 tablet    Refill:  0     Follow-up: Return in about 6 weeks (around 01/26/2023).  XR at next visit: Right shoulder  Subjective:  Chief Complaint  Patient presents with   Routine Post Op    R RSA DOS: 11/01/2022    History of Present Illness: Tammy Jensen is a 72 y.o. female who presents following the above stated procedure.  Surgery was approximately 6 weeks ago.  She has been doing very well.  She states that she only took pain medication 3-4 times.  She has some spasm in the trapezius and shoulder area.  She is working well with therapy.  No issues with her incisions.   Review of Systems: No fevers or chills No numbness or tingling No Chest Pain No shortness of breath   Objective: There were no vitals taken for this visit.  Physical Exam:  Alert and oriented, no acute distress  Surgical incision is healing well, no surrounding erythema or drainage Sensation intact in the axillary nerve distribution Active motion intact in the hand 2+ radial pulse Passive forward flexion to 120 degrees.  Passive  abduction to 100 degrees.  She is able to lift her arm actively, above the level of her shoulder.  IMAGING: I personally ordered and reviewed the following images:  X-ray of the right shoulder was obtained in clinic today.  Reverse shoulder arthroplasty remains in stable position.  Shoulder is reduced.  No periprosthetic fractures.  No lucency around the hardware.  No subsidence.  No bony lesions.  Impression: Right reverse shoulder arthroplasty in stable position  Oliver Barre, MD 12/15/2022 9:53 AM

## 2022-12-17 ENCOUNTER — Encounter (HOSPITAL_COMMUNITY): Payer: 59 | Admitting: Occupational Therapy

## 2022-12-20 ENCOUNTER — Encounter (HOSPITAL_COMMUNITY): Payer: 59 | Admitting: Occupational Therapy

## 2022-12-22 ENCOUNTER — Encounter (HOSPITAL_COMMUNITY): Payer: Self-pay | Admitting: Occupational Therapy

## 2022-12-22 ENCOUNTER — Ambulatory Visit (HOSPITAL_COMMUNITY): Payer: 59 | Admitting: Occupational Therapy

## 2022-12-22 DIAGNOSIS — M25511 Pain in right shoulder: Secondary | ICD-10-CM | POA: Diagnosis not present

## 2022-12-22 DIAGNOSIS — R29898 Other symptoms and signs involving the musculoskeletal system: Secondary | ICD-10-CM

## 2022-12-22 DIAGNOSIS — M25611 Stiffness of right shoulder, not elsewhere classified: Secondary | ICD-10-CM

## 2022-12-22 NOTE — Therapy (Signed)
OUTPATIENT OCCUPATIONAL THERAPY ORTHO TREATMENT  Patient Name: Zuleica Rayment MRN: [id number] DOB:24-Sep-1950, 72 y.o., female Today's Date: 12/22/2022    END OF SESSION:  OT End of Session - 12/22/22 1049     Visit Number 4    Number of Visits 12    Date for OT Re-Evaluation 01/10/23    Authorization Type [employer] Medicare Dual Complete    Authorization Time Period no auth required    Progress Note Due on Visit 10    OT Start Time 1008    OT Stop Time 1049    OT Time Calculation (min) 41 min    Activity Tolerance Patient tolerated treatment well    Behavior During Therapy WFL for tasks assessed/performed                Past Medical History:  Diagnosis Date   Dysrhythmia    High cholesterol    Hypertension    IBS (irritable bowel syndrome)    Rheumatoid arthritis (HCC)    Tachycardia    Past Surgical History:  Procedure Laterality Date   BIOPSY  04/14/2022   Procedure: BIOPSY;  Surgeon: Dolores Frame, MD;  Location: [facility];  Service: Gastroenterology;;   COLONOSCOPY N/A 10/07/2016   Procedure: COLONOSCOPY;  Surgeon: Malissa Hippo, MD;  Location: [facility];  Service: Endoscopy;  Laterality: N/A;  730   COLONOSCOPY WITH PROPOFOL N/A 04/14/2022   Procedure: COLONOSCOPY WITH PROPOFOL;  Surgeon: Dolores Frame, MD;  Location: [facility];  Service: Gastroenterology;  Laterality: N/A;  915am, asa 2   POLYPECTOMY  04/14/2022   Procedure: POLYPECTOMY;  Surgeon: Dolores Frame, MD;  Location: [facility];  Service: Gastroenterology;;   RECONSTRUCTION OF EYELID  09/02/2021   REVERSE SHOULDER ARTHROPLASTY Right 11/01/2022   Procedure: RIGHT REVERSE SHOULDER ARTHROPLASTY;  Surgeon: Oliver Barre, MD;  Location: [facility];  Service: Orthopedics;  Laterality: Right;  RNFA NEEDED   TUBAL LIGATION     VAGINAL HYSTERECTOMY N/A 10/07/2021   Procedure: HYSTERECTOMY VAGINAL;  Surgeon: Lazaro Arms, MD;  Location: [facility];  Service:  Gynecology;  Laterality: N/A;   Patient Active Problem List   Diagnosis Date Noted   Rotator cuff arthropathy of right shoulder 11/01/2022   Constipation 03/11/2022   Liver hemangioma 03/11/2022   Mixed hyperlipidemia 01/29/2021   Rheumatoid arteritis (HCC) 06/26/2020   Pernicious anemia 06/26/2020   Mild valvular heart disease 06/26/2020   Rheumatoid arthritis with rheumatoid factor of multiple sites without organ or systems involvement (HCC) 09/06/2019   High risk medication use 09/06/2019   Elevated LFTs 09/06/2019   Age-related osteoporosis without current pathological fracture 09/06/2019   Dyslipidemia 08/14/2019   Abnormal electrocardiogram 03/12/2019   Chest pain 03/12/2019   HTN (hypertension) 03/12/2019   Obesity 03/12/2019   Family history of colon cancer 05/28/2016   PCP: Dr. Orpah Cobb REFERRING PROVIDER: Dr. Thane Edu  ONSET DATE: 11/01/22  REFERRING DIAG: N82.956 (ICD-10-CM) - Status post reverse arthroplasty of right shoulder   THERAPY DIAG:  Acute pain of right shoulder  Stiffness of right shoulder, not elsewhere classified  Other symptoms and signs involving the musculoskeletal system  Rationale for Evaluation and Treatment: Rehabilitation  SUBJECTIVE:   SUBJECTIVE STATEMENT: S: He said it's healing well.    PERTINENT HISTORY: Pt is a 72 y/o female s/p right reverse TSA on 11/01/2022. Pt presents in sling without abduction pillow. Has been completing HEP given by MD.   PRECAUTIONS: Shoulder See protocol.  WEIGHT BEARING RESTRICTIONS: Yes NWB  PAIN:  Are you having pain? No  FALLS: Has patient fallen in last 6 months? No  PLOF: Independent  PATIENT GOALS: To be able to use her arm.   NEXT MD VISIT: 01/26/23  OBJECTIVE:  Note: Objective measures were completed at Evaluation unless otherwise noted.  HAND DOMINANCE: Left  ADLs: Overall ADLs: Pt is having difficulty with dressing, bathing, reaching up to wash and fix her hair. Pt  is unable to reach overhead or behind back. Pt has difficulty with lifting tasks such as meal preparation and housework. Pt is sleeping in a lift chair, has tried the bed a couple of nights but is more sore in the mornings.    FUNCTIONAL OUTCOME MEASURES: FOTO: 51/100  UPPER EXTREMITY ROM:     Passive ROM Right eval  Shoulder flexion 120  Shoulder abduction 121  Shoulder internal rotation 90  Shoulder external rotation 20  (Blank rows = not tested)  Active ROM Right eval  Shoulder flexion 83  Shoulder abduction 88  Shoulder internal rotation 90  Shoulder external rotation 20  (Blank rows = not tested)   UPPER EXTREMITY MMT:     MMT Right eval  Shoulder flexion 3-/5  Shoulder abduction 3-/5  Shoulder internal rotation 3/5  Shoulder external rotation 3-/5  (Blank rows = not tested)   EDEMA: occasional ice pack use for swelling  OBSERVATIONS: moderate fascial restrictions in right upper arm, anterior shoulder, trapezius, and scapular regions   TODAY'S TREATMENT:                                                                                                                              DATE:  12/22/22 -myofascial release to right upper arm, anterior shoulder, trapezius, and scapular regions to decrease pain and fascial restrictions and increase joint ROM.  -P/ROM: supine-flexion, abduction, er, horizontal abduction, 5 reps  -AA/ROM: supine-protraction, flexion, horizontal abduction, er, abduction, 12 reps -AA/ROM: standing-protraction, flexion, horizontal abduction, er, abduction, 10 reps -Wall wash: 1' flexion -Scapular strengthening: red band-row, extension, 10 reps -Pulleys: 1' flexion, 1' abduction -UBE: level 1, 3' forward 3' reverse, pace: 5.5  12/10/22 -myofascial release to right upper arm, anterior shoulder, trapezius, and scapular regions to decrease pain and fascial restrictions and increase joint ROM.  -P/ROM: supine-flexion, abduction, er, horizontal  abduction, 5 reps  -AA/ROM: supine-protraction, flexion, horizontal abduction, er, abduction, 10 reps -Isometrics: standing-flexion, extension, abduction, adduction, er, IR, 3x10" holds -Wall wash: 1' flexion -AA/ROM: standing-protraction, flexion, horizontal abduction, er, abduction, 10 reps -Pulleys: 2' flexion, 2' abduction  12/08/22 -myofascial release to right upper arm, anterior shoulder, trapezius, and scapular regions to decrease pain and fascial restrictions and increase joint ROM.  -P/ROM: supine-flexion, abduction, er, horizontal abduction, 5 reps  -AA/ROM: supine-protraction, flexion, horizontal abduction, er, abduction, 10 reps -AA/ROM: standing-protraction, flexion, horizontal abduction, er, abduction, 10 reps -Wall wash: 1' flexion -Thumb tacks: 1' 90 degrees flexion -Isometrics: 2x10" holds-flexion, extension, abduction,  adduction, er, IR -Pulleys: 1' flexion, 1' abduction   PATIENT EDUCATION: Education details: reviewed HEP Person educated: Patient Education method: Explanation, Demonstration, and Handouts Education comprehension: verbalized understanding and returned demonstration  HOME EXERCISE PROGRAM: Eval:  table slides; scapular A/ROM 12/4: AA/ROM  GOALS: Goals reviewed with patient? Yes  SHORT TERM GOALS: Target date: 12/20/22  Pt will be provided with and educated on HEP to improve mobility in RUE required for use during ADL completion.   Goal status: IN PROGRESS  2.  Pt will increase RUE P/ROM by 10+ degrees to improve ability to use RUE during dressing tasks with minimal compensatory techniques.   Goal status: IN PROGRESS   LONG TERM GOALS: Target date: 01/10/23  Pt will decrease pain in RUE to 3/10 or less to improve ability to sleep for 2+ consecutive hours without waking due to pain.   Goal status: IN PROGRESS  2.  Pt will decrease RUE fascial restrictions to min amounts or less to improve mobility required for functional reaching tasks.    Goal status: IN PROGRESS  3.  Pt will increase RUE A/ROM by 40+ degrees to improve ability to use RUE when reaching overhead or behind back during dressing and bathing tasks.   Goal status: IN PROGRESS  4.  Pt will increase RUE strength to 4+/5 or greater to improve ability to use RUE when lifting or carrying items during meal preparation/housework/yardwork tasks.   Goal status: IN PROGRESS   ASSESSMENT:  CLINICAL IMPRESSION: Pt reports MD appt went well, continues to have soreness along upper arm. Continued with manual techniques and passive stretching, resumed all AA/ROM without increased pain or difficulty. Added scapular theraband strengthening per protocol with exception of retraction. Pt tolerating activities well, no reports of increased pain during session. Does feel pulling/soreness with er during AA/ROM.  Verbal cuing for form and technique.   PERFORMANCE DEFICITS: in functional skills including ADLs, IADLs, edema, ROM, strength, pain, fascial restrictions, and UE functional use     PLAN:  OT FREQUENCY: 2x/week  OT DURATION: 6 weeks  PLANNED INTERVENTIONS: 97168 OT Re-evaluation, 97535 self care/ADL training, 16109 therapeutic exercise, 97530 therapeutic activity, 97140 manual therapy, 97035 ultrasound, 97010 moist heat, 97010 cryotherapy, 97014 electrical stimulation unattended, patient/family education, and DME and/or AE instructions  CONSULTED AND AGREED WITH PLAN OF CARE: Patient  PLAN FOR NEXT SESSION: Follow up on HEP, continue with AA/ROM and scapular theraband, adding retraction when appropriate. Update HEP if pt completing with good form   Ezra Sites, OTR/L  [phone] 12/22/2022, 10:50 AM

## 2022-12-24 ENCOUNTER — Ambulatory Visit (HOSPITAL_COMMUNITY): Payer: 59 | Admitting: Occupational Therapy

## 2022-12-24 ENCOUNTER — Encounter (HOSPITAL_COMMUNITY): Payer: Self-pay | Admitting: Occupational Therapy

## 2022-12-24 DIAGNOSIS — M25511 Pain in right shoulder: Secondary | ICD-10-CM | POA: Diagnosis not present

## 2022-12-24 DIAGNOSIS — R29898 Other symptoms and signs involving the musculoskeletal system: Secondary | ICD-10-CM

## 2022-12-24 DIAGNOSIS — M25611 Stiffness of right shoulder, not elsewhere classified: Secondary | ICD-10-CM

## 2022-12-24 NOTE — Patient Instructions (Signed)

## 2022-12-24 NOTE — Therapy (Signed)
OUTPATIENT OCCUPATIONAL THERAPY ORTHO TREATMENT  Patient Name: Tammy Jensen MRN: [id number] DOB:[birth year], 72 y.o., female Today's Date: 12/24/2022    END OF SESSION:  OT End of Session - 12/24/22 1006     Visit Number 5    Number of Visits 12    Date for OT Re-Evaluation 01/10/23    Authorization Type [employer] Medicare Dual Complete    Authorization Time Period no auth required    Progress Note Due on Visit 10    OT Start Time [phone]    OT Stop Time 1008    OT Time Calculation (min) 43 min    Activity Tolerance Patient tolerated treatment well    Behavior During Therapy WFL for tasks assessed/performed                 Past Medical History:  Diagnosis Date   Dysrhythmia    High cholesterol    Hypertension    IBS (irritable bowel syndrome)    Rheumatoid arthritis (HCC)    Tachycardia    Past Surgical History:  Procedure Laterality Date   BIOPSY  04/14/2022   Procedure: BIOPSY;  Surgeon: Dolores Frame, MD;  Location: [facility];  Service: Gastroenterology;;   COLONOSCOPY N/A 10/07/2016   Procedure: COLONOSCOPY;  Surgeon: Malissa Hippo, MD;  Location: [facility];  Service: Endoscopy;  Laterality: N/A;  730   COLONOSCOPY WITH PROPOFOL N/A 04/14/2022   Procedure: COLONOSCOPY WITH PROPOFOL;  Surgeon: Dolores Frame, MD;  Location: [facility];  Service: Gastroenterology;  Laterality: N/A;  915am, asa 2   POLYPECTOMY  04/14/2022   Procedure: POLYPECTOMY;  Surgeon: Dolores Frame, MD;  [facility]: [facility];  Service: Gastroenterology;;   RECONSTRUCTION OF EYELID  09/02/2021   REVERSE SHOULDER ARTHROPLASTY Right 11/01/2022   Procedure: RIGHT REVERSE SHOULDER ARTHROPLASTY;  Surgeon: Oliver Barre, MD;  Location: [facility];  Service: Orthopedics;  Laterality: Right;  RNFA NEEDED   TUBAL LIGATION     VAGINAL HYSTERECTOMY N/A 10/07/2021   Procedure: HYSTERECTOMY VAGINAL;  Surgeon: Lazaro Arms, MD;  Location: [facility];  Service:  Gynecology;  Laterality: N/A;   Patient Active Problem List   Diagnosis Date Noted   Rotator cuff arthropathy of right shoulder 11/01/2022   Constipation 03/11/2022   Liver hemangioma 03/11/2022   Mixed hyperlipidemia 01/29/2021   Rheumatoid arteritis (HCC) 06/26/2020   Pernicious anemia 06/26/2020   Mild valvular heart disease 06/26/2020   Rheumatoid arthritis with rheumatoid factor of multiple sites without organ or systems involvement (HCC) 09/06/2019   High risk medication use 09/06/2019   Elevated LFTs 09/06/2019   Age-related osteoporosis without current pathological fracture 09/06/2019   Dyslipidemia 08/14/2019   Abnormal electrocardiogram 03/12/2019   Chest pain 03/12/2019   HTN (hypertension) 03/12/2019   Obesity 03/12/2019   Family history of colon cancer 05/28/2016   PCP: Dr. Orpah Cobb REFERRING PROVIDER: Dr. Thane Edu  ONSET DATE: 11/01/22  REFERRING DIAG: H47.425 (ICD-10-CM) - Status post reverse arthroplasty of right shoulder   THERAPY DIAG:  Acute pain of right shoulder  Stiffness of right shoulder, not elsewhere classified  Other symptoms and signs involving the musculoskeletal system  Rationale for Evaluation and Treatment: Rehabilitation  SUBJECTIVE:   SUBJECTIVE STATEMENT: S: It's feeling looser today.   PERTINENT HISTORY: Pt is a 72 y/o female s/p right reverse TSA on 11/01/2022. Pt presents in sling without abduction pillow. Has been completing HEP given by MD.   PRECAUTIONS: Shoulder See protocol.   WEIGHT  BEARING RESTRICTIONS: Yes NWB  PAIN:  Are you having pain? Yes: NPRS scale: 2/10 Pain location: right shoulder Pain description: sore Aggravating factors: weather Relieving factors: stretching  FALLS: Has patient fallen in last 6 months? No  PLOF: Independent  PATIENT GOALS: To be able to use her arm.   NEXT MD VISIT: 01/26/23  OBJECTIVE:  Note: Objective measures were completed at Evaluation unless otherwise  noted.  HAND DOMINANCE: Left  ADLs: Overall ADLs: Pt is having difficulty with dressing, bathing, reaching up to wash and fix her hair. Pt is unable to reach overhead or behind back. Pt has difficulty with lifting tasks such as meal preparation and housework. Pt is sleeping in a lift chair, has tried the bed a couple of nights but is more sore in the mornings.    FUNCTIONAL OUTCOME MEASURES: FOTO: 51/100  UPPER EXTREMITY ROM:     Passive ROM Right eval  Shoulder flexion 120  Shoulder abduction 121  Shoulder internal rotation 90  Shoulder external rotation 20  (Blank rows = not tested)  Active ROM Right eval  Shoulder flexion 83  Shoulder abduction 88  Shoulder internal rotation 90  Shoulder external rotation 20  (Blank rows = not tested)   UPPER EXTREMITY MMT:     MMT Right eval  Shoulder flexion 3-/5  Shoulder abduction 3-/5  Shoulder internal rotation 3/5  Shoulder external rotation 3-/5  (Blank rows = not tested)   EDEMA: occasional ice pack use for swelling  OBSERVATIONS: moderate fascial restrictions in right upper arm, anterior shoulder, trapezius, and scapular regions   TODAY'S TREATMENT:                                                                                                                              DATE: 12/24/22 -myofascial release to right upper arm, anterior shoulder, trapezius, and scapular regions to decrease pain and fascial restrictions and increase joint ROM.  -P/ROM: supine-flexion, abduction, er, horizontal abduction, 5 reps  -AA/ROM: supine-protraction, flexion, horizontal abduction, er, abduction, 15 reps -Proximal shoulder strengthening: supine-paddles, criss cross, circles each direction, 10 reps -AA/ROM: standing-protraction, flexion, horizontal abduction, er, abduction, 10 reps -Scapular strengthening: red band-row, extension, retraction 10 reps -Wall wash: 1' flexion -Thumb tacks at shoulder height, 1' -Functional reaching:  pt placing 10 cones on top shelf of overhead cabinet in flexion, then removing -UBE: level 1, 3' forward 3' reverse, pace: 5.5  12/22/22 -myofascial release to right upper arm, anterior shoulder, trapezius, and scapular regions to decrease pain and fascial restrictions and increase joint ROM.  -P/ROM: supine-flexion, abduction, er, horizontal abduction, 5 reps  -AA/ROM: supine-protraction, flexion, horizontal abduction, er, abduction, 12 reps -AA/ROM: standing-protraction, flexion, horizontal abduction, er, abduction, 10 reps -Wall wash: 1' flexion -Scapular strengthening: red band-row, extension, 10 reps -Pulleys: 1' flexion, 1' abduction -UBE: level 1, 3' forward 3' reverse, pace: 5.5  12/10/22 -myofascial release to right upper arm, anterior shoulder, trapezius, and scapular regions to decrease pain  and fascial restrictions and increase joint ROM.  -P/ROM: supine-flexion, abduction, er, horizontal abduction, 5 reps  -AA/ROM: supine-protraction, flexion, horizontal abduction, er, abduction, 10 reps -Isometrics: standing-flexion, extension, abduction, adduction, er, IR, 3x10" holds -Wall wash: 1' flexion -AA/ROM: standing-protraction, flexion, horizontal abduction, er, abduction, 10 reps -Pulleys: 2' flexion, 2' abduction    PATIENT EDUCATION: Education details: scapular strengthening Person educated: Patient Education method: Explanation, Demonstration, and Handouts Education comprehension: verbalized understanding and returned demonstration  HOME EXERCISE PROGRAM: Eval:  table slides; scapular A/ROM 12/4: AA/ROM 12/20: Scapular strengthening-red band  GOALS: Goals reviewed with patient? Yes  SHORT TERM GOALS: Target date: 12/20/22  Pt will be provided with and educated on HEP to improve mobility in RUE required for use during ADL completion.   Goal status: IN PROGRESS  2.  Pt will increase RUE P/ROM by 10+ degrees to improve ability to use RUE during dressing tasks with  minimal compensatory techniques.   Goal status: IN PROGRESS   LONG TERM GOALS: Target date: 01/10/23  Pt will decrease pain in RUE to 3/10 or less to improve ability to sleep for 2+ consecutive hours without waking due to pain.   Goal status: IN PROGRESS  2.  Pt will decrease RUE fascial restrictions to min amounts or less to improve mobility required for functional reaching tasks.   Goal status: IN PROGRESS  3.  Pt will increase RUE A/ROM by 40+ degrees to improve ability to use RUE when reaching overhead or behind back during dressing and bathing tasks.   Goal status: IN PROGRESS  4.  Pt will increase RUE strength to 4+/5 or greater to improve ability to use RUE when lifting or carrying items during meal preparation/housework/yardwork tasks.   Goal status: IN PROGRESS   ASSESSMENT:  CLINICAL IMPRESSION: Pt reports her shoulder is feeling looser today, HEP going well. Continued with manual techniques noting improvement in trapezius tightness. Completed passive stretching and AA/ROM with focus on form and elbow extension. Added proximal shoulder strengthening, continued with scapular strengthening and added to HEP. Added functional reaching today, pt completing with tolerance and no increased pain. Verbal cuing for form and technique.   PERFORMANCE DEFICITS: in functional skills including ADLs, IADLs, edema, ROM, strength, pain, fascial restrictions, and UE functional use     PLAN:  OT FREQUENCY: 2x/week  OT DURATION: 6 weeks  PLANNED INTERVENTIONS: 97168 OT Re-evaluation, 97535 self care/ADL training, 16109 therapeutic exercise, 97530 therapeutic activity, 97140 manual therapy, 97035 ultrasound, 97010 moist heat, 97010 cryotherapy, 97014 electrical stimulation unattended, patient/family education, and DME and/or AE instructions  CONSULTED AND AGREED WITH PLAN OF CARE: Patient  PLAN FOR NEXT SESSION: Follow up on HEP, continue with scapular theraband, progress to  A/ROM   Ezra Sites, OTR/L  [phone] 12/24/2022, 10:08 AM

## 2022-12-27 ENCOUNTER — Encounter (HOSPITAL_COMMUNITY): Payer: 59 | Admitting: Occupational Therapy

## 2022-12-30 ENCOUNTER — Ambulatory Visit (HOSPITAL_COMMUNITY): Payer: 59 | Admitting: Occupational Therapy

## 2022-12-30 ENCOUNTER — Encounter (HOSPITAL_COMMUNITY): Payer: Self-pay | Admitting: Occupational Therapy

## 2022-12-30 DIAGNOSIS — M25511 Pain in right shoulder: Secondary | ICD-10-CM | POA: Diagnosis not present

## 2022-12-30 DIAGNOSIS — R29898 Other symptoms and signs involving the musculoskeletal system: Secondary | ICD-10-CM

## 2022-12-30 DIAGNOSIS — M25611 Stiffness of right shoulder, not elsewhere classified: Secondary | ICD-10-CM

## 2022-12-30 NOTE — Patient Instructions (Signed)

## 2022-12-30 NOTE — Therapy (Signed)
OUTPATIENT OCCUPATIONAL THERAPY ORTHO TREATMENT  Patient Name: Tammy Jensen MRN: [id number] DOB:[birth year], 72 y.o., female Today's Date: 12/30/2022    END OF SESSION:  OT End of Session - 12/30/22 1000     Visit Number 6    Number of Visits 12    Date for OT Re-Evaluation 01/10/23    Authorization Type [employer] Medicare Dual Complete    Authorization Time Period no auth required    Progress Note Due on Visit 10    OT Start Time [phone]    OT Stop Time 1005    OT Time Calculation (min) 41 min    Activity Tolerance Patient tolerated treatment well    Behavior During Therapy WFL for tasks assessed/performed                  Past Medical History:  Diagnosis Date   Dysrhythmia    High cholesterol    Hypertension    IBS (irritable bowel syndrome)    Rheumatoid arthritis (HCC)    Tachycardia    Past Surgical History:  Procedure Laterality Date   BIOPSY  04/14/2022   Procedure: BIOPSY;  Surgeon: Dolores Frame, MD;  Location: [facility];  Service: Gastroenterology;;   COLONOSCOPY N/A 10/07/2016   Procedure: COLONOSCOPY;  Surgeon: Malissa Hippo, MD;  Location: [facility];  Service: Endoscopy;  Laterality: N/A;  730   COLONOSCOPY WITH PROPOFOL N/A 04/14/2022   Procedure: COLONOSCOPY WITH PROPOFOL;  Surgeon: Dolores Frame, MD;  Location: [facility];  Service: Gastroenterology;  Laterality: N/A;  915am, asa 2   POLYPECTOMY  04/14/2022   Procedure: POLYPECTOMY;  Surgeon: Dolores Frame, MD;  Location: [facility];  Service: Gastroenterology;;   RECONSTRUCTION OF EYELID  09/02/2021   REVERSE SHOULDER ARTHROPLASTY Right 11/01/2022   Procedure: RIGHT REVERSE SHOULDER ARTHROPLASTY;  Surgeon: Oliver Barre, MD;  Location: [facility];  Service: Orthopedics;  Laterality: Right;  RNFA NEEDED   TUBAL LIGATION     VAGINAL HYSTERECTOMY N/A 10/07/2021   Procedure: HYSTERECTOMY VAGINAL;  Surgeon: Lazaro Arms, MD;  Location: [facility];  Service:  Gynecology;  Laterality: N/A;   Patient Active Problem List   Diagnosis Date Noted   Rotator cuff arthropathy of right shoulder 11/01/2022   Constipation 03/11/2022   Liver hemangioma 03/11/2022   Mixed hyperlipidemia 01/29/2021   Rheumatoid arteritis (HCC) 06/26/2020   Pernicious anemia 06/26/2020   Mild valvular heart disease 06/26/2020   Rheumatoid arthritis with rheumatoid factor of multiple sites without organ or systems involvement (HCC) 09/06/2019   High risk medication use 09/06/2019   Elevated LFTs 09/06/2019   Age-related osteoporosis without current pathological fracture 09/06/2019   Dyslipidemia 08/14/2019   Abnormal electrocardiogram 03/12/2019   Chest pain 03/12/2019   HTN (hypertension) 03/12/2019   Obesity 03/12/2019   Family history of colon cancer 05/28/2016   PCP: Dr. Orpah Cobb REFERRING PROVIDER: Dr. Thane Edu  ONSET DATE: 11/01/22  REFERRING DIAG: J19.147 (ICD-10-CM) - Status post reverse arthroplasty of right shoulder   THERAPY DIAG:  Acute pain of right shoulder  Stiffness of right shoulder, not elsewhere classified  Other symptoms and signs involving the musculoskeletal system  Rationale for Evaluation and Treatment: Rehabilitation  SUBJECTIVE:   SUBJECTIVE STATEMENT: S: I hit my arm on the front of the seat of a van.    PERTINENT HISTORY: Pt is a 72 y/o female s/p right reverse TSA on 11/01/2022. Pt presents in sling without abduction pillow. Has been completing HEP given  by MD.   PRECAUTIONS: Shoulder See protocol.   WEIGHT BEARING RESTRICTIONS: Yes NWB  PAIN:  Are you having pain? Yes: NPRS scale: 2/10 Pain location: right shoulder Pain description: sore Aggravating factors: hit on sit, weather Relieving factors: stretching  FALLS: Has patient fallen in last 6 months? No  PLOF: Independent  PATIENT GOALS: To be able to use her arm.   NEXT MD VISIT: 01/26/23  OBJECTIVE:  Note: Objective measures were completed at  Evaluation unless otherwise noted.  HAND DOMINANCE: Left  ADLs: Overall ADLs: Pt is having difficulty with dressing, bathing, reaching up to wash and fix her hair. Pt is unable to reach overhead or behind back. Pt has difficulty with lifting tasks such as meal preparation and housework. Pt is sleeping in a lift chair, has tried the bed a couple of nights but is more sore in the mornings.    FUNCTIONAL OUTCOME MEASURES: FOTO: 51/100  UPPER EXTREMITY ROM:     Passive ROM Right eval  Shoulder flexion 120  Shoulder abduction 121  Shoulder internal rotation 90  Shoulder external rotation 20  (Blank rows = not tested)  Active ROM Right eval  Shoulder flexion 83  Shoulder abduction 88  Shoulder internal rotation 90  Shoulder external rotation 20  (Blank rows = not tested)   UPPER EXTREMITY MMT:     MMT Right eval  Shoulder flexion 3-/5  Shoulder abduction 3-/5  Shoulder internal rotation 3/5  Shoulder external rotation 3-/5  (Blank rows = not tested)   EDEMA: occasional ice pack use for swelling  OBSERVATIONS: moderate fascial restrictions in right upper arm, anterior shoulder, trapezius, and scapular regions   TODAY'S TREATMENT:                                                                                                                              DATE: 12/30/22 -myofascial release to right upper arm, anterior shoulder, trapezius, and scapular regions to decrease pain and fascial restrictions and increase joint ROM.  -P/ROM: supine-flexion, abduction, er, horizontal abduction, 5 reps  -A/ROM: supine-protraction, flexion, horizontal abduction, er, abduction, 10 reps -Proximal shoulder strengthening: supine-paddles, criss cross, circles each direction, 10 reps -A/ROM: standing-protraction, flexion, horizontal abduction, er, abduction, 10 reps -Proximal shoulder strengthening: standing-paddles, criss cross, circles each direction, 10 reps -Wall wash: 1'  flexion -Proximal shoulder strengthening on doorway-1', 90 degrees flexion -Scapular strengthening: red band-row, extension, retraction 10 reps -Overhead lacing: seated-lacing from top down then reversing -Functional reaching: pt placing 10 cones on top shelf of overhead cabinet in flexion, then removing -UBE: level 1, 3' forward 3' reverse, pace: 5.5  12/24/22 -myofascial release to right upper arm, anterior shoulder, trapezius, and scapular regions to decrease pain and fascial restrictions and increase joint ROM.  -P/ROM: supine-flexion, abduction, er, horizontal abduction, 5 reps  -AA/ROM: supine-protraction, flexion, horizontal abduction, er, abduction, 15 reps -Proximal shoulder strengthening: supine-paddles, criss cross, circles each direction, 10 reps -AA/ROM: standing-protraction, flexion, horizontal abduction,  er, abduction, 10 reps -Scapular strengthening: red band-row, extension, retraction 10 reps -Wall wash: 1' flexion -Thumb tacks at shoulder height, 1' -Functional reaching: pt placing 10 cones on top shelf of overhead cabinet in flexion, then removing -UBE: level 1, 3' forward 3' reverse, pace: 5.5  12/22/22 -myofascial release to right upper arm, anterior shoulder, trapezius, and scapular regions to decrease pain and fascial restrictions and increase joint ROM.  -P/ROM: supine-flexion, abduction, er, horizontal abduction, 5 reps  -AA/ROM: supine-protraction, flexion, horizontal abduction, er, abduction, 12 reps -AA/ROM: standing-protraction, flexion, horizontal abduction, er, abduction, 10 reps -Wall wash: 1' flexion -Scapular strengthening: red band-row, extension, 10 reps -Pulleys: 1' flexion, 1' abduction -UBE: level 1, 3' forward 3' reverse, pace: 5.5    PATIENT EDUCATION: Education details: A/ROM Person educated: Patient Education method: Programmer, multimedia, Demonstration, and Handouts Education comprehension: verbalized understanding and returned  demonstration  HOME EXERCISE PROGRAM: Eval:  table slides; scapular A/ROM 12/4: AA/ROM 12/20: Scapular strengthening-red band 12/26: A/ROM   GOALS: Goals reviewed with patient? Yes  SHORT TERM GOALS: Target date: 12/20/22  Pt will be provided with and educated on HEP to improve mobility in RUE required for use during ADL completion.   Goal status: IN PROGRESS  2.  Pt will increase RUE P/ROM by 10+ degrees to improve ability to use RUE during dressing tasks with minimal compensatory techniques.   Goal status: IN PROGRESS   LONG TERM GOALS: Target date: 01/10/23  Pt will decrease pain in RUE to 3/10 or less to improve ability to sleep for 2+ consecutive hours without waking due to pain.   Goal status: IN PROGRESS  2.  Pt will decrease RUE fascial restrictions to min amounts or less to improve mobility required for functional reaching tasks.   Goal status: IN PROGRESS  3.  Pt will increase RUE A/ROM by 40+ degrees to improve ability to use RUE when reaching overhead or behind back during dressing and bathing tasks.   Goal status: IN PROGRESS  4.  Pt will increase RUE strength to 4+/5 or greater to improve ability to use RUE when lifting or carrying items during meal preparation/housework/yardwork tasks.   Goal status: IN PROGRESS   ASSESSMENT:  CLINICAL IMPRESSION: Pt reports her shoulder is sore where she hit it on a van last week. Continued with manual techniques and passive stretching, progressed to A/ROM per protocol as pt is now 8 weeks out from surgery. Continued with scapular strengthening and functional reaching tasks. Added overhead lacing and proximal shoulder strengthening on doorway today. Pt with A/ROM 75%+ throughout tasks with good activity tolerance. Verbal cuing for form and technique.   PERFORMANCE DEFICITS: in functional skills including ADLs, IADLs, edema, ROM, strength, pain, fascial restrictions, and UE functional use     PLAN:  OT FREQUENCY:  2x/week  OT DURATION: 6 weeks  PLANNED INTERVENTIONS: 97168 OT Re-evaluation, 97535 self care/ADL training, 16109 therapeutic exercise, 97530 therapeutic activity, 97140 manual therapy, 97035 ultrasound, 97010 moist heat, 97010 cryotherapy, 97014 electrical stimulation unattended, patient/family education, and DME and/or AE instructions  CONSULTED AND AGREED WITH PLAN OF CARE: Patient  PLAN FOR NEXT SESSION: Follow up on HEP, continue with scapular theraband, add shoulder stretches and ball on wall   Ezra Sites, OTR/L  [phone] 12/30/2022, 10:05 AM

## 2022-12-31 ENCOUNTER — Encounter (HOSPITAL_COMMUNITY): Payer: Self-pay | Admitting: Occupational Therapy

## 2022-12-31 ENCOUNTER — Ambulatory Visit (HOSPITAL_COMMUNITY): Payer: 59 | Admitting: Occupational Therapy

## 2022-12-31 DIAGNOSIS — M25611 Stiffness of right shoulder, not elsewhere classified: Secondary | ICD-10-CM

## 2022-12-31 DIAGNOSIS — M25511 Pain in right shoulder: Secondary | ICD-10-CM

## 2022-12-31 DIAGNOSIS — R29898 Other symptoms and signs involving the musculoskeletal system: Secondary | ICD-10-CM

## 2022-12-31 NOTE — Therapy (Signed)
OUTPATIENT OCCUPATIONAL THERAPY ORTHO TREATMENT  Patient Name: Tammy Jensen MRN: [id number] DOB:08-22-1950, 72 y.o., female Today's Date: 12/31/2022    END OF SESSION:  OT End of Session - 12/31/22 1051     Visit Number 7    Number of Visits 12    Date for OT Re-Evaluation 01/10/23    Authorization Type [employer] Medicare Dual Complete    Authorization Time Period no auth required    Progress Note Due on Visit 10    OT Start Time 1015    OT Stop Time 1055    OT Time Calculation (min) 40 min    Activity Tolerance Patient tolerated treatment well    Behavior During Therapy WFL for tasks assessed/performed                   Past Medical History:  Diagnosis Date   Dysrhythmia    High cholesterol    Hypertension    IBS (irritable bowel syndrome)    Rheumatoid arthritis (HCC)    Tachycardia    Past Surgical History:  Procedure Laterality Date   BIOPSY  04/14/2022   Procedure: BIOPSY;  Surgeon: Dolores Frame, MD;  [facility]: [facility];  Service: Gastroenterology;;   COLONOSCOPY N/A 10/07/2016   Procedure: COLONOSCOPY;  Surgeon: Malissa Hippo, MD;  Location: [facility];  Service: Endoscopy;  Laterality: N/A;  730   COLONOSCOPY WITH PROPOFOL N/A 04/14/2022   Procedure: COLONOSCOPY WITH PROPOFOL;  Surgeon: Dolores Frame, MD;  [facility];  Service: Gastroenterology;  Laterality: N/A;  915am, asa 2   POLYPECTOMY  04/14/2022   Procedure: POLYPECTOMY;  Surgeon: Dolores Frame, MD;  [facility];  Service: Gastroenterology;;   RECONSTRUCTION OF EYELID  09/02/2021   REVERSE SHOULDER ARTHROPLASTY Right 11/01/2022   Procedure: RIGHT REVERSE SHOULDER ARTHROPLASTY;  Surgeon: Oliver Barre, MD;  Location: [facility];  Service: Orthopedics;  Laterality: Right;  RNFA NEEDED   TUBAL LIGATION     VAGINAL HYSTERECTOMY N/A 10/07/2021   Procedure: HYSTERECTOMY VAGINAL;  Surgeon: Lazaro Arms, MD;  Location: [facility];   Service: Gynecology;  Laterality: N/A;   Patient Active Problem List   Diagnosis Date Noted   Rotator cuff arthropathy of right shoulder 11/01/2022   Constipation 03/11/2022   Liver hemangioma 03/11/2022   Mixed hyperlipidemia 01/29/2021   Rheumatoid arteritis (HCC) 06/26/2020   Pernicious anemia 06/26/2020   Mild valvular heart disease 06/26/2020   Rheumatoid arthritis with rheumatoid factor of multiple sites without organ or systems involvement (HCC) 09/06/2019   High risk medication use 09/06/2019   Elevated LFTs 09/06/2019   Age-related osteoporosis without current pathological fracture 09/06/2019   Dyslipidemia 08/14/2019   Abnormal electrocardiogram 03/12/2019   Chest pain 03/12/2019   HTN (hypertension) 03/12/2019   Obesity 03/12/2019   Family history of colon cancer 05/28/2016   PCP: Dr. Orpah Cobb REFERRING PROVIDER: Dr. Thane Edu  ONSET DATE: 11/01/22  REFERRING DIAG: W09.811 (ICD-10-CM) - Status post reverse arthroplasty of right shoulder   THERAPY DIAG:  Acute pain of right shoulder  Stiffness of right shoulder, not elsewhere classified  Other symptoms and signs involving the musculoskeletal system  Rationale for Evaluation and Treatment: Rehabilitation  SUBJECTIVE:   SUBJECTIVE STATEMENT: S: My shoulder has been doing good     PERTINENT HISTORY: Pt is a 72 y/o female s/p right reverse TSA on 11/01/2022. Pt presents in sling without abduction pillow. Has been completing HEP given by MD.   PRECAUTIONS:  Shoulder See protocol.   WEIGHT BEARING RESTRICTIONS: Yes NWB  PAIN:  Are you having pain? No   FALLS: Has patient fallen in last 6 months? No  PLOF: Independent  PATIENT GOALS: To be able to use her arm.   NEXT MD VISIT: 01/26/23  OBJECTIVE:  Note: Objective measures were completed at Evaluation unless otherwise noted.  HAND DOMINANCE: Left  ADLs: Overall ADLs: Pt is having difficulty with dressing, bathing, reaching up to wash and  fix her hair. Pt is unable to reach overhead or behind back. Pt has difficulty with lifting tasks such as meal preparation and housework. Pt is sleeping in a lift chair, has tried the bed a couple of nights but is more sore in the mornings.    FUNCTIONAL OUTCOME MEASURES: FOTO: 51/100  UPPER EXTREMITY ROM:     Passive ROM Right eval  Shoulder flexion 120  Shoulder abduction 121  Shoulder internal rotation 90  Shoulder external rotation 20  (Blank rows = not tested)  Active ROM Right eval  Shoulder flexion 83  Shoulder abduction 88  Shoulder internal rotation 90  Shoulder external rotation 20  (Blank rows = not tested)   UPPER EXTREMITY MMT:     MMT Right eval  Shoulder flexion 3-/5  Shoulder abduction 3-/5  Shoulder internal rotation 3/5  Shoulder external rotation 3-/5  (Blank rows = not tested)   EDEMA: occasional ice pack use for swelling  OBSERVATIONS: moderate fascial restrictions in right upper arm, anterior shoulder, trapezius, and scapular regions   TODAY'S TREATMENT:                                                                                                                              DATE:  12/31/22 -myofascial release to right upper arm, anterior shoulder, trapezius, and scapular regions to decrease pain and fascial restrictions and increase joint ROM.  -P/ROM: supine-flexion, abduction, er, horizontal abduction, 5 reps  -A/ROM: supine-protraction, flexion, horizontal abduction, er, abduction, 10 reps -A/ROM: standing-protraction, flexion, horizontal abduction, er, abduction, 10 reps -Proximal shoulder strengthening: standing-paddles, criss cross, circles each direction, 10 reps -Ball on wall: rolling yellow ball up and down wall 5x 2 sets  -Overhead lacing: seated-lacing from top down then reversing -UBE: level 1, 3' forward 3' reverse, pace: 5.5   12/30/22 -myofascial release to right upper arm, anterior shoulder, trapezius, and scapular  regions to decrease pain and fascial restrictions and increase joint ROM.  -P/ROM: supine-flexion, abduction, er, horizontal abduction, 5 reps  -A/ROM: supine-protraction, flexion, horizontal abduction, er, abduction, 10 reps -Proximal shoulder strengthening: supine-paddles, criss cross, circles each direction, 10 reps -A/ROM: standing-protraction, flexion, horizontal abduction, er, abduction, 10 reps -Proximal shoulder strengthening: standing-paddles, criss cross, circles each direction, 10 reps -Wall wash: 1' flexion -Proximal shoulder strengthening on doorway-1', 90 degrees flexion -Scapular strengthening: red band-row, extension, retraction 10 reps -Overhead lacing: seated-lacing from top down then reversing -Functional reaching: pt placing 10 cones on top shelf of overhead  cabinet in flexion, then removing -UBE: level 1, 3' forward 3' reverse, pace: 5.5  12/24/22 -myofascial release to right upper arm, anterior shoulder, trapezius, and scapular regions to decrease pain and fascial restrictions and increase joint ROM.  -P/ROM: supine-flexion, abduction, er, horizontal abduction, 5 reps  -AA/ROM: supine-protraction, flexion, horizontal abduction, er, abduction, 15 reps -Proximal shoulder strengthening: supine-paddles, criss cross, circles each direction, 10 reps -AA/ROM: standing-protraction, flexion, horizontal abduction, er, abduction, 10 reps -Scapular strengthening: red band-row, extension, retraction 10 reps -Wall wash: 1' flexion -Thumb tacks at shoulder height, 1' -Functional reaching: pt placing 10 cones on top shelf of overhead cabinet in flexion, then removing -UBE: level 1, 3' forward 3' reverse, pace: 5.5    PATIENT EDUCATION: Education details: A/ROM Person educated: Patient Education method: Programmer, multimedia, Demonstration, and Handouts Education comprehension: verbalized understanding and returned demonstration  HOME EXERCISE PROGRAM: Eval:  table slides; scapular  A/ROM 12/4: AA/ROM 12/20: Scapular strengthening-red band 12/26: A/ROM   GOALS: Goals reviewed with patient? Yes  SHORT TERM GOALS: Target date: 12/20/22  Pt will be provided with and educated on HEP to improve mobility in RUE required for use during ADL completion.   Goal status: IN PROGRESS  2.  Pt will increase RUE P/ROM by 10+ degrees to improve ability to use RUE during dressing tasks with minimal compensatory techniques.   Goal status: IN PROGRESS   LONG TERM GOALS: Target date: 01/10/23  Pt will decrease pain in RUE to 3/10 or less to improve ability to sleep for 2+ consecutive hours without waking due to pain.   Goal status: IN PROGRESS  2.  Pt will decrease RUE fascial restrictions to min amounts or less to improve mobility required for functional reaching tasks.   Goal status: IN PROGRESS  3.  Pt will increase RUE A/ROM by 40+ degrees to improve ability to use RUE when reaching overhead or behind back during dressing and bathing tasks.   Goal status: IN PROGRESS  4.  Pt will increase RUE strength to 4+/5 or greater to improve ability to use RUE when lifting or carrying items during meal preparation/housework/yardwork tasks.   Goal status: IN PROGRESS   ASSESSMENT:  CLINICAL IMPRESSION: Pt reports no pain this session. She stated that she is able to wash her hair and reach into overhead cabinets recently. She continues to demonstrate good ROM. Continues with proximal strengthening and scapular strengthening. Pt participates very well in session and reports no pain. VC throughout session for form.   PERFORMANCE DEFICITS: in functional skills including ADLs, IADLs, edema, ROM, strength, pain, fascial restrictions, and UE functional use     PLAN:  OT FREQUENCY: 2x/week  OT DURATION: 6 weeks  PLANNED INTERVENTIONS: 97168 OT Re-evaluation, 97535 self care/ADL training, 52841 therapeutic exercise, 97530 therapeutic activity, 97140 manual therapy, 97035  ultrasound, 97010 moist heat, 97010 cryotherapy, 97014 electrical stimulation unattended, patient/family education, and DME and/or AE instructions  CONSULTED AND AGREED WITH PLAN OF CARE: Patient  PLAN FOR NEXT SESSION: Follow up on HEP, continue with scapular theraband, add shoulder stretches and ball on wall   Lurena Joiner, OTR/L  [phone] 12/31/2022, 10:53 AM

## 2023-01-03 ENCOUNTER — Encounter (HOSPITAL_COMMUNITY): Payer: 59 | Admitting: Occupational Therapy

## 2023-01-06 ENCOUNTER — Telehealth: Payer: Self-pay | Admitting: Orthopedic Surgery

## 2023-01-06 ENCOUNTER — Encounter: Payer: Medicaid Other | Attending: Physician Assistant | Admitting: *Deleted

## 2023-01-06 ENCOUNTER — Inpatient Hospital Stay (HOSPITAL_COMMUNITY): Admission: RE | Admit: 2023-01-06 | Payer: 59 | Source: Ambulatory Visit

## 2023-01-06 ENCOUNTER — Ambulatory Visit (HOSPITAL_COMMUNITY): Payer: 59 | Attending: Orthopedic Surgery | Admitting: Occupational Therapy

## 2023-01-06 ENCOUNTER — Encounter: Payer: Self-pay | Admitting: Physician Assistant

## 2023-01-06 ENCOUNTER — Encounter (HOSPITAL_COMMUNITY): Payer: Self-pay | Admitting: Occupational Therapy

## 2023-01-06 VITALS — BP 171/94 | HR 73 | Temp 97.9°F | Resp 18

## 2023-01-06 DIAGNOSIS — M810 Age-related osteoporosis without current pathological fracture: Secondary | ICD-10-CM | POA: Diagnosis not present

## 2023-01-06 DIAGNOSIS — M25611 Stiffness of right shoulder, not elsewhere classified: Secondary | ICD-10-CM | POA: Insufficient documentation

## 2023-01-06 DIAGNOSIS — M25511 Pain in right shoulder: Secondary | ICD-10-CM | POA: Diagnosis present

## 2023-01-06 DIAGNOSIS — R29898 Other symptoms and signs involving the musculoskeletal system: Secondary | ICD-10-CM | POA: Diagnosis present

## 2023-01-06 MED ORDER — DENOSUMAB 60 MG/ML ~~LOC~~ SOSY
60.0000 mg | PREFILLED_SYRINGE | Freq: Once | SUBCUTANEOUS | Status: AC
  Administered 2023-01-06: 60 mg via SUBCUTANEOUS

## 2023-01-06 NOTE — Telephone Encounter (Signed)
 Dr. Dallas Schimke pt - spoke w/Cassie w/[facility] [phone], she wanted to make [place] aware that they are not treating the patient there because they are not getting any response from the pt to schedule.

## 2023-01-06 NOTE — Therapy (Signed)
 OUTPATIENT OCCUPATIONAL THERAPY ORTHO TREATMENT  Patient Name: Tammy Jensen MRN: [id number] DOB:Jan 10, 1950, 73 y.o., female Today's Date: 01/06/2023   END OF SESSION:  OT End of Session - 01/06/23 0947     Visit Number 8    Number of Visits 12    Date for OT Re-Evaluation 01/10/23    Authorization Type [employer] Dual Complete    Authorization Time Period no auth required    Progress Note Due on Visit 10    OT Start Time 0903    OT Stop Time 0947    OT Time Calculation (min) 44 min    Activity Tolerance Patient tolerated treatment well    Behavior During Therapy WFL for tasks assessed/performed             Past Medical History:  Diagnosis Date   Dysrhythmia    High cholesterol    Hypertension    IBS (irritable bowel syndrome)    Rheumatoid arthritis (HCC)    Tachycardia    Past Surgical History:  Procedure Laterality Date   BIOPSY  04/14/2022   Procedure: BIOPSY;  Surgeon: Eartha Angelia Sieving, MD;  Location: [facility];  Service: Gastroenterology;;   COLONOSCOPY N/A 10/07/2016   Procedure: COLONOSCOPY;  Surgeon: Golda Claudis PENNER, MD;  Location: [facility];  Service: Endoscopy;  Laterality: N/A;  730   COLONOSCOPY WITH PROPOFOL  N/A 04/14/2022   Procedure: COLONOSCOPY WITH PROPOFOL ;  Surgeon: Eartha Angelia Sieving, MD;  Location: [facility];  Service: Gastroenterology;  Laterality: N/A;  915am, asa 2   POLYPECTOMY  04/14/2022   Procedure: POLYPECTOMY;  Surgeon: Eartha Angelia Sieving, MD;  Location: [facility];  Service: Gastroenterology;;   RECONSTRUCTION OF EYELID  09/02/2021   REVERSE SHOULDER ARTHROPLASTY Right 11/01/2022   Procedure: RIGHT REVERSE SHOULDER ARTHROPLASTY;  Surgeon: Onesimo Oneil LABOR, MD;  Location: [facility];  Service: Orthopedics;  Laterality: Right;  RNFA NEEDED   TUBAL LIGATION     VAGINAL HYSTERECTOMY N/A 10/07/2021   Procedure: HYSTERECTOMY VAGINAL;  Surgeon: Jayne Vonn DEL, MD;  Location: [facility];  Service: Gynecology;   Laterality: N/A;   Patient Active Problem List   Diagnosis Date Noted   Rotator cuff arthropathy of right shoulder 11/01/2022   Constipation 03/11/2022   Liver hemangioma 03/11/2022   Mixed hyperlipidemia 01/29/2021   Rheumatoid arteritis (HCC) 06/26/2020   Pernicious anemia 06/26/2020   Mild valvular heart disease 06/26/2020   Rheumatoid arthritis with rheumatoid factor of multiple sites without organ or systems involvement (HCC) 09/06/2019   High risk medication use 09/06/2019   Elevated LFTs 09/06/2019   Age-related osteoporosis without current pathological fracture 09/06/2019   Dyslipidemia 08/14/2019   Abnormal electrocardiogram 03/12/2019   Chest pain 03/12/2019   HTN (hypertension) 03/12/2019   Obesity 03/12/2019   Family history of colon cancer 05/28/2016   PCP: Dr. Mariano Lindau REFERRING PROVIDER: Dr. Oneil Onesimo  ONSET DATE: 11/01/22  REFERRING DIAG: S03.388 (ICD-10-CM) - Status post reverse arthroplasty of right shoulder   THERAPY DIAG:  Acute pain of right shoulder  Stiffness of right shoulder, not elsewhere classified  Other symptoms and signs involving the musculoskeletal system  Rationale for Evaluation and Treatment: Rehabilitation  SUBJECTIVE:   SUBJECTIVE STATEMENT: S: My back has really been bothering me today.     PERTINENT HISTORY: Pt is a 73 y/o female s/p right reverse TSA on 11/01/2022. Pt presents in sling without abduction pillow. Has been completing HEP given by MD.   PRECAUTIONS: Shoulder See protocol.  WEIGHT BEARING RESTRICTIONS: Yes NWB  PAIN:  Are you having pain? No   FALLS: Has patient fallen in last 6 months? No  PLOF: Independent  PATIENT GOALS: To be able to use her arm.   NEXT MD VISIT: 01/26/23  OBJECTIVE:  Note: Objective measures were completed at Evaluation unless otherwise noted.  HAND DOMINANCE: Left  ADLs: Overall ADLs: Pt is having difficulty with dressing, bathing, reaching up to wash and fix her  hair. Pt is unable to reach overhead or behind back. Pt has difficulty with lifting tasks such as meal preparation and housework. Pt is sleeping in a lift chair, has tried the bed a couple of nights but is more sore in the mornings.    FUNCTIONAL OUTCOME MEASURES: FOTO: 51/100  UPPER EXTREMITY ROM:     Passive ROM Right eval  Shoulder flexion 120  Shoulder abduction 121  Shoulder internal rotation 90  Shoulder external rotation 20  (Blank rows = not tested)  Active ROM Right eval  Shoulder flexion 83  Shoulder abduction 88  Shoulder internal rotation 90  Shoulder external rotation 20  (Blank rows = not tested)   UPPER EXTREMITY MMT:     MMT Right eval  Shoulder flexion 3-/5  Shoulder abduction 3-/5  Shoulder internal rotation 3/5  Shoulder external rotation 3-/5  (Blank rows = not tested)   EDEMA: occasional ice pack use for swelling  OBSERVATIONS: moderate fascial restrictions in right upper arm, anterior shoulder, trapezius, and scapular regions   TODAY'S TREATMENT:                                                                                                                              DATE:  01/06/23 -myofascial release to right upper arm, anterior shoulder, trapezius, and scapular regions to decrease pain and fascial restrictions and increase joint ROM.  -A/ROM: supine-protraction, flexion, horizontal abduction, er, abduction, x12 -Proximal shoulder strengthening: sitting-paddles, criss cross, circles each direction, x12 -A/ROM: sitting, 1# -protraction, flexion, horizontal abduction, er, abduction, x10 -X to V arms, x10 -Goal Post arms, x10 -Overhead lacing -Scapular strengthening: green band-row, extension, retraction, x15  12/31/22 -myofascial release to right upper arm, anterior shoulder, trapezius, and scapular regions to decrease pain and fascial restrictions and increase joint ROM.  -P/ROM: supine-flexion, abduction, er, horizontal abduction, 5 reps   -A/ROM: supine-protraction, flexion, horizontal abduction, er, abduction, 10 reps -A/ROM: standing-protraction, flexion, horizontal abduction, er, abduction, 10 reps -Proximal shoulder strengthening: standing-paddles, criss cross, circles each direction, 10 reps -Ball on wall: rolling yellow ball up and down wall 5x 2 sets  -Overhead lacing: seated-lacing from top down then reversing -UBE: level 1, 3' forward 3' reverse, pace: 5.5  12/30/22 -myofascial release to right upper arm, anterior shoulder, trapezius, and scapular regions to decrease pain and fascial restrictions and increase joint ROM.  -P/ROM: supine-flexion, abduction, er, horizontal abduction, 5 reps  -A/ROM: supine-protraction, flexion, horizontal abduction, er, abduction, 10 reps -Proximal shoulder strengthening: supine-paddles, criss  cross, circles each direction, 10 reps -A/ROM: standing-protraction, flexion, horizontal abduction, er, abduction, 10 reps -Proximal shoulder strengthening: standing-paddles, criss cross, circles each direction, 10 reps -Wall wash: 1' flexion -Proximal shoulder strengthening on doorway-1', 90 degrees flexion -Scapular strengthening: red band-row, extension, retraction 10 reps -Overhead lacing: seated-lacing from top down then reversing -Functional reaching: pt placing 10 cones on top shelf of overhead cabinet in flexion, then removing -UBE: level 1, 3' forward 3' reverse, pace: 5.5   PATIENT EDUCATION: Education details: increasing scap strengthening to green band, add 1-2# weights for shoulder ROM Person educated: Patient Education method: Explanation, Demonstration, and Handouts Education comprehension: verbalized understanding and returned demonstration  HOME EXERCISE PROGRAM: Eval:  table slides; scapular A/ROM 12/4: AA/ROM 12/20: Scapular strengthening-red band 12/26: A/ROM  1/2: 1-2# weights with shoulder A/ROM  GOALS: Goals reviewed with patient? Yes  SHORT TERM GOALS: Target  date: 12/20/22  Pt will be provided with and educated on HEP to improve mobility in RUE required for use during ADL completion.   Goal status: IN PROGRESS  2.  Pt will increase RUE P/ROM by 10+ degrees to improve ability to use RUE during dressing tasks with minimal compensatory techniques.   Goal status: IN PROGRESS   LONG TERM GOALS: Target date: 01/10/23  Pt will decrease pain in RUE to 3/10 or less to improve ability to sleep for 2+ consecutive hours without waking due to pain.   Goal status: IN PROGRESS  2.  Pt will decrease RUE fascial restrictions to min amounts or less to improve mobility required for functional reaching tasks.   Goal status: IN PROGRESS  3.  Pt will increase RUE A/ROM by 40+ degrees to improve ability to use RUE when reaching overhead or behind back during dressing and bathing tasks.   Goal status: IN PROGRESS  4.  Pt will increase RUE strength to 4+/5 or greater to improve ability to use RUE when lifting or carrying items during meal preparation/housework/yardwork tasks.   Goal status: IN PROGRESS   ASSESSMENT:  CLINICAL IMPRESSION: This session, pt continues to have minimal pain. She is able to complete all exercises and tasks with no pain or discomfort noted, as well as no rest breaks needed. She was able to upgrade to A/ROM with 1# weights today, as well as green bands for scapular strengthening. OT providing verbal and tactile cuing for positioning and technique throughout session.   PERFORMANCE DEFICITS: in functional skills including ADLs, IADLs, edema, ROM, strength, pain, fascial restrictions, and UE functional use     PLAN:  OT FREQUENCY: 2x/week  OT DURATION: 6 weeks  PLANNED INTERVENTIONS: 97168 OT Re-evaluation, 97535 self care/ADL training, 02889 therapeutic exercise, 97530 therapeutic activity, 97140 manual therapy, 97035 ultrasound, 97010 moist heat, 97010 cryotherapy, 97014 electrical stimulation unattended, patient/family  education, and DME and/or AE instructions  CONSULTED AND AGREED WITH PLAN OF CARE: Patient  PLAN FOR NEXT SESSION: Follow up on HEP, continue with scapular theraband, add shoulder stretches and ball on wall   Valentin Nightingale, OTR/L  [phone] 01/06/2023, 11:05 AM

## 2023-01-06 NOTE — Progress Notes (Signed)
 Diagnosis: Osteoporosis  Provider:  Cheryl Birmingham PA-C  Procedure: Injection  Prolia  (Denosumab ), Dose: 60 mg, Site: subcutaneous, Number of injections: 1  Injection Site(s): Right upper quad. abdomen  Post Care: Observation period completed  Discharge: Condition: Good, Destination: Home . AVS Provided  Performed by:  Baldwin Darice Helling, RN

## 2023-01-06 NOTE — Telephone Encounter (Signed)
 Pt has been receiving therapy with [facility] facility.

## 2023-01-07 ENCOUNTER — Ambulatory Visit (HOSPITAL_COMMUNITY): Payer: 59 | Admitting: Occupational Therapy

## 2023-01-07 ENCOUNTER — Encounter (HOSPITAL_COMMUNITY): Payer: Self-pay | Admitting: Occupational Therapy

## 2023-01-07 DIAGNOSIS — M25511 Pain in right shoulder: Secondary | ICD-10-CM

## 2023-01-07 DIAGNOSIS — M25611 Stiffness of right shoulder, not elsewhere classified: Secondary | ICD-10-CM

## 2023-01-07 DIAGNOSIS — R29898 Other symptoms and signs involving the musculoskeletal system: Secondary | ICD-10-CM

## 2023-01-07 NOTE — Therapy (Signed)
 OUTPATIENT OCCUPATIONAL THERAPY ORTHO TREATMENT REASSESSMENT/RECERTIFICATION  Patient Name: Tammy Jensen MRN: [id number] DOB:April 05, 1950, 73 y.o., female Today's Date: 01/07/2023  Progress Note Reporting Period 11/29/22 to 01/07/23  See note below for Objective Data and Assessment of Progress/Goals.    END OF SESSION:  OT End of Session - 01/07/23 1021     Visit Number 9    Number of Visits 12    Date for OT Re-Evaluation 01/10/23    Authorization Type [place] Medicaid    Progress Note Due on Visit 10    OT Start Time [phone]    OT Stop Time 1021    OT Time Calculation (min) 48 min    Activity Tolerance Patient tolerated treatment well    Behavior During Therapy WFL for tasks assessed/performed             Past Medical History:  Diagnosis Date   Dysrhythmia    High cholesterol    Hypertension    IBS (irritable bowel syndrome)    Rheumatoid arthritis (HCC)    Tachycardia    Past Surgical History:  Procedure Laterality Date   BIOPSY  04/14/2022   Procedure: BIOPSY;  Surgeon: Eartha Angelia Sieving, MD;  [facility];  Service: Gastroenterology;;   COLONOSCOPY N/A 10/07/2016   Procedure: COLONOSCOPY;  Surgeon: Golda Claudis PENNER, MD;  [facility]: [facility];  Service: Endoscopy;  Laterality: N/A;  730   COLONOSCOPY WITH PROPOFOL  N/A 04/14/2022   Procedure: COLONOSCOPY WITH PROPOFOL ;  Surgeon: Eartha Angelia Sieving, MD;  [facility]: [facility];  Service: Gastroenterology;  Laterality: N/A;  915am, asa 2   POLYPECTOMY  04/14/2022   Procedure: POLYPECTOMY;  Surgeon: Eartha Angelia Sieving, MD;  [facility];  Service: Gastroenterology;;   RECONSTRUCTION OF EYELID  09/02/2021   REVERSE SHOULDER ARTHROPLASTY Right 11/01/2022   Procedure: RIGHT REVERSE SHOULDER ARTHROPLASTY;  Surgeon: Onesimo Oneil LABOR, MD;  Location: [facility];  Service: Orthopedics;  Laterality: Right;  RNFA NEEDED   TUBAL LIGATION     VAGINAL HYSTERECTOMY N/A 10/07/2021   Procedure:  HYSTERECTOMY VAGINAL;  Surgeon: Jayne Vonn DEL, MD;  Location: [facility];  Service: Gynecology;  Laterality: N/A;   Patient Active Problem List   Diagnosis Date Noted   Rotator cuff arthropathy of right shoulder 11/01/2022   Constipation 03/11/2022   Liver hemangioma 03/11/2022   Mixed hyperlipidemia 01/29/2021   Rheumatoid arteritis (HCC) 06/26/2020   Pernicious anemia 06/26/2020   Mild valvular heart disease 06/26/2020   Rheumatoid arthritis with rheumatoid factor of multiple sites without organ or systems involvement (HCC) 09/06/2019   High risk medication use 09/06/2019   Elevated LFTs 09/06/2019   Age-related osteoporosis without current pathological fracture 09/06/2019   Dyslipidemia 08/14/2019   Abnormal electrocardiogram 03/12/2019   Chest pain 03/12/2019   HTN (hypertension) 03/12/2019   Obesity 03/12/2019   Family history of colon cancer 05/28/2016   PCP: Dr. Mariano Lindau REFERRING PROVIDER: Dr. Oneil Onesimo  ONSET DATE: 11/01/22  REFERRING DIAG: S03.388 (ICD-10-CM) - Status post reverse arthroplasty of right shoulder   THERAPY DIAG:  Acute pain of right shoulder  Stiffness of right shoulder, not elsewhere classified  Other symptoms and signs involving the musculoskeletal system  Rationale for Evaluation and Treatment: Rehabilitation  SUBJECTIVE:   SUBJECTIVE STATEMENT: S: I'm feeling better today    PERTINENT HISTORY: Pt is a 73 y/o female s/p right reverse TSA on 11/01/2022. Pt presents in sling without abduction pillow. Has been completing HEP given by MD.   PRECAUTIONS:  Shoulder See protocol.   WEIGHT BEARING RESTRICTIONS: Yes NWB  PAIN:  Are you having pain? No   FALLS: Has patient fallen in last 6 months? No  PLOF: Independent  PATIENT GOALS: To be able to use her arm.   NEXT MD VISIT: 01/26/23  OBJECTIVE:  Note: Objective measures were completed at Evaluation unless otherwise noted.  HAND DOMINANCE: Left  ADLs: Overall ADLs: Pt is  having difficulty with dressing, bathing, reaching up to wash and fix her hair. Pt is unable to reach overhead or behind back. Pt has difficulty with lifting tasks such as meal preparation and housework. Pt is sleeping in a lift chair, has tried the bed a couple of nights but is more sore in the mornings.    FUNCTIONAL OUTCOME MEASURES: FOTO: 51/100 01/07/23: FOTO: 65.67/100  UPPER EXTREMITY ROM:     Passive ROM Right eval  Shoulder flexion 120  Shoulder abduction 121  Shoulder internal rotation 90  Shoulder external rotation 20  (Blank rows = not tested)  Active ROM Right eval Right 01/07/23  Shoulder flexion 83 125  Shoulder abduction 88 156  Shoulder internal rotation 90 90  Shoulder external rotation 20 55  (Blank rows = not tested)   UPPER EXTREMITY MMT:     MMT Right eval Right 01/07/23  Shoulder flexion 3-/5 4/5  Shoulder abduction 3-/5 4-/5  Shoulder internal rotation 3/5 4-/5  Shoulder external rotation 3-/5 3+/5  (Blank rows = not tested)   EDEMA: occasional ice pack use for swelling  OBSERVATIONS: moderate fascial restrictions in right upper arm, anterior shoulder, trapezius, and scapular regions   TODAY'S TREATMENT:                                                                                                                              DATE:  01/07/23 -myofascial release to right upper arm, anterior shoulder, trapezius, and scapular regions to decrease pain and fascial restrictions and increase joint ROM.  -A/ROM: seated-protraction, flexion, horizontal abduction, er, abduction, x15 -X to V arms, x10 -Goal Post arms, x10 -PNF Strengthening: red band, chest pulls, overhead pulls, er pulls, PNF up, PNF down, x12 -Reassessment for recertification  01/06/23 -myofascial release to right upper arm, anterior shoulder, trapezius, and scapular regions to decrease pain and fascial restrictions and increase joint ROM.  -A/ROM: supine-protraction, flexion, horizontal  abduction, er, abduction, x12 -Proximal shoulder strengthening: sitting-paddles, criss cross, circles each direction, x12 -A/ROM: sitting, 1# -protraction, flexion, horizontal abduction, er, abduction, x10 -X to V arms, x10 -Goal Post arms, x10 -Overhead lacing -Scapular strengthening: green band-row, extension, retraction, x15  12/31/22 -myofascial release to right upper arm, anterior shoulder, trapezius, and scapular regions to decrease pain and fascial restrictions and increase joint ROM.  -P/ROM: supine-flexion, abduction, er, horizontal abduction, 5 reps  -A/ROM: supine-protraction, flexion, horizontal abduction, er, abduction, 10 reps -A/ROM: standing-protraction, flexion, horizontal abduction, er, abduction, 10 reps -Proximal shoulder strengthening: standing-paddles, criss cross, circles each direction, 10  reps -Ball on wall: rolling yellow ball up and down wall 5x 2 sets  -Overhead lacing: seated-lacing from top down then reversing -UBE: level 1, 3' forward 3' reverse, pace: 5.5   PATIENT EDUCATION: Education details: PNF Strengthening Person educated: Patient Education method: Explanation, Demonstration, and Handouts Education comprehension: verbalized understanding and returned demonstration  HOME EXERCISE PROGRAM: Eval:  table slides; scapular A/ROM 12/4: AA/ROM 12/20: Scapular strengthening-red band 12/26: A/ROM  1/2: 1-2# weights with shoulder A/ROM 1/3: PNF Strengthening  GOALS: Goals reviewed with patient? Yes  SHORT TERM GOALS: Target date: 12/20/22  Pt will be provided with and educated on HEP to improve mobility in RUE required for use during ADL completion.   Goal status: IN PROGRESS  2.  Pt will increase RUE P/ROM by 10+ degrees to improve ability to use RUE during dressing tasks with minimal compensatory techniques.   Goal status: IN PROGRESS   LONG TERM GOALS: Target date: 01/10/23  Pt will decrease pain in RUE to 3/10 or less to improve ability to  sleep for 2+ consecutive hours without waking due to pain.   Goal status: IN PROGRESS  2.  Pt will decrease RUE fascial restrictions to min amounts or less to improve mobility required for functional reaching tasks.   Goal status: IN PROGRESS  3.  Pt will increase RUE A/ROM by 40+ degrees to improve ability to use RUE when reaching overhead or behind back during dressing and bathing tasks.   Goal status: IN PROGRESS  4.  Pt will increase RUE strength to 4+/5 or greater to improve ability to use RUE when lifting or carrying items during meal preparation/housework/yardwork tasks.   Goal status: IN PROGRESS   ASSESSMENT:  CLINICAL IMPRESSION: This session, pt completed reassessment for recertification. She is making good progress with ROM and strength, as well as overall improved mobility. She continues to have limitations with external rotation and mild limitations with flexion. OT continuing to add strengthening and mobility tasks, which pt tolerates well with cuing for positioning and technique throughout.   PERFORMANCE DEFICITS: in functional skills including ADLs, IADLs, edema, ROM, strength, pain, fascial restrictions, and UE functional use     PLAN:  OT FREQUENCY: 2x/week  OT DURATION: 6 weeks  PLANNED INTERVENTIONS: 97168 OT Re-evaluation, 97535 self care/ADL training, 02889 therapeutic exercise, 97530 therapeutic activity, 97140 manual therapy, 97035 ultrasound, 97010 moist heat, 97010 cryotherapy, 97014 electrical stimulation unattended, patient/family education, and DME and/or AE instructions  CONSULTED AND AGREED WITH PLAN OF CARE: Patient  PLAN FOR NEXT SESSION: Follow up on HEP, continue with scapular theraband, add shoulder stretches and ball on wall   Valentin Nightingale, OTR/L  [phone] 01/07/2023, 12:00 PM

## 2023-01-07 NOTE — Patient Instructions (Signed)

## 2023-01-10 ENCOUNTER — Encounter (HOSPITAL_COMMUNITY): Payer: 59 | Admitting: Occupational Therapy

## 2023-01-12 ENCOUNTER — Encounter (HOSPITAL_COMMUNITY): Payer: Self-pay | Admitting: Occupational Therapy

## 2023-01-12 ENCOUNTER — Ambulatory Visit (HOSPITAL_COMMUNITY): Payer: 59 | Admitting: Occupational Therapy

## 2023-01-12 DIAGNOSIS — M25511 Pain in right shoulder: Secondary | ICD-10-CM

## 2023-01-12 DIAGNOSIS — R29898 Other symptoms and signs involving the musculoskeletal system: Secondary | ICD-10-CM

## 2023-01-12 DIAGNOSIS — M25611 Stiffness of right shoulder, not elsewhere classified: Secondary | ICD-10-CM

## 2023-01-12 NOTE — Patient Instructions (Signed)

## 2023-01-12 NOTE — Therapy (Signed)
 OUTPATIENT OCCUPATIONAL THERAPY ORTHO TREATMENT   Patient Name: Tammy Jensen MRN: [id number] DOB:December 25, 1950, 73 y.o., female Today's Date: 01/12/2023    END OF SESSION:  OT End of Session - 01/12/23 1145     Visit Number 10    Number of Visits 12    Date for OT Re-Evaluation 02/18/23    Authorization Type [place] Medicaid (requesting 8 visits)    Progress Note Due on Visit 20    OT Start Time 1107    OT Stop Time 1145    OT Time Calculation (min) 38 min    Activity Tolerance Patient tolerated treatment well    Behavior During Therapy WFL for tasks assessed/performed              Past Medical History:  Diagnosis Date   Dysrhythmia    High cholesterol    Hypertension    IBS (irritable bowel syndrome)    Rheumatoid arthritis (HCC)    Tachycardia    Past Surgical History:  Procedure Laterality Date   BIOPSY  04/14/2022   Procedure: BIOPSY;  Surgeon: Eartha Angelia Sieving, MD;  [facility];  Service: Gastroenterology;;   COLONOSCOPY N/A 10/07/2016   Procedure: COLONOSCOPY;  Surgeon: Golda Claudis PENNER, MD;  [facility]: [facility];  Service: Endoscopy;  Laterality: N/A;  730   COLONOSCOPY WITH PROPOFOL  N/A 04/14/2022   Procedure: COLONOSCOPY WITH PROPOFOL ;  Surgeon: Eartha Angelia Sieving, MD;  [facility]: [facility];  Service: Gastroenterology;  Laterality: N/A;  915am, asa 2   POLYPECTOMY  04/14/2022   Procedure: POLYPECTOMY;  Surgeon: Eartha Angelia Sieving, MD;  [facility]: [facility];  Service: Gastroenterology;;   RECONSTRUCTION OF EYELID  09/02/2021   REVERSE SHOULDER ARTHROPLASTY Right 11/01/2022   Procedure: RIGHT REVERSE SHOULDER ARTHROPLASTY;  Surgeon: Onesimo Oneil LABOR, MD;  Location: [facility];  Service: Orthopedics;  Laterality: Right;  RNFA NEEDED   TUBAL LIGATION     VAGINAL HYSTERECTOMY N/A 10/07/2021   Procedure: HYSTERECTOMY VAGINAL;  Surgeon: Jayne Vonn DEL, MD;  Location: [facility];  Service: Gynecology;  Laterality: N/A;   Patient Active  Problem List   Diagnosis Date Noted   Rotator cuff arthropathy of right shoulder 11/01/2022   Constipation 03/11/2022   Liver hemangioma 03/11/2022   Mixed hyperlipidemia 01/29/2021   Rheumatoid arteritis (HCC) 06/26/2020   Pernicious anemia 06/26/2020   Mild valvular heart disease 06/26/2020   Rheumatoid arthritis with rheumatoid factor of multiple sites without organ or systems involvement (HCC) 09/06/2019   High risk medication use 09/06/2019   Elevated LFTs 09/06/2019   Age-related osteoporosis without current pathological fracture 09/06/2019   Dyslipidemia 08/14/2019   Abnormal electrocardiogram 03/12/2019   Chest pain 03/12/2019   HTN (hypertension) 03/12/2019   Obesity 03/12/2019   Family history of colon cancer 05/28/2016   PCP: Dr. Mariano Lindau REFERRING PROVIDER: Dr. Oneil Onesimo  ONSET DATE: 11/01/22  REFERRING DIAG: S03.388 (ICD-10-CM) - Status post reverse arthroplasty of right shoulder   THERAPY DIAG:  Acute pain of right shoulder  Stiffness of right shoulder, not elsewhere classified  Other symptoms and signs involving the musculoskeletal system  Rationale for Evaluation and Treatment: Rehabilitation  SUBJECTIVE:   SUBJECTIVE STATEMENT: S: I feel weak today.   PERTINENT HISTORY: Pt is a 73 y/o female s/p right reverse TSA on 11/01/2022. Pt presents in sling without abduction pillow. Has been completing HEP given by MD.   PRECAUTIONS: Shoulder See protocol.   WEIGHT BEARING RESTRICTIONS: Yes NWB  PAIN:  Are you having  pain? No   FALLS: Has patient fallen in last 6 months? No  PLOF: Independent  PATIENT GOALS: To be able to use her arm.   NEXT MD VISIT: 01/26/23  OBJECTIVE:  Note: Objective measures were completed at Evaluation unless otherwise noted.  HAND DOMINANCE: Left  ADLs: Overall ADLs: Pt is having difficulty with dressing, bathing, reaching up to wash and fix her hair. Pt is unable to reach overhead or behind back. Pt has  difficulty with lifting tasks such as meal preparation and housework. Pt is sleeping in a lift chair, has tried the bed a couple of nights but is more sore in the mornings.    FUNCTIONAL OUTCOME MEASURES: FOTO: 51/100 01/07/23: FOTO: 65.67/100  UPPER EXTREMITY ROM:     Passive ROM Right eval  Shoulder flexion 120  Shoulder abduction 121  Shoulder internal rotation 90  Shoulder external rotation 20  (Blank rows = not tested)  Active ROM Right eval Right 01/07/23  Shoulder flexion 83 125  Shoulder abduction 88 156  Shoulder internal rotation 90 90  Shoulder external rotation 20 55  (Blank rows = not tested)   UPPER EXTREMITY MMT:     MMT Right eval Right 01/07/23  Shoulder flexion 3-/5 4/5  Shoulder abduction 3-/5 4-/5  Shoulder internal rotation 3/5 4-/5  Shoulder external rotation 3-/5 3+/5  (Blank rows = not tested)   EDEMA: occasional ice pack use for swelling  OBSERVATIONS: moderate fascial restrictions in right upper arm, anterior shoulder, trapezius, and scapular regions   TODAY'S TREATMENT:                                                                                                                              DATE:  01/12/23 -myofascial release to right upper arm, anterior shoulder, trapezius, and scapular regions to decrease pain and fascial restrictions and increase joint ROM.  -A/ROM: seated-protraction, flexion, horizontal abduction, er, abduction, x15 -X to V arms, x10 -Goal Post arms, x10 -Proximal Shoulder Exercises: paddles, criss cross, circles both directions, x15 -Stretches: flexion, er on the wall, towel er behind the back, corner stretch, 4x15  01/07/23 -myofascial release to right upper arm, anterior shoulder, trapezius, and scapular regions to decrease pain and fascial restrictions and increase joint ROM.  -A/ROM: seated-protraction, flexion, horizontal abduction, er, abduction, x15 -X to V arms, x10 -Goal Post arms, x10 -PNF Strengthening:  red band, chest pulls, overhead pulls, er pulls, PNF up, PNF down, x12 -Reassessment for recertification  01/06/23 -myofascial release to right upper arm, anterior shoulder, trapezius, and scapular regions to decrease pain and fascial restrictions and increase joint ROM.  -A/ROM: supine-protraction, flexion, horizontal abduction, er, abduction, x12 -Proximal shoulder strengthening: sitting-paddles, criss cross, circles each direction, x12 -A/ROM: sitting, 1# -protraction, flexion, horizontal abduction, er, abduction, x10 -X to V arms, x10 -Goal Post arms, x10 -Overhead lacing -Scapular strengthening: green band-row, extension, retraction, x15   PATIENT EDUCATION: Education details: Museum/gallery Conservator Person educated: Patient Education  method: Explanation, Demonstration, and Handouts Education comprehension: verbalized understanding and returned demonstration  HOME EXERCISE PROGRAM: Eval:  table slides; scapular A/ROM 12/4: AA/ROM 12/20: Scapular strengthening-red band 12/26: A/ROM  1/2: 1-2# weights with shoulder A/ROM 1/3: PNF Strengthening 1/8: Shoulder Stretches  GOALS: Goals reviewed with patient? Yes  SHORT TERM GOALS: Target date: 12/20/22  Pt will be provided with and educated on HEP to improve mobility in RUE required for use during ADL completion.   Goal status: IN PROGRESS  2.  Pt will increase RUE P/ROM by 10+ degrees to improve ability to use RUE during dressing tasks with minimal compensatory techniques.   Goal status: MET   LONG TERM GOALS: Target date: 01/10/23  Pt will decrease pain in RUE to 3/10 or less to improve ability to sleep for 2+ consecutive hours without waking due to pain.   Goal status: IN PROGRESS  2.  Pt will decrease RUE fascial restrictions to min amounts or less to improve mobility required for functional reaching tasks.   Goal status: IN PROGRESS  3.  Pt will increase RUE A/ROM by 40+ degrees to improve ability to use RUE when  reaching overhead or behind back during dressing and bathing tasks.   Goal status: IN PROGRESS  4.  Pt will increase RUE strength to 4+/5 or greater to improve ability to use RUE when lifting or carrying items during meal preparation/housework/yardwork tasks.   Goal status: IN PROGRESS   ASSESSMENT:  CLINICAL IMPRESSION: This session, pt continuing to work on overall ROM and strengthening. She continues to have minimal pain and improving mobility. OT added shoulder stretches this session to improve ability to reach overhead and behind back. Verbal and tactile cuing provided for positioning and technique throughout session.   PERFORMANCE DEFICITS: in functional skills including ADLs, IADLs, edema, ROM, strength, pain, fascial restrictions, and UE functional use     PLAN:  OT FREQUENCY: 2x/week  OT DURATION: 6 weeks  PLANNED INTERVENTIONS: 97168 OT Re-evaluation, 97535 self care/ADL training, 02889 therapeutic exercise, 97530 therapeutic activity, 97140 manual therapy, 97035 ultrasound, 97010 moist heat, 97010 cryotherapy, 97014 electrical stimulation unattended, patient/family education, and DME and/or AE instructions  CONSULTED AND AGREED WITH PLAN OF CARE: Patient  PLAN FOR NEXT SESSION: Follow up on HEP, continue with scapular theraband, add shoulder stretches and ball on wall   Valentin Nightingale, OTR/L  [phone] 01/12/2023, 12:55 PM

## 2023-01-13 ENCOUNTER — Encounter: Payer: Self-pay | Admitting: Physician Assistant

## 2023-01-14 ENCOUNTER — Ambulatory Visit (HOSPITAL_COMMUNITY): Payer: 59 | Admitting: Occupational Therapy

## 2023-01-14 ENCOUNTER — Encounter (HOSPITAL_COMMUNITY): Payer: Self-pay | Admitting: Occupational Therapy

## 2023-01-14 DIAGNOSIS — M25511 Pain in right shoulder: Secondary | ICD-10-CM

## 2023-01-14 DIAGNOSIS — R29898 Other symptoms and signs involving the musculoskeletal system: Secondary | ICD-10-CM

## 2023-01-14 DIAGNOSIS — M25611 Stiffness of right shoulder, not elsewhere classified: Secondary | ICD-10-CM

## 2023-01-14 NOTE — Therapy (Signed)
 OUTPATIENT OCCUPATIONAL THERAPY ORTHO TREATMENT   Patient Name: Tammy Jensen MRN: [id number] DOB:Jan 09, 1950, 73 y.o., female Today's Date: 01/14/2023    END OF SESSION:  OT End of Session - 01/14/23 1008     Visit Number 11    Number of Visits 12    Date for OT Re-Evaluation 02/18/23    Authorization Type [place] Medicaid (requesting 8 visits)    Progress Note Due on Visit 20    OT Start Time [phone]    OT Stop Time 1008    OT Time Calculation (min) 40 min    Activity Tolerance Patient tolerated treatment well    Behavior During Therapy WFL for tasks assessed/performed               Past Medical History:  Diagnosis Date   Dysrhythmia    High cholesterol    Hypertension    IBS (irritable bowel syndrome)    Rheumatoid arthritis (HCC)    Tachycardia    Past Surgical History:  Procedure Laterality Date   BIOPSY  04/14/2022   Procedure: BIOPSY;  Surgeon: Eartha Angelia Sieving, MD;  [facility];  Service: Gastroenterology;;   COLONOSCOPY N/A 10/07/2016   Procedure: COLONOSCOPY;  Surgeon: Golda Claudis PENNER, MD;  [facility];  Service: Endoscopy;  Laterality: N/A;  730   COLONOSCOPY WITH PROPOFOL  N/A 04/14/2022   Procedure: COLONOSCOPY WITH PROPOFOL ;  Surgeon: Eartha Angelia Sieving, MD;  [facility];  Service: Gastroenterology;  Laterality: N/A;  915am, asa 2   POLYPECTOMY  04/14/2022   Procedure: POLYPECTOMY;  Surgeon: Eartha Angelia Sieving, MD;  [facility];  Service: Gastroenterology;;   RECONSTRUCTION OF EYELID  09/02/2021   REVERSE SHOULDER ARTHROPLASTY Right 11/01/2022   Procedure: RIGHT REVERSE SHOULDER ARTHROPLASTY;  Surgeon: Onesimo Oneil LABOR, MD;  Location: [facility];  Service: Orthopedics;  Laterality: Right;  RNFA NEEDED   TUBAL LIGATION     VAGINAL HYSTERECTOMY N/A 10/07/2021   Procedure: HYSTERECTOMY VAGINAL;  Surgeon: Jayne Vonn DEL, MD;  Location: [facility];  Service: Gynecology;  Laterality: N/A;   Patient  Active Problem List   Diagnosis Date Noted   Rotator cuff arthropathy of right shoulder 11/01/2022   Constipation 03/11/2022   Liver hemangioma 03/11/2022   Mixed hyperlipidemia 01/29/2021   Rheumatoid arteritis (HCC) 06/26/2020   Pernicious anemia 06/26/2020   Mild valvular heart disease 06/26/2020   Rheumatoid arthritis with rheumatoid factor of multiple sites without organ or systems involvement (HCC) 09/06/2019   High risk medication use 09/06/2019   Elevated LFTs 09/06/2019   Age-related osteoporosis without current pathological fracture 09/06/2019   Dyslipidemia 08/14/2019   Abnormal electrocardiogram 03/12/2019   Chest pain 03/12/2019   HTN (hypertension) 03/12/2019   Obesity 03/12/2019   Family history of colon cancer 05/28/2016   PCP: Dr. Mariano Lindau REFERRING PROVIDER: Dr. Oneil Onesimo  ONSET DATE: 11/01/22  REFERRING DIAG: S03.388 (ICD-10-CM) - Status post reverse arthroplasty of right shoulder   THERAPY DIAG:  Acute pain of right shoulder  Stiffness of right shoulder, not elsewhere classified  Other symptoms and signs involving the musculoskeletal system  Rationale for Evaluation and Treatment: Rehabilitation  SUBJECTIVE:   SUBJECTIVE STATEMENT: S: I feel weak today.   PERTINENT HISTORY: Pt is a 73 y/o female s/p right reverse TSA on 11/01/2022. Pt presents in sling without abduction pillow. Has been completing HEP given by MD.   PRECAUTIONS: Shoulder See protocol.   WEIGHT BEARING RESTRICTIONS: Yes NWB  PAIN:  Are you  having pain? No   FALLS: Has patient fallen in last 6 months? No  PLOF: Independent  PATIENT GOALS: To be able to use her arm.   NEXT MD VISIT: 01/26/23  OBJECTIVE:  Note: Objective measures were completed at Evaluation unless otherwise noted.  HAND DOMINANCE: Left  ADLs: Overall ADLs: Pt is having difficulty with dressing, bathing, reaching up to wash and fix her hair. Pt is unable to reach overhead or behind back. Pt has  difficulty with lifting tasks such as meal preparation and housework. Pt is sleeping in a lift chair, has tried the bed a couple of nights but is more sore in the mornings.    FUNCTIONAL OUTCOME MEASURES: FOTO: 51/100 01/07/23: FOTO: 65.67/100  UPPER EXTREMITY ROM:     Passive ROM Right eval  Shoulder flexion 120  Shoulder abduction 121  Shoulder internal rotation 90  Shoulder external rotation 20  (Blank rows = not tested)  Active ROM Right eval Right 01/07/23  Shoulder flexion 83 125  Shoulder abduction 88 156  Shoulder internal rotation 90 90  Shoulder external rotation 20 55  (Blank rows = not tested)   UPPER EXTREMITY MMT:     MMT Right eval Right 01/07/23  Shoulder flexion 3-/5 4/5  Shoulder abduction 3-/5 4-/5  Shoulder internal rotation 3/5 4-/5  Shoulder external rotation 3-/5 3+/5  (Blank rows = not tested)   EDEMA: occasional ice pack use for swelling  OBSERVATIONS: moderate fascial restrictions in right upper arm, anterior shoulder, trapezius, and scapular regions   TODAY'S TREATMENT:                                                                                                                              DATE:  01/14/23 -myofascial release to right upper arm, anterior shoulder, trapezius, and scapular regions to decrease pain and fascial restrictions and increase joint ROM.  -Shoulder Strengthening: 2#, flexion, abduction, protraction, horizontal abduction, er/IR, x10 -X to V arms, 2#, x10 -Goal Post arms, 2#, x10 -Overhead lacing -functional reaching: 2#, flexion shoulder height, head height, and overhead shelves, x10, 1# in abduction, shoulder height and head height shelf, x10 -UBE: level 3, 2.5' forwards and backwards  01/12/23 -myofascial release to right upper arm, anterior shoulder, trapezius, and scapular regions to decrease pain and fascial restrictions and increase joint ROM.  -A/ROM: seated-protraction, flexion, horizontal abduction, er,  abduction, x15 -X to V arms, x10 -Goal Post arms, x10 -Proximal Shoulder Exercises: paddles, criss cross, circles both directions, x15 -Stretches: flexion, er on the wall, towel er behind the back, corner stretch, 4x15  01/07/23 -myofascial release to right upper arm, anterior shoulder, trapezius, and scapular regions to decrease pain and fascial restrictions and increase joint ROM.  -A/ROM: seated-protraction, flexion, horizontal abduction, er, abduction, x15 -X to V arms, x10 -Goal Post arms, x10 -PNF Strengthening: red band, chest pulls, overhead pulls, er pulls, PNF up, PNF down, x12 -Reassessment for recertification   PATIENT EDUCATION: Education  details: Shoulder Stretches Person educated: Patient Education method: Explanation, Demonstration, and Handouts Education comprehension: verbalized understanding and returned demonstration  HOME EXERCISE PROGRAM: Eval:  table slides; scapular A/ROM 12/4: AA/ROM 12/20: Scapular strengthening-red band 12/26: A/ROM  1/2: 1-2# weights with shoulder A/ROM 1/3: PNF Strengthening 1/8: Shoulder Stretches  GOALS: Goals reviewed with patient? Yes  SHORT TERM GOALS: Target date: 12/20/22  Pt will be provided with and educated on HEP to improve mobility in RUE required for use during ADL completion.   Goal status: IN PROGRESS  2.  Pt will increase RUE P/ROM by 10+ degrees to improve ability to use RUE during dressing tasks with minimal compensatory techniques.   Goal status: MET   LONG TERM GOALS: Target date: 01/10/23  Pt will decrease pain in RUE to 3/10 or less to improve ability to sleep for 2+ consecutive hours without waking due to pain.   Goal status: IN PROGRESS  2.  Pt will decrease RUE fascial restrictions to min amounts or less to improve mobility required for functional reaching tasks.   Goal status: IN PROGRESS  3.  Pt will increase RUE A/ROM by 40+ degrees to improve ability to use RUE when reaching overhead or  behind back during dressing and bathing tasks.   Goal status: IN PROGRESS  4.  Pt will increase RUE strength to 4+/5 or greater to improve ability to use RUE when lifting or carrying items during meal preparation/housework/yardwork tasks.   Goal status: IN PROGRESS   ASSESSMENT:  CLINICAL IMPRESSION: Pt continuing to make good progress overall with both ROM and strength. OT increased pt to 2# for ROM this session, which increased fatigue, but did not increase pain. She also was able to start functional reaching this session with good form noted throughout. Verbal and tactile cuing provided throughout session for positioning and technique, as well as slower and smoother movements due to patients quick pace.   PERFORMANCE DEFICITS: in functional skills including ADLs, IADLs, edema, ROM, strength, pain, fascial restrictions, and UE functional use     PLAN:  OT FREQUENCY: 2x/week  OT DURATION: 6 weeks  PLANNED INTERVENTIONS: 97168 OT Re-evaluation, 97535 self care/ADL training, 02889 therapeutic exercise, 97530 therapeutic activity, 97140 manual therapy, 97035 ultrasound, 97010 moist heat, 97010 cryotherapy, 97014 electrical stimulation unattended, patient/family education, and DME and/or AE instructions  CONSULTED AND AGREED WITH PLAN OF CARE: Patient  PLAN FOR NEXT SESSION: Follow up on HEP, continue with scapular theraband, add low level shoulder theraband  Valentin Nightingale, OTR/L  [phone] 01/14/2023, 10:09 AM

## 2023-01-17 ENCOUNTER — Encounter (HOSPITAL_COMMUNITY): Payer: 59 | Admitting: Occupational Therapy

## 2023-01-19 ENCOUNTER — Ambulatory Visit (HOSPITAL_COMMUNITY): Payer: 59 | Admitting: Occupational Therapy

## 2023-01-19 ENCOUNTER — Encounter (HOSPITAL_COMMUNITY): Payer: Self-pay | Admitting: Occupational Therapy

## 2023-01-19 DIAGNOSIS — M25511 Pain in right shoulder: Secondary | ICD-10-CM

## 2023-01-19 DIAGNOSIS — R29898 Other symptoms and signs involving the musculoskeletal system: Secondary | ICD-10-CM

## 2023-01-19 DIAGNOSIS — M25611 Stiffness of right shoulder, not elsewhere classified: Secondary | ICD-10-CM

## 2023-01-19 NOTE — Therapy (Signed)
 OUTPATIENT OCCUPATIONAL THERAPY ORTHO TREATMENT   Patient Name: Tammy Jensen MRN: [id number] DOB:1950/12/27, 73 y.o., female Today's Date: 01/19/2023    END OF SESSION:  OT End of Session - 01/19/23 1037     Visit Number 12    Number of Visits 16    Date for OT Re-Evaluation 02/18/23    Authorization Type 1) [employer] Medicare Dual Complete 2) Medicaid    Authorization Time Period no auth required    Progress Note Due on Visit 20    OT Start Time 1015    OT Stop Time 1057    OT Time Calculation (min) 42 min    Activity Tolerance Patient tolerated treatment well    Behavior During Therapy WFL for tasks assessed/performed                Past Medical History:  Diagnosis Date   Dysrhythmia    High cholesterol    Hypertension    IBS (irritable bowel syndrome)    Rheumatoid arthritis (HCC)    Tachycardia    Past Surgical History:  Procedure Laterality Date   BIOPSY  04/14/2022   Procedure: BIOPSY;  Surgeon: Urban Garden, MD;  Location: [facility];  Service: Gastroenterology;;   COLONOSCOPY N/A 10/07/2016   Procedure: COLONOSCOPY;  Surgeon: Ruby Corporal, MD;  Location: [facility];  Service: Endoscopy;  Laterality: N/A;  730   COLONOSCOPY WITH PROPOFOL  N/A 04/14/2022   Procedure: COLONOSCOPY WITH PROPOFOL ;  Surgeon: Urban Garden, MD;  Location: [facility];  Service: Gastroenterology;  Laterality: N/A;  915am, asa 2   POLYPECTOMY  04/14/2022   Procedure: POLYPECTOMY;  Surgeon: Urban Garden, MD;  Location: [facility];  Service: Gastroenterology;;   RECONSTRUCTION OF EYELID  09/02/2021   REVERSE SHOULDER ARTHROPLASTY Right 11/01/2022   Procedure: RIGHT REVERSE SHOULDER ARTHROPLASTY;  Surgeon: Tonita Frater, MD;  Location: [facility];  Service: Orthopedics;  Laterality: Right;  RNFA NEEDED   TUBAL LIGATION     VAGINAL HYSTERECTOMY N/A 10/07/2021   Procedure: HYSTERECTOMY VAGINAL;  Surgeon: Wendelyn Halter, MD;  Location: [facility];  Service: Gynecology;  Laterality: N/A;   Patient Active Problem List   Diagnosis Date Noted   Rotator cuff arthropathy of right shoulder 11/01/2022   Constipation 03/11/2022   Liver hemangioma 03/11/2022   Mixed hyperlipidemia 01/29/2021   Rheumatoid arteritis (HCC) 06/26/2020   Pernicious anemia 06/26/2020   Mild valvular heart disease 06/26/2020   Rheumatoid arthritis with rheumatoid factor of multiple sites without organ or systems involvement (HCC) 09/06/2019   High risk medication use 09/06/2019   Elevated LFTs 09/06/2019   Age-related osteoporosis without current pathological fracture 09/06/2019   Dyslipidemia 08/14/2019   Abnormal electrocardiogram 03/12/2019   Chest pain 03/12/2019   HTN (hypertension) 03/12/2019   Obesity 03/12/2019   Family history of colon cancer 05/28/2016   PCP: Dr. Marnell Sinks REFERRING PROVIDER: Dr. Sharol Decamp  ONSET DATE: 11/01/22  REFERRING DIAG: J19.147 (ICD-10-CM) - Status post reverse arthroplasty of right shoulder   THERAPY DIAG:  Acute pain of right shoulder  Stiffness of right shoulder, not elsewhere classified  Other symptoms and signs involving the musculoskeletal system  Rationale for Evaluation and Treatment: Rehabilitation  SUBJECTIVE:   SUBJECTIVE STATEMENT: S: That stretch at the door really hurt.    PERTINENT HISTORY: Pt is a 73 y/o female s/p right reverse TSA on 11/01/2022. Pt presents in sling without abduction pillow. Has been completing HEP given by MD.  PRECAUTIONS: Shoulder See protocol.   WEIGHT BEARING RESTRICTIONS: Yes NWB  PAIN:  Are you having pain? Yes: NPRS scale: 3/10 Pain location: bicep area Pain description: aching, sore Aggravating factors: er stretch at doorway Relieving factors: rest    FALLS: Has patient fallen in last 6 months? No  PLOF: Independent  PATIENT GOALS: To be able to use her arm.   NEXT MD VISIT: 01/26/23  OBJECTIVE:  Note: Objective measures were completed  at Evaluation unless otherwise noted.  HAND DOMINANCE: Left  ADLs: Overall ADLs: Pt is having difficulty with dressing, bathing, reaching up to wash and fix her hair. Pt is unable to reach overhead or behind back. Pt has difficulty with lifting tasks such as meal preparation and housework. Pt is sleeping in a lift chair, has tried the bed a couple of nights but is more sore in the mornings.    FUNCTIONAL OUTCOME MEASURES: FOTO: 51/100 01/07/23: 65.67/100  UPPER EXTREMITY ROM:     Passive ROM Right eval  Shoulder flexion 120  Shoulder abduction 121  Shoulder internal rotation 90  Shoulder external rotation 20  (Blank rows = not tested)  Active ROM Right eval Right 01/07/23  Shoulder flexion 83 125  Shoulder abduction 88 156  Shoulder internal rotation 90 90  Shoulder external rotation 20 55  (Blank rows = not tested)   UPPER EXTREMITY MMT:     MMT Right eval Right 01/07/23  Shoulder flexion 3-/5 4/5  Shoulder abduction 3-/5 4-/5  Shoulder internal rotation 3/5 4-/5  Shoulder external rotation 3-/5 3+/5  (Blank rows = not tested)   EDEMA: occasional ice pack use for swelling  OBSERVATIONS: moderate fascial restrictions in right upper arm, anterior shoulder, trapezius, and scapular regions   TODAY'S TREATMENT:                                                                                                                              DATE: 01/19/23 -myofascial release to right upper arm, anterior shoulder, trapezius, and scapular regions to decrease pain and fascial restrictions and increase joint ROM.  -P/ROM: supine-flexion, abduction, er, horizontal abduction, 5 reps -Strengthening: supine, 1#-protraction, flexion, abduction, er, horizontal abduction, 10 reps -Proximal shoulder strengthening: supine-paddles, Tammy cross, circles each direction -Shoulder stretches: flexion, IR behind back with horizontal towel, cross chest stretch, doorway stretch, 2x10"  holds -Scapular theraband: red-row, extension, retraction, 10 reps -Overhead lacing: 1# wrist weight, lacing from top down then reversing -UBE: level 2, 2' reverse, pace: 5.5  01/14/23 -myofascial release to right upper arm, anterior shoulder, trapezius, and scapular regions to decrease pain and fascial restrictions and increase joint ROM.  -Shoulder Strengthening: 2#, flexion, abduction, protraction, horizontal abduction, er/IR, x10 -X to V arms, 2#, x10 -Goal Post arms, 2#, x10 -Overhead lacing -functional reaching: 2#, flexion shoulder height, head height, and overhead shelves, x10, 1# in abduction, shoulder height and head height shelf, x10 -UBE: level 3, 2.5' forwards and backwards  01/12/23 -myofascial  release to right upper arm, anterior shoulder, trapezius, and scapular regions to decrease pain and fascial restrictions and increase joint ROM.  -A/ROM: seated-protraction, flexion, horizontal abduction, er, abduction, x15 -X to V arms, x10 -Goal Post arms, x10 -Proximal Shoulder Exercises: paddles, Tammy cross, circles both directions, x15 -Stretches: flexion, er on the wall, towel er behind the back, corner stretch, 4x15"    PATIENT EDUCATION: Education details: reviewed HEP Person educated: Patient Education method: Explanation, Demonstration, and Handouts Education comprehension: verbalized understanding and returned demonstration  HOME EXERCISE PROGRAM: Eval:  table slides; scapular A/ROM 12/4: AA/ROM 12/20: Scapular strengthening-red band 12/26: A/ROM  1/2: 1-2# weights with shoulder A/ROM 1/3: PNF Strengthening 1/8: Shoulder Stretches  GOALS: Goals reviewed with patient? Yes  SHORT TERM GOALS: Target date: 12/20/22  Pt will be provided with and educated on HEP to improve mobility in RUE required for use during ADL completion.   Goal status: IN PROGRESS  2.  Pt will increase RUE P/ROM by 10+ degrees to improve ability to use RUE during dressing tasks with  minimal compensatory techniques.   Goal status: MET   LONG TERM GOALS: Target date: 01/10/23  Pt will decrease pain in RUE to 3/10 or less to improve ability to sleep for 2+ consecutive hours without waking due to pain.   Goal status: IN PROGRESS  2.  Pt will decrease RUE fascial restrictions to min amounts or less to improve mobility required for functional reaching tasks.   Goal status: IN PROGRESS  3.  Pt will increase RUE A/ROM by 40+ degrees to improve ability to use RUE when reaching overhead or behind back during dressing and bathing tasks.   Goal status: IN PROGRESS  4.  Pt will increase RUE strength to 4+/5 or greater to improve ability to use RUE when lifting or carrying items during meal preparation/housework/yardwork tasks.   Goal status: IN PROGRESS   ASSESSMENT:  CLINICAL IMPRESSION: Pt reports a pop and pain with er stretch at doorway when completing HEP at home. Instructed pt to discontinue stretch at home, only completing under supervision in clinic. Completed myofascial release focusing on bicep and upper arm region. P/ROM indicating ROM is 75%+ in all ranges. Reduced weight to 1# for gentle strengthening, pt with improved tolerance. Reviewed shoulder stretches and modified for er and flexion, did not complete er due to form. Verbal cuing for form and technique.    PERFORMANCE DEFICITS: in functional skills including ADLs, IADLs, edema, ROM, strength, pain, fascial restrictions, and UE functional use     PLAN:  OT FREQUENCY: 2x/week  OT DURATION: 6 weeks  PLANNED INTERVENTIONS: 97168 OT Re-evaluation, 97535 self care/ADL training, 84132 therapeutic exercise, 97530 therapeutic activity, 97140 manual therapy, 97035 ultrasound, 97010 moist heat, 97010 cryotherapy, 97014 electrical stimulation unattended, patient/family education, and DME and/or AE instructions  CONSULTED AND AGREED WITH PLAN OF CARE: Patient  PLAN FOR NEXT SESSION: Follow up on HEP, continue  with scapular theraband, add low level shoulder theraband   Lafonda Piety, OTR/L  [phone] 01/19/2023, 11:00 AM

## 2023-01-19 NOTE — Addendum Note (Signed)
 Addended by: THELBERT VALENTIN BRAVO on: 01/19/2023 10:47 AM   Modules accepted: Orders

## 2023-01-20 ENCOUNTER — Encounter: Payer: Self-pay | Admitting: Physician Assistant

## 2023-01-20 ENCOUNTER — Telehealth: Payer: Self-pay

## 2023-01-20 ENCOUNTER — Other Ambulatory Visit: Payer: Self-pay | Admitting: Physician Assistant

## 2023-01-20 NOTE — Telephone Encounter (Signed)
Auth Submission: APPROVED Site of care: Site of care: [facility] Payer: [employer] Medication & CPT/J Code(s) submitted: Prolia (Denosumab) [id number] Route of submission (phone, fax, portal): portal Phone # Fax # Auth type: Buy/Bill PB Units/visits requested: 60mg , q40months Reference number: [id number] Approval from: 01/20/23 to 01/20/24

## 2023-01-21 ENCOUNTER — Encounter (HOSPITAL_COMMUNITY): Payer: Self-pay | Admitting: Occupational Therapy

## 2023-01-21 ENCOUNTER — Ambulatory Visit (HOSPITAL_COMMUNITY): Payer: 59 | Admitting: Occupational Therapy

## 2023-01-21 DIAGNOSIS — M25511 Pain in right shoulder: Secondary | ICD-10-CM

## 2023-01-21 DIAGNOSIS — M25611 Stiffness of right shoulder, not elsewhere classified: Secondary | ICD-10-CM

## 2023-01-21 DIAGNOSIS — R29898 Other symptoms and signs involving the musculoskeletal system: Secondary | ICD-10-CM

## 2023-01-21 NOTE — Therapy (Signed)
OUTPATIENT OCCUPATIONAL THERAPY ORTHO TREATMENT   Patient Name: Tammy Jensen MRN: [id number] DOB:[birth year], 73 y.o., female Today's Date: 01/21/2023    END OF SESSION:  OT End of Session - 01/21/23 1034     Visit Number 13    Number of Visits 16    Date for OT Re-Evaluation 02/18/23    Authorization Type 1) [employer] Medicare Dual Complete 2) Medicaid    Authorization Time Period no auth required    Progress Note Due on Visit 20    OT Start Time [phone]    OT Stop Time 1034    OT Time Calculation (min) 41 min    Activity Tolerance Patient tolerated treatment well    Behavior During Therapy WFL for tasks assessed/performed                 Past Medical History:  Diagnosis Date   Dysrhythmia    High cholesterol    Hypertension    IBS (irritable bowel syndrome)    Rheumatoid arthritis (HCC)    Tachycardia    Past Surgical History:  Procedure Laterality Date   BIOPSY  04/14/2022   Procedure: BIOPSY;  Surgeon: Dolores Frame, MD;  [facility]: [facility];  Service: Gastroenterology;;   COLONOSCOPY N/A 10/07/2016   Procedure: COLONOSCOPY;  Surgeon: Malissa Hippo, MD;  [facility]: [facility];  Service: Endoscopy;  Laterality: N/A;  730   COLONOSCOPY WITH PROPOFOL N/A 04/14/2022   Procedure: COLONOSCOPY WITH PROPOFOL;  Surgeon: Dolores Frame, MD;  [facility]: [facility];  Service: Gastroenterology;  Laterality: N/A;  915am, asa 2   POLYPECTOMY  04/14/2022   Procedure: POLYPECTOMY;  Surgeon: Dolores Frame, MD;  [facility];  Service: Gastroenterology;;   RECONSTRUCTION OF EYELID  09/02/2021   REVERSE SHOULDER ARTHROPLASTY Right 11/01/2022   Procedure: RIGHT REVERSE SHOULDER ARTHROPLASTY;  Surgeon: Oliver Barre, MD;  Location: [facility];  Service: Orthopedics;  Laterality: Right;  RNFA NEEDED   TUBAL LIGATION     VAGINAL HYSTERECTOMY N/A 10/07/2021   Procedure: HYSTERECTOMY VAGINAL;  Surgeon: Lazaro Arms, MD;  Location: [facility];  Service: Gynecology;  Laterality: N/A;   Patient Active Problem List   Diagnosis Date Noted   Rotator cuff arthropathy of right shoulder 11/01/2022   Constipation 03/11/2022   Liver hemangioma 03/11/2022   Mixed hyperlipidemia 01/29/2021   Rheumatoid arteritis (HCC) 06/26/2020   Pernicious anemia 06/26/2020   Mild valvular heart disease 06/26/2020   Rheumatoid arthritis with rheumatoid factor of multiple sites without organ or systems involvement (HCC) 09/06/2019   High risk medication use 09/06/2019   Elevated LFTs 09/06/2019   Age-related osteoporosis without current pathological fracture 09/06/2019   Dyslipidemia 08/14/2019   Abnormal electrocardiogram 03/12/2019   Chest pain 03/12/2019   HTN (hypertension) 03/12/2019   Obesity 03/12/2019   Family history of colon cancer 05/28/2016   PCP: Dr. Orpah Cobb REFERRING PROVIDER: Dr. Thane Edu  ONSET DATE: 11/01/22  REFERRING DIAG: J19.147 (ICD-10-CM) - Status post reverse arthroplasty of right shoulder   THERAPY DIAG:  Acute pain of right shoulder  Stiffness of right shoulder, not elsewhere classified  Other symptoms and signs involving the musculoskeletal system  Rationale for Evaluation and Treatment: Rehabilitation  SUBJECTIVE:   SUBJECTIVE STATEMENT: S: It's a little sore but not painful.     PERTINENT HISTORY: Pt is a 73 y/o female s/p right reverse TSA on 11/01/2022. Pt presents in sling without abduction pillow. Has been completing HEP given by MD.  PRECAUTIONS: Shoulder See protocol.   WEIGHT BEARING RESTRICTIONS: Yes NWB  PAIN:  Are you having pain? Yes: NPRS scale: 3/10 Pain location: bicep area Pain description: aching, sore Aggravating factors: er stretch at doorway Relieving factors: rest    FALLS: Has patient fallen in last 6 months? No  PLOF: Independent  PATIENT GOALS: To be able to use her arm.   NEXT MD VISIT: 01/26/23  OBJECTIVE:  Note: Objective measures were completed at  Evaluation unless otherwise noted.  HAND DOMINANCE: Left  ADLs: Overall ADLs: Pt is having difficulty with dressing, bathing, reaching up to wash and fix her hair. Pt is unable to reach overhead or behind back. Pt has difficulty with lifting tasks such as meal preparation and housework. Pt is sleeping in a lift chair, has tried the bed a couple of nights but is more sore in the mornings.    FUNCTIONAL OUTCOME MEASURES: FOTO: 51/100 01/07/23: 65.67/100  UPPER EXTREMITY ROM:     Passive ROM Right eval  Shoulder flexion 120  Shoulder abduction 121  Shoulder internal rotation 90  Shoulder external rotation 20  (Blank rows = not tested)  Active ROM Right eval Right 01/07/23  Shoulder flexion 83 125  Shoulder abduction 88 156  Shoulder internal rotation 90 90  Shoulder external rotation 20 55  (Blank rows = not tested)   UPPER EXTREMITY MMT:     MMT Right eval Right 01/07/23  Shoulder flexion 3-/5 4/5  Shoulder abduction 3-/5 4-/5  Shoulder internal rotation 3/5 4-/5  Shoulder external rotation 3-/5 3+/5  (Blank rows = not tested)   EDEMA: occasional ice pack use for swelling  OBSERVATIONS: moderate fascial restrictions in right upper arm, anterior shoulder, trapezius, and scapular regions   TODAY'S TREATMENT:                                                                                                                              DATE: 01/21/23 -myofascial release to right upper arm, anterior shoulder, trapezius, and scapular regions to decrease pain and fascial restrictions and increase joint ROM.  -P/ROM: supine-flexion, abduction, er, horizontal abduction, 5 reps -Strengthening: supine, 1#-protraction, flexion, abduction, er, horizontal abduction, 12 reps -Proximal shoulder strengthening: supine, 1#-paddles, criss cross, circles each direction, 10 reps -Rebounding: 30" shoulder at 90 degrees flexion, mod difficulty with stability -Theraband strengthening:  red-protraction, flexion, abduction, er, IR, horizontal abduction, 10 reps -Ball on wall: 1' flexion, 1' abduction -Overhead lacing: 2# wrist weight, lacing from top down then reversing -UBE: level 2, 3' reverse, pace: 5.5  01/19/23 -myofascial release to right upper arm, anterior shoulder, trapezius, and scapular regions to decrease pain and fascial restrictions and increase joint ROM.  -P/ROM: supine-flexion, abduction, er, horizontal abduction, 5 reps -Strengthening: supine, 1#-protraction, flexion, abduction, er, horizontal abduction, 10 reps -Proximal shoulder strengthening: supine-paddles, criss cross, circles each direction -Shoulder stretches: flexion, IR behind back with horizontal towel, cross chest stretch, doorway stretch, 2x10" holds -Scapular theraband: red-row,  extension, retraction, 10 reps -Overhead lacing: 1# wrist weight, lacing from top down then reversing -UBE: level 2, 2' reverse, pace: 5.5  01/14/23 -myofascial release to right upper arm, anterior shoulder, trapezius, and scapular regions to decrease pain and fascial restrictions and increase joint ROM.  -Shoulder Strengthening: 2#, flexion, abduction, protraction, horizontal abduction, er/IR, x10 -X to V arms, 2#, x10 -Goal Post arms, 2#, x10 -Overhead lacing -functional reaching: 2#, flexion shoulder height, head height, and overhead shelves, x10, 1# in abduction, shoulder height and head height shelf, x10 -UBE: level 3, 2.5' forwards and backwards    PATIENT EDUCATION: Education details: reviewed HEP Person educated: Patient Education method: Explanation, Demonstration, and Handouts Education comprehension: verbalized understanding and returned demonstration  HOME EXERCISE PROGRAM: Eval:  table slides; scapular A/ROM 12/4: AA/ROM 12/20: Scapular strengthening-red band 12/26: A/ROM  1/2: 1-2# weights with shoulder A/ROM 1/3: PNF Strengthening 1/8: Shoulder Stretches  GOALS: Goals reviewed with patient?  Yes  SHORT TERM GOALS: Target date: 12/20/22  Pt will be provided with and educated on HEP to improve mobility in RUE required for use during ADL completion.   Goal status: IN PROGRESS  2.  Pt will increase RUE P/ROM by 10+ degrees to improve ability to use RUE during dressing tasks with minimal compensatory techniques.   Goal status: MET   LONG TERM GOALS: Target date: 01/10/23  Pt will decrease pain in RUE to 3/10 or less to improve ability to sleep for 2+ consecutive hours without waking due to pain.   Goal status: IN PROGRESS  2.  Pt will decrease RUE fascial restrictions to min amounts or less to improve mobility required for functional reaching tasks.   Goal status: IN PROGRESS  3.  Pt will increase RUE A/ROM by 40+ degrees to improve ability to use RUE when reaching overhead or behind back during dressing and bathing tasks.   Goal status: IN PROGRESS  4.  Pt will increase RUE strength to 4+/5 or greater to improve ability to use RUE when lifting or carrying items during meal preparation/housework/yardwork tasks.   Goal status: IN PROGRESS   ASSESSMENT:  CLINICAL IMPRESSION: Pt reports improvement in pain, does continue to have soreness which is expected. Continued with gentle strengthening, added ball on wall in abduction. Also added rebounding exercise for stability, pt reports no increased pain, mod difficulty with stability. Completing theraband strengthening for strength and stability, mod difficulty limiting use of bicep and trapezius. Verbal cuing for form and technique during tasks.    PERFORMANCE DEFICITS: in functional skills including ADLs, IADLs, edema, ROM, strength, pain, fascial restrictions, and UE functional use     PLAN:  OT FREQUENCY: 2x/week  OT DURATION: 6 weeks  PLANNED INTERVENTIONS: 97168 OT Re-evaluation, 97535 self care/ADL training, 04540 therapeutic exercise, 97530 therapeutic activity, 97140 manual therapy, 97035 ultrasound, 97010  moist heat, 97010 cryotherapy, 97014 electrical stimulation unattended, patient/family education, and DME and/or AE instructions  CONSULTED AND AGREED WITH PLAN OF CARE: Patient  PLAN FOR NEXT SESSION: Follow up on HEP, continue with scapular theraband, stability work   [employer], OTR/L  [phone] 01/21/2023, 10:35 AM

## 2023-01-24 ENCOUNTER — Encounter (HOSPITAL_COMMUNITY): Payer: 59 | Admitting: Occupational Therapy

## 2023-01-26 ENCOUNTER — Ambulatory Visit (INDEPENDENT_AMBULATORY_CARE_PROVIDER_SITE_OTHER): Payer: Self-pay

## 2023-01-26 ENCOUNTER — Encounter: Payer: Self-pay | Admitting: Orthopedic Surgery

## 2023-01-26 ENCOUNTER — Ambulatory Visit (INDEPENDENT_AMBULATORY_CARE_PROVIDER_SITE_OTHER): Payer: Medicaid Other | Admitting: Orthopedic Surgery

## 2023-01-26 ENCOUNTER — Encounter (HOSPITAL_COMMUNITY): Payer: Self-pay | Admitting: Occupational Therapy

## 2023-01-26 ENCOUNTER — Ambulatory Visit (HOSPITAL_COMMUNITY): Payer: 59 | Admitting: Occupational Therapy

## 2023-01-26 DIAGNOSIS — Z96611 Presence of right artificial shoulder joint: Secondary | ICD-10-CM

## 2023-01-26 DIAGNOSIS — M25611 Stiffness of right shoulder, not elsewhere classified: Secondary | ICD-10-CM

## 2023-01-26 DIAGNOSIS — M25511 Pain in right shoulder: Secondary | ICD-10-CM | POA: Diagnosis not present

## 2023-01-26 DIAGNOSIS — R29898 Other symptoms and signs involving the musculoskeletal system: Secondary | ICD-10-CM

## 2023-01-26 NOTE — Progress Notes (Signed)
Orthopaedic Postop Note  Assessment: Tammy Jensen is a 73 y.o. female s/p Right Reverse Shoulder Arthroplasty  DOS: 11/01/2022  Plan: Tammy Jensen is doing well.  She continues to improve with therapy.  Therapy is pleased with her progress.  Radiographs remained stable.  Urged her to continue working with therapy, as long as it is helpful.  Medications as needed.  Contact clinic if there are issues.  Follow-up in about 3 months.  Follow-up: Return in about 3 months (around 04/26/2023).  XR at next visit: Right shoulder  Subjective:  Chief Complaint  Patient presents with   Routine Post Op    R RSA DOS 11/01/22    History of Present Illness: Tammy Jensen is a 73 y.o. female who returns to clinic for repeat evaluation of her right shoulder.  Surgery was approximately 3 months ago.  She continues to improve with therapy.  Occasional pains in the front of the shoulder.  She has restrictions with internal rotation, but otherwise is progressing very well.  Review of Systems: No fevers or chills No numbness or tingling No Chest Pain No shortness of breath   Objective: There were no vitals taken for this visit.  Physical Exam:  Alert and oriented, no acute distress  Surgical incision is healing well, no surrounding erythema or drainage Sensation intact in the axillary nerve distribution Active motion intact in the hand 2+ radial pulse Active forward flexion to 140 degrees active abduction to 110 degrees.  Internal rotation to her right buttock.  External rotation at her side to approximately 35 degrees.  Negative belly press.  IMAGING: I personally ordered and reviewed the following images:  X-rays of the right shoulder were obtained in clinic today.  These are compared to prior x-rays.  Reverse shoulder arthroplasty remains in stable position.  No evidence of subsidence.  No fractures.  No lucency around the prostheses.  No bony lesions.  Impression: Stable right  reverse shoulder arthroplasty  Oliver Barre, MD 01/26/2023 8:50 AM

## 2023-01-26 NOTE — Therapy (Signed)
OUTPATIENT OCCUPATIONAL THERAPY ORTHO TREATMENT   Patient Name: Tammy Jensen MRN: [id number] DOB:Jan 21, 1950, 73 y.o., female Today's Date: 01/26/2023    END OF SESSION:  OT End of Session - 01/26/23 1053     Visit Number 14    Number of Visits 18    Date for OT Re-Evaluation 02/18/23    Authorization Type 1) [employer] Medicare Dual Complete 2) Medicaid    Authorization Time Period no auth required    Progress Note Due on Visit 20    OT Start Time 1016    OT Stop Time 1058    OT Time Calculation (min) 42 min    Activity Tolerance Patient tolerated treatment well    Behavior During Therapy WFL for tasks assessed/performed                  Past Medical History:  Diagnosis Date   Dysrhythmia    High cholesterol    Hypertension    IBS (irritable bowel syndrome)    Rheumatoid arthritis (HCC)    Tachycardia    Past Surgical History:  Procedure Laterality Date   BIOPSY  04/14/2022   Procedure: BIOPSY;  Surgeon: Dolores Frame, MD;  Location: [facility];  Service: Gastroenterology;;   COLONOSCOPY N/A 10/07/2016   Procedure: COLONOSCOPY;  Surgeon: Malissa Hippo, MD;  Location: [facility];  Service: Endoscopy;  Laterality: N/A;  730   COLONOSCOPY WITH PROPOFOL N/A 04/14/2022   Procedure: COLONOSCOPY WITH PROPOFOL;  Surgeon: Dolores Frame, MD;  Location: [facility];  Service: Gastroenterology;  Laterality: N/A;  915am, asa 2   POLYPECTOMY  04/14/2022   Procedure: POLYPECTOMY;  Surgeon: Dolores Frame, MD;  Location: [facility];  Service: Gastroenterology;;   RECONSTRUCTION OF EYELID  09/02/2021   REVERSE SHOULDER ARTHROPLASTY Right 11/01/2022   Procedure: RIGHT REVERSE SHOULDER ARTHROPLASTY;  Surgeon: Oliver Barre, MD;  Location: [facility];  Service: Orthopedics;  Laterality: Right;  RNFA NEEDED   TUBAL LIGATION     VAGINAL HYSTERECTOMY N/A 10/07/2021   Procedure: HYSTERECTOMY VAGINAL;  Surgeon: Lazaro Arms, MD;  Location: [facility];  Service: Gynecology;  Laterality: N/A;   Patient Active Problem List   Diagnosis Date Noted   Rotator cuff arthropathy of right shoulder 11/01/2022   Constipation 03/11/2022   Liver hemangioma 03/11/2022   Mixed hyperlipidemia 01/29/2021   Rheumatoid arteritis (HCC) 06/26/2020   Pernicious anemia 06/26/2020   Mild valvular heart disease 06/26/2020   Rheumatoid arthritis with rheumatoid factor of multiple sites without organ or systems involvement (HCC) 09/06/2019   High risk medication use 09/06/2019   Elevated LFTs 09/06/2019   Age-related osteoporosis without current pathological fracture 09/06/2019   Dyslipidemia 08/14/2019   Abnormal electrocardiogram 03/12/2019   Chest pain 03/12/2019   HTN (hypertension) 03/12/2019   Obesity 03/12/2019   Family history of colon cancer 05/28/2016   PCP: Dr. Orpah Cobb REFERRING PROVIDER: Dr. Thane Edu  ONSET DATE: 11/01/22  REFERRING DIAG: U44.034 (ICD-10-CM) - Status post reverse arthroplasty of right shoulder   THERAPY DIAG:  Acute pain of right shoulder  Stiffness of right shoulder, not elsewhere classified  Other symptoms and signs involving the musculoskeletal system  Rationale for Evaluation and Treatment: Rehabilitation  SUBJECTIVE:   SUBJECTIVE STATEMENT: S: He said it's looking good.    PERTINENT HISTORY: Pt is a 73 y/o female s/p right reverse TSA on 11/01/2022. Pt presents in sling without abduction pillow. Has been completing HEP given by MD.  PRECAUTIONS: Shoulder See protocol.   WEIGHT BEARING RESTRICTIONS: Yes NWB  PAIN:  Are you having pain? Yes: NPRS scale: 3/10 Pain location: bicep area Pain description: aching, sore Aggravating factors: er stretch at doorway Relieving factors: rest    FALLS: Has patient fallen in last 6 months? No  PLOF: Independent  PATIENT GOALS: To be able to use her arm.   NEXT MD VISIT: 04/29/23  OBJECTIVE:  Note: Objective measures were completed at  Evaluation unless otherwise noted.  HAND DOMINANCE: Left  ADLs: Overall ADLs: Pt is having difficulty with dressing, bathing, reaching up to wash and fix her hair. Pt is unable to reach overhead or behind back. Pt has difficulty with lifting tasks such as meal preparation and housework. Pt is sleeping in a lift chair, has tried the bed a couple of nights but is more sore in the mornings.    FUNCTIONAL OUTCOME MEASURES: FOTO: 51/100 01/07/23: 65.67/100  UPPER EXTREMITY ROM:     Passive ROM Right eval  Shoulder flexion 120  Shoulder abduction 121  Shoulder internal rotation 90  Shoulder external rotation 20  (Blank rows = not tested)  Active ROM Right eval Right 01/07/23  Shoulder flexion 83 125  Shoulder abduction 88 156  Shoulder internal rotation 90 90  Shoulder external rotation 20 55  (Blank rows = not tested)   UPPER EXTREMITY MMT:     MMT Right eval Right 01/07/23  Shoulder flexion 3-/5 4/5  Shoulder abduction 3-/5 4-/5  Shoulder internal rotation 3/5 4-/5  Shoulder external rotation 3-/5 3+/5  (Blank rows = not tested)   EDEMA: occasional ice pack use for swelling  OBSERVATIONS: moderate fascial restrictions in right upper arm, anterior shoulder, trapezius, and scapular regions   TODAY'S TREATMENT:                                                                                                                              DATE: 01/26/23 -myofascial release to right upper arm, anterior shoulder, trapezius, and scapular regions to decrease pain and fascial restrictions and increase joint ROM.  -P/ROM: supine-flexion, abduction, er, horizontal abduction, 5 reps -Strengthening: supine, 2#-protraction, flexion, abduction, er, horizontal abduction, 10 reps -Proximal shoulder strengthening: supine-2#-paddles, criss cross, circles each direction, 10 reps -Theraband strengthening: red-protraction, flexion, abduction, horizontal abduction, er, IR, 10 reps -Scapular  theraband: red-row, extension, retraction, 10 reps -Therapy ball strengthening: sitting-basketball, circles each direction, flexion from 90 degrees flexion to overhead position, 10 reps -Functional reaching: pt sitting, placing pins on pinch tree in flexion, removing in abduction -UBE: level 2, 3' forward 3' reverse, pace: 9.0  01/21/23 -myofascial release to right upper arm, anterior shoulder, trapezius, and scapular regions to decrease pain and fascial restrictions and increase joint ROM.  -P/ROM: supine-flexion, abduction, er, horizontal abduction, 5 reps -Strengthening: supine, 1#-protraction, flexion, abduction, er, horizontal abduction, 12 reps -Proximal shoulder strengthening: supine, 1#-paddles, criss cross, circles each direction, 10 reps -Rebounding: 30" shoulder at 90 degrees  flexion, mod difficulty with stability -Theraband strengthening: red-protraction, flexion, abduction, er, IR, horizontal abduction, 10 reps -Ball on wall: 1' flexion, 1' abduction -Overhead lacing: 2# wrist weight, lacing from top down then reversing -UBE: level 2, 3' reverse, pace: 5.5  01/19/23 -myofascial release to right upper arm, anterior shoulder, trapezius, and scapular regions to decrease pain and fascial restrictions and increase joint ROM.  -P/ROM: supine-flexion, abduction, er, horizontal abduction, 5 reps -Strengthening: supine, 1#-protraction, flexion, abduction, er, horizontal abduction, 10 reps -Proximal shoulder strengthening: supine-paddles, criss cross, circles each direction -Shoulder stretches: flexion, IR behind back with horizontal towel, cross chest stretch, doorway stretch, 2x10" holds -Scapular theraband: red-row, extension, retraction, 10 reps -Overhead lacing: 1# wrist weight, lacing from top down then reversing -UBE: level 2, 2' reverse, pace: 5.5    PATIENT EDUCATION: Education details: reviewed HEP Person educated: Patient Education method: Explanation, Demonstration, and  Handouts Education comprehension: verbalized understanding and returned demonstration  HOME EXERCISE PROGRAM: Eval:  table slides; scapular A/ROM 12/4: AA/ROM 12/20: Scapular strengthening-red band 12/26: A/ROM  1/2: 1-2# weights with shoulder A/ROM 1/3: PNF Strengthening 1/8: Shoulder Stretches  GOALS: Goals reviewed with patient? Yes  SHORT TERM GOALS: Target date: 12/20/22  Pt will be provided with and educated on HEP to improve mobility in RUE required for use during ADL completion.   Goal status: IN PROGRESS  2.  Pt will increase RUE P/ROM by 10+ degrees to improve ability to use RUE during dressing tasks with minimal compensatory techniques.   Goal status: MET   LONG TERM GOALS: Target date: 01/10/23  Pt will decrease pain in RUE to 3/10 or less to improve ability to sleep for 2+ consecutive hours without waking due to pain.   Goal status: IN PROGRESS  2.  Pt will decrease RUE fascial restrictions to min amounts or less to improve mobility required for functional reaching tasks.   Goal status: IN PROGRESS  3.  Pt will increase RUE A/ROM by 40+ degrees to improve ability to use RUE when reaching overhead or behind back during dressing and bathing tasks.   Goal status: IN PROGRESS  4.  Pt will increase RUE strength to 4+/5 or greater to improve ability to use RUE when lifting or carrying items during meal preparation/housework/yardwork tasks.   Goal status: IN PROGRESS   ASSESSMENT:  CLINICAL IMPRESSION: Pt reports MD is pleased with her progress, imaging looks good. Continued with strengthening, added theraband strengthening and began therapy ball strengthening today. Improvement in scapular strengthening form today, continued with functional reaching using pinch tree. Pt with ROM 75%+ during tasks today. Verbal cuing for form and technique during tasks-pt tends to use bicep versus RC musculature for flexion at times.     PERFORMANCE DEFICITS: in functional  skills including ADLs, IADLs, edema, ROM, strength, pain, fascial restrictions, and UE functional use     PLAN:  OT FREQUENCY: 2x/week  OT DURATION: 6 weeks  PLANNED INTERVENTIONS: 97168 OT Re-evaluation, 97535 self care/ADL training, 40981 therapeutic exercise, 97530 therapeutic activity, 97140 manual therapy, 97035 ultrasound, 97010 moist heat, 97010 cryotherapy, 97014 electrical stimulation unattended, patient/family education, and DME and/or AE instructions  CONSULTED AND AGREED WITH PLAN OF CARE: Patient  PLAN FOR NEXT SESSION: Follow up on HEP, continue with scapular theraband, stability work; complete strengthening in sidelying   [employer], OTR/L  [phone] 01/26/2023, 11:00 AM

## 2023-01-28 ENCOUNTER — Encounter (HOSPITAL_COMMUNITY): Payer: Self-pay | Admitting: Occupational Therapy

## 2023-01-28 ENCOUNTER — Ambulatory Visit (HOSPITAL_COMMUNITY): Payer: 59 | Admitting: Occupational Therapy

## 2023-01-28 DIAGNOSIS — M25511 Pain in right shoulder: Secondary | ICD-10-CM | POA: Diagnosis not present

## 2023-01-28 DIAGNOSIS — M25611 Stiffness of right shoulder, not elsewhere classified: Secondary | ICD-10-CM

## 2023-01-28 DIAGNOSIS — R29898 Other symptoms and signs involving the musculoskeletal system: Secondary | ICD-10-CM

## 2023-01-28 NOTE — Therapy (Signed)
OUTPATIENT OCCUPATIONAL THERAPY ORTHO TREATMENT   Patient Name: Tammy Jensen MRN: [id number] DOB:1950/07/02, 73 y.o., female Today's Date: 01/28/2023    END OF SESSION:  OT End of Session - 01/28/23 1000     Visit Number 15    Number of Visits 18    Date for OT Re-Evaluation 02/18/23    Authorization Type 1) [employer] Medicare Dual Complete 2) Medicaid    Authorization Time Period no auth required    Progress Note Due on Visit 20    OT Start Time 0919    OT Stop Time 1000    OT Time Calculation (min) 41 min    Activity Tolerance Patient tolerated treatment well    Behavior During Therapy WFL for tasks assessed/performed                   Past Medical History:  Diagnosis Date   Dysrhythmia    High cholesterol    Hypertension    IBS (irritable bowel syndrome)    Rheumatoid arthritis (HCC)    Tachycardia    Past Surgical History:  Procedure Laterality Date   BIOPSY  04/14/2022   Procedure: BIOPSY;  Surgeon: Dolores Frame, MD;  [facility]: [facility];  Service: Gastroenterology;;   COLONOSCOPY N/A 10/07/2016   Procedure: COLONOSCOPY;  Surgeon: Malissa Hippo, MD;  [facility]: [facility];  Service: Endoscopy;  Laterality: N/A;  730   COLONOSCOPY WITH PROPOFOL N/A 04/14/2022   Procedure: COLONOSCOPY WITH PROPOFOL;  Surgeon: Dolores Frame, MD;  [facility]: [facility];  Service: Gastroenterology;  Laterality: N/A;  915am, asa 2   POLYPECTOMY  04/14/2022   Procedure: POLYPECTOMY;  Surgeon: Dolores Frame, MD;  [facility];  Service: Gastroenterology;;   RECONSTRUCTION OF EYELID  09/02/2021   REVERSE SHOULDER ARTHROPLASTY Right 11/01/2022   Procedure: RIGHT REVERSE SHOULDER ARTHROPLASTY;  Surgeon: Oliver Barre, MD;  Location: [facility];  Service: Orthopedics;  Laterality: Right;  RNFA NEEDED   TUBAL LIGATION     VAGINAL HYSTERECTOMY N/A 10/07/2021   Procedure: HYSTERECTOMY VAGINAL;  Surgeon: Lazaro Arms, MD;  Location:  [facility];  Service: Gynecology;  Laterality: N/A;   Patient Active Problem List   Diagnosis Date Noted   Rotator cuff arthropathy of right shoulder 11/01/2022   Constipation 03/11/2022   Liver hemangioma 03/11/2022   Mixed hyperlipidemia 01/29/2021   Rheumatoid arteritis (HCC) 06/26/2020   Pernicious anemia 06/26/2020   Mild valvular heart disease 06/26/2020   Rheumatoid arthritis with rheumatoid factor of multiple sites without organ or systems involvement (HCC) 09/06/2019   High risk medication use 09/06/2019   Elevated LFTs 09/06/2019   Age-related osteoporosis without current pathological fracture 09/06/2019   Dyslipidemia 08/14/2019   Abnormal electrocardiogram 03/12/2019   Chest pain 03/12/2019   HTN (hypertension) 03/12/2019   Obesity 03/12/2019   Family history of colon cancer 05/28/2016   PCP: Dr. Orpah Cobb REFERRING PROVIDER: Dr. Thane Edu  ONSET DATE: 11/01/22  REFERRING DIAG: G40.102 (ICD-10-CM) - Status post reverse arthroplasty of right shoulder   THERAPY DIAG:  Acute pain of right shoulder  Stiffness of right shoulder, not elsewhere classified  Other symptoms and signs involving the musculoskeletal system  Rationale for Evaluation and Treatment: Rehabilitation  SUBJECTIVE:   SUBJECTIVE STATEMENT: S: It's a little sore but it's more my back than my shoulder.    PERTINENT HISTORY: Pt is a 73 y/o female s/p right reverse TSA on 11/01/2022. Pt presents in sling without abduction pillow. Has  been completing HEP given by MD.   PRECAUTIONS: Shoulder See protocol.   WEIGHT BEARING RESTRICTIONS: Yes NWB  PAIN:  Are you having pain? Yes: NPRS scale: 3/10 Pain location: bicep area Pain description: aching, sore Aggravating factors: er stretch at doorway Relieving factors: rest    FALLS: Has patient fallen in last 6 months? No  PLOF: Independent  PATIENT GOALS: To be able to use her arm.   NEXT MD VISIT: 04/29/23  OBJECTIVE:  Note: Objective  measures were completed at Evaluation unless otherwise noted.  HAND DOMINANCE: Left  ADLs: Overall ADLs: Pt is having difficulty with dressing, bathing, reaching up to wash and fix her hair. Pt is unable to reach overhead or behind back. Pt has difficulty with lifting tasks such as meal preparation and housework. Pt is sleeping in a lift chair, has tried the bed a couple of nights but is more sore in the mornings.    FUNCTIONAL OUTCOME MEASURES: FOTO: 51/100 01/07/23: 65.67/100  UPPER EXTREMITY ROM:     Passive ROM Right eval  Shoulder flexion 120  Shoulder abduction 121  Shoulder internal rotation 90  Shoulder external rotation 20  (Blank rows = not tested)  Active ROM Right eval Right 01/07/23  Shoulder flexion 83 125  Shoulder abduction 88 156  Shoulder internal rotation 90 90  Shoulder external rotation 20 55  (Blank rows = not tested)   UPPER EXTREMITY MMT:     MMT Right eval Right 01/07/23  Shoulder flexion 3-/5 4/5  Shoulder abduction 3-/5 4-/5  Shoulder internal rotation 3/5 4-/5  Shoulder external rotation 3-/5 3+/5  (Blank rows = not tested)   EDEMA: occasional ice pack use for swelling  OBSERVATIONS: moderate fascial restrictions in right upper arm, anterior shoulder, trapezius, and scapular regions   TODAY'S TREATMENT:                                                                                                                              DATE: 01/28/23 -myofascial release to right upper arm, anterior shoulder, trapezius, and scapular regions to decrease pain and fascial restrictions and increase joint ROM.  -P/ROM: supine-flexion, abduction, er, horizontal abduction, 5 reps -Sidelying A/ROM: protraction, flexion, abduction, er, horizontal abduction, 10 reps -Strengthening: standing, 1#-protraction, flexion, abduction, er, horizontal abduction, 10 reps -X to V arms, 1#: 10 reps -Ball on wall: 1' flexion, 1' abduction -ABC Writing: shoulder at 90  degrees flexion, 1# weight -Therapy ball strengthening: sitting-basketball, circles each direction, flexion from 90 degrees flexion to overhead position, PNF patterns, 10 reps -Pen pass behind back for IR, behind head for er, 10 reps  01/26/23 -myofascial release to right upper arm, anterior shoulder, trapezius, and scapular regions to decrease pain and fascial restrictions and increase joint ROM.  -P/ROM: supine-flexion, abduction, er, horizontal abduction, 5 reps -Strengthening: supine, 2#-protraction, flexion, abduction, er, horizontal abduction, 10 reps -Proximal shoulder strengthening: supine-2#-paddles, criss cross, circles each direction, 10 reps -Theraband strengthening: red-protraction, flexion,  abduction, horizontal abduction, er, IR, 10 reps -Scapular theraband: red-row, extension, retraction, 10 reps -Therapy ball strengthening: sitting-basketball, circles each direction, flexion from 90 degrees flexion to overhead position, 10 reps -Functional reaching: pt sitting, placing pins on pinch tree in flexion, removing in abduction -UBE: level 2, 3' forward 3' reverse, pace: 9.0  01/21/23 -myofascial release to right upper arm, anterior shoulder, trapezius, and scapular regions to decrease pain and fascial restrictions and increase joint ROM.  -P/ROM: supine-flexion, abduction, er, horizontal abduction, 5 reps -Strengthening: supine, 1#-protraction, flexion, abduction, er, horizontal abduction, 12 reps -Proximal shoulder strengthening: supine, 1#-paddles, criss cross, circles each direction, 10 reps -Rebounding: 30" shoulder at 90 degrees flexion, mod difficulty with stability -Theraband strengthening: red-protraction, flexion, abduction, er, IR, horizontal abduction, 10 reps -Ball on wall: 1' flexion, 1' abduction -Overhead lacing: 2# wrist weight, lacing from top down then reversing -UBE: level 2, 3' reverse, pace: 5.5     PATIENT EDUCATION: Education details: reviewed HEP Person  educated: Patient Education method: Explanation, Demonstration, and Handouts Education comprehension: verbalized understanding and returned demonstration  HOME EXERCISE PROGRAM: Eval:  table slides; scapular A/ROM 12/4: AA/ROM 12/20: Scapular strengthening-red band 12/26: A/ROM  1/2: 1-2# weights with shoulder A/ROM 1/3: PNF Strengthening 1/8: Shoulder Stretches  GOALS: Goals reviewed with patient? Yes  SHORT TERM GOALS: Target date: 12/20/22  Pt will be provided with and educated on HEP to improve mobility in RUE required for use during ADL completion.   Goal status: IN PROGRESS  2.  Pt will increase RUE P/ROM by 10+ degrees to improve ability to use RUE during dressing tasks with minimal compensatory techniques.   Goal status: MET   LONG TERM GOALS: Target date: 01/10/23  Pt will decrease pain in RUE to 3/10 or less to improve ability to sleep for 2+ consecutive hours without waking due to pain.   Goal status: IN PROGRESS  2.  Pt will decrease RUE fascial restrictions to min amounts or less to improve mobility required for functional reaching tasks.   Goal status: IN PROGRESS  3.  Pt will increase RUE A/ROM by 40+ degrees to improve ability to use RUE when reaching overhead or behind back during dressing and bathing tasks.   Goal status: IN PROGRESS  4.  Pt will increase RUE strength to 4+/5 or greater to improve ability to use RUE when lifting or carrying items during meal preparation/housework/yardwork tasks.   Goal status: IN PROGRESS   ASSESSMENT:  CLINICAL IMPRESSION: Pt reports soreness, like working out. Continued with manual techniques, fascial restrictions at anterior shoulder are improving. Completed sidelying A/ROM today versus supine. Strengthening in standing, added x to v arms and PNF patterns with basketball. Pt with occasional fatigue, verbal cuing for form and technique with pushing elbow into extension.    PERFORMANCE DEFICITS: in functional  skills including ADLs, IADLs, edema, ROM, strength, pain, fascial restrictions, and UE functional use     PLAN:  OT FREQUENCY: 2x/week  OT DURATION: 6 weeks  PLANNED INTERVENTIONS: 97168 OT Re-evaluation, 97535 self care/ADL training, 40102 therapeutic exercise, 97530 therapeutic activity, 97140 manual therapy, 97035 ultrasound, 97010 moist heat, 97010 cryotherapy, 97014 electrical stimulation unattended, patient/family education, and DME and/or AE instructions  CONSULTED AND AGREED WITH PLAN OF CARE: Patient  PLAN FOR NEXT SESSION: Follow up on HEP, continue with scapular theraband, stability work; complete strengthening in sidelying   [employer], OTR/L  [phone] 01/28/2023, 10:00 AM

## 2023-01-31 ENCOUNTER — Encounter (HOSPITAL_COMMUNITY): Payer: 59 | Admitting: Occupational Therapy

## 2023-02-02 ENCOUNTER — Ambulatory Visit (HOSPITAL_COMMUNITY): Payer: 59 | Admitting: Occupational Therapy

## 2023-02-02 ENCOUNTER — Encounter (HOSPITAL_COMMUNITY): Payer: Self-pay | Admitting: Occupational Therapy

## 2023-02-02 ENCOUNTER — Other Ambulatory Visit (HOSPITAL_COMMUNITY): Payer: Self-pay | Admitting: Family Medicine

## 2023-02-02 DIAGNOSIS — M25611 Stiffness of right shoulder, not elsewhere classified: Secondary | ICD-10-CM

## 2023-02-02 DIAGNOSIS — M25511 Pain in right shoulder: Secondary | ICD-10-CM | POA: Diagnosis not present

## 2023-02-02 DIAGNOSIS — R29898 Other symptoms and signs involving the musculoskeletal system: Secondary | ICD-10-CM

## 2023-02-02 DIAGNOSIS — Z1231 Encounter for screening mammogram for malignant neoplasm of breast: Secondary | ICD-10-CM

## 2023-02-02 NOTE — Therapy (Signed)
OUTPATIENT OCCUPATIONAL THERAPY ORTHO TREATMENT   Patient Name: Tammy Jensen MRN: [id number] DOB:January 21, 1950, 73 y.o., female Today's Date: 02/02/2023    END OF SESSION:  OT End of Session - 02/02/23 1227     Visit Number 16    Number of Visits 18    Date for OT Re-Evaluation 02/18/23    Authorization Type 1) [employer] Medicare Dual Complete 2) Medicaid    Authorization Time Period no auth required    Progress Note Due on Visit 20    OT Start Time 1144    OT Stop Time 1228    OT Time Calculation (min) 44 min    Activity Tolerance Patient tolerated treatment well    Behavior During Therapy WFL for tasks assessed/performed                    Past Medical History:  Diagnosis Date   Dysrhythmia    High cholesterol    Hypertension    IBS (irritable bowel syndrome)    Rheumatoid arthritis (HCC)    Tachycardia    Past Surgical History:  Procedure Laterality Date   BIOPSY  04/14/2022   Procedure: BIOPSY;  Surgeon: Dolores Frame, MD;  Location: [facility];  Service: Gastroenterology;;   COLONOSCOPY N/A 10/07/2016   Procedure: COLONOSCOPY;  Surgeon: Malissa Hippo, MD;  Location: [facility];  Service: Endoscopy;  Laterality: N/A;  730   COLONOSCOPY WITH PROPOFOL N/A 04/14/2022   Procedure: COLONOSCOPY WITH PROPOFOL;  Surgeon: Dolores Frame, MD;  Location: [facility];  Service: Gastroenterology;  Laterality: N/A;  915am, asa 2   POLYPECTOMY  04/14/2022   Procedure: POLYPECTOMY;  Surgeon: Dolores Frame, MD;  Location: [facility];  Service: Gastroenterology;;   RECONSTRUCTION OF EYELID  09/02/2021   REVERSE SHOULDER ARTHROPLASTY Right 11/01/2022   Procedure: RIGHT REVERSE SHOULDER ARTHROPLASTY;  Surgeon: Oliver Barre, MD;  Location: [facility];  Service: Orthopedics;  Laterality: Right;  RNFA NEEDED   TUBAL LIGATION     VAGINAL HYSTERECTOMY N/A 10/07/2021   Procedure: HYSTERECTOMY VAGINAL;  Surgeon: Lazaro Arms, MD;   Location: [facility];  Service: Gynecology;  Laterality: N/A;   Patient Active Problem List   Diagnosis Date Noted   Rotator cuff arthropathy of right shoulder 11/01/2022   Constipation 03/11/2022   Liver hemangioma 03/11/2022   Mixed hyperlipidemia 01/29/2021   Rheumatoid arteritis (HCC) 06/26/2020   Pernicious anemia 06/26/2020   Mild valvular heart disease 06/26/2020   Rheumatoid arthritis with rheumatoid factor of multiple sites without organ or systems involvement (HCC) 09/06/2019   High risk medication use 09/06/2019   Elevated LFTs 09/06/2019   Age-related osteoporosis without current pathological fracture 09/06/2019   Dyslipidemia 08/14/2019   Abnormal electrocardiogram 03/12/2019   Chest pain 03/12/2019   HTN (hypertension) 03/12/2019   Obesity 03/12/2019   Family history of colon cancer 05/28/2016   PCP: Dr. Orpah Cobb REFERRING PROVIDER: Dr. Thane Edu  ONSET DATE: 11/01/22  REFERRING DIAG: K44.010 (ICD-10-CM) - Status post reverse arthroplasty of right shoulder   THERAPY DIAG:  Acute pain of right shoulder  Stiffness of right shoulder, not elsewhere classified  Other symptoms and signs involving the musculoskeletal system  Rationale for Evaluation and Treatment: Rehabilitation  SUBJECTIVE:   SUBJECTIVE STATEMENT: S: It's ok, just a little pain here and there.   PERTINENT HISTORY: Pt is a 73 y/o female s/p right reverse TSA on 11/01/2022. Pt presents in sling without abduction pillow. Has been completing HEP  given by MD.   PRECAUTIONS: Shoulder See protocol.   WEIGHT BEARING RESTRICTIONS: Yes NWB  PAIN:  Are you having pain? Yes: NPRS scale: 1/10 Pain location: bicep area Pain description: aching, sore Aggravating factors: er stretch at doorway Relieving factors: rest    FALLS: Has patient fallen in last 6 months? No  PLOF: Independent  PATIENT GOALS: To be able to use her arm.   NEXT MD VISIT: 04/29/23  OBJECTIVE:  Note: Objective  measures were completed at Evaluation unless otherwise noted.  HAND DOMINANCE: Left  ADLs: Overall ADLs: Pt is having difficulty with dressing, bathing, reaching up to wash and fix her hair. Pt is unable to reach overhead or behind back. Pt has difficulty with lifting tasks such as meal preparation and housework. Pt is sleeping in a lift chair, has tried the bed a couple of nights but is more sore in the mornings.    FUNCTIONAL OUTCOME MEASURES: FOTO: 51/100 01/07/23: 65.67/100  UPPER EXTREMITY ROM:     Passive ROM Right eval  Shoulder flexion 120  Shoulder abduction 121  Shoulder internal rotation 90  Shoulder external rotation 20  (Blank rows = not tested)  Active ROM Right eval Right 01/07/23  Shoulder flexion 83 125  Shoulder abduction 88 156  Shoulder internal rotation 90 90  Shoulder external rotation 20 55  (Blank rows = not tested)   UPPER EXTREMITY MMT:     MMT Right eval Right 01/07/23  Shoulder flexion 3-/5 4/5  Shoulder abduction 3-/5 4-/5  Shoulder internal rotation 3/5 4-/5  Shoulder external rotation 3-/5 3+/5  (Blank rows = not tested)   EDEMA: occasional ice pack use for swelling  OBSERVATIONS: moderate fascial restrictions in right upper arm, anterior shoulder, trapezius, and scapular regions   TODAY'S TREATMENT:                                                                                                                              DATE: 02/02/23 -myofascial release to right upper arm, anterior shoulder, trapezius, and scapular regions to decrease pain and fascial restrictions and increase joint ROM.  -P/ROM: supine-flexion, abduction, er, horizontal abduction, 5 reps -Sidelying A/ROM: protraction, flexion, abduction, er, horizontal abduction, 10 reps -X to V arms, 1#: 10 reps -ABC Writing: shoulder at 90 degrees flexion, 1# weight -Debroah Loop press: A/ROM, 10 reps -Strengthening: sitting-1#-protraction, flexion, abduction, er, horizontal  abduction, 10 reps -Proximal shoulder strengthening: supine-1#-paddles, criss cross, circles each direction, 12 reps -Therapy ball strengthening: green ball, standing-chest press, flexion, circles each direction, 10 reps -Ball on wall: 1' flexion, 1' abduction -Ball pass behind back for IR, behind head for er, 10 reps  01/28/23 -myofascial release to right upper arm, anterior shoulder, trapezius, and scapular regions to decrease pain and fascial restrictions and increase joint ROM.  -P/ROM: supine-flexion, abduction, er, horizontal abduction, 5 reps -Sidelying A/ROM: protraction, flexion, abduction, er, horizontal abduction, 10 reps -Strengthening: standing, 1#-protraction, flexion, abduction, er, horizontal abduction, 10  reps -X to V arms, 1#: 10 reps -Ball on wall: 1' flexion, 1' abduction -ABC Writing: shoulder at 90 degrees flexion, 1# weight -Therapy ball strengthening: sitting-basketball, circles each direction, flexion from 90 degrees flexion to overhead position, PNF patterns, 10 reps -Pen pass behind back for IR, behind head for er, 10 reps  01/26/23 -myofascial release to right upper arm, anterior shoulder, trapezius, and scapular regions to decrease pain and fascial restrictions and increase joint ROM.  -P/ROM: supine-flexion, abduction, er, horizontal abduction, 5 reps -Strengthening: supine, 2#-protraction, flexion, abduction, er, horizontal abduction, 10 reps -Proximal shoulder strengthening: supine-2#-paddles, criss cross, circles each direction, 10 reps -Theraband strengthening: red-protraction, flexion, abduction, horizontal abduction, er, IR, 10 reps -Scapular theraband: red-row, extension, retraction, 10 reps -Therapy ball strengthening: sitting-basketball, circles each direction, flexion from 90 degrees flexion to overhead position, 10 reps -Functional reaching: pt sitting, placing pins on pinch tree in flexion, removing in abduction -UBE: level 2, 3' forward 3' reverse,  pace: 9.0     PATIENT EDUCATION: Education details: reviewed HEP Person educated: Patient Education method: Explanation, Demonstration, and Handouts Education comprehension: verbalized understanding and returned demonstration  HOME EXERCISE PROGRAM: Eval:  table slides; scapular A/ROM 12/4: AA/ROM 12/20: Scapular strengthening-red band 12/26: A/ROM  1/2: 1-2# weights with shoulder A/ROM 1/3: PNF Strengthening 1/8: Shoulder Stretches  GOALS: Goals reviewed with patient? Yes  SHORT TERM GOALS: Target date: 12/20/22  Pt will be provided with and educated on HEP to improve mobility in RUE required for use during ADL completion.   Goal status: IN PROGRESS  2.  Pt will increase RUE P/ROM by 10+ degrees to improve ability to use RUE during dressing tasks with minimal compensatory techniques.   Goal status: MET   LONG TERM GOALS: Target date: 01/10/23  Pt will decrease pain in RUE to 3/10 or less to improve ability to sleep for 2+ consecutive hours without waking due to pain.   Goal status: IN PROGRESS  2.  Pt will decrease RUE fascial restrictions to min amounts or less to improve mobility required for functional reaching tasks.   Goal status: IN PROGRESS  3.  Pt will increase RUE A/ROM by 40+ degrees to improve ability to use RUE when reaching overhead or behind back during dressing and bathing tasks.   Goal status: IN PROGRESS  4.  Pt will increase RUE strength to 4+/5 or greater to improve ability to use RUE when lifting or carrying items during meal preparation/housework/yardwork tasks.   Goal status: IN PROGRESS   ASSESSMENT:  CLINICAL IMPRESSION: Pt reports shoulder is feeling good, HEP is going well. Continued with sidelying A/ROM, sitting strengthening. Upgraded therapy ball work to green ball and added flexion. Pt with improved form during ball on wall and increased ROM with er behind back. Pt with occasional fatigue, verbal cuing for form and technique with  pushing elbow into extension.    PERFORMANCE DEFICITS: in functional skills including ADLs, IADLs, edema, ROM, strength, pain, fascial restrictions, and UE functional use     PLAN:  OT FREQUENCY: 2x/week  OT DURATION: 6 weeks  PLANNED INTERVENTIONS: 97168 OT Re-evaluation, 97535 self care/ADL training, 11914 therapeutic exercise, 97530 therapeutic activity, 97140 manual therapy, 97035 ultrasound, 97010 moist heat, 97010 cryotherapy, 97014 electrical stimulation unattended, patient/family education, and DME and/or AE instructions  CONSULTED AND AGREED WITH PLAN OF CARE: Patient  PLAN FOR NEXT SESSION: Follow up on HEP, continue with scapular theraband, stability work; complete strengthening in sidelying   [employer], OTR/L  [phone] 02/02/2023,  12:28 PM

## 2023-02-04 ENCOUNTER — Encounter (HOSPITAL_COMMUNITY): Payer: Self-pay | Admitting: Occupational Therapy

## 2023-02-04 ENCOUNTER — Ambulatory Visit (HOSPITAL_COMMUNITY): Payer: 59 | Admitting: Occupational Therapy

## 2023-02-04 DIAGNOSIS — M25611 Stiffness of right shoulder, not elsewhere classified: Secondary | ICD-10-CM

## 2023-02-04 DIAGNOSIS — M25511 Pain in right shoulder: Secondary | ICD-10-CM

## 2023-02-04 DIAGNOSIS — R29898 Other symptoms and signs involving the musculoskeletal system: Secondary | ICD-10-CM

## 2023-02-04 NOTE — Therapy (Signed)
OUTPATIENT OCCUPATIONAL THERAPY ORTHO TREATMENT   Patient Name: Tammy Jensen MRN: [id number] DOB:02-18-1950, 73 y.o., female Today's Date: 02/04/2023    END OF SESSION:  OT End of Session - 02/04/23 1101     Visit Number 17    Number of Visits 18    Date for OT Re-Evaluation 02/18/23    Authorization Type 1) [employer] Medicare Dual Complete 2) Medicaid    Authorization Time Period no auth required    Progress Note Due on Visit 20    OT Start Time 1015    OT Stop Time 1100    OT Time Calculation (min) 45 min    Activity Tolerance Patient tolerated treatment well    Behavior During Therapy WFL for tasks assessed/performed                     Past Medical History:  Diagnosis Date   Dysrhythmia    High cholesterol    Hypertension    IBS (irritable bowel syndrome)    Rheumatoid arthritis (HCC)    Tachycardia    Past Surgical History:  Procedure Laterality Date   BIOPSY  04/14/2022   Procedure: BIOPSY;  Surgeon: Dolores Frame, MD;  [facility]: [facility];  Service: Gastroenterology;;   COLONOSCOPY N/A 10/07/2016   Procedure: COLONOSCOPY;  Surgeon: Malissa Hippo, MD;  Location: [facility];  Service: Endoscopy;  Laterality: N/A;  730   COLONOSCOPY WITH PROPOFOL N/A 04/14/2022   Procedure: COLONOSCOPY WITH PROPOFOL;  Surgeon: Dolores Frame, MD;  [facility]: [facility];  Service: Gastroenterology;  Laterality: N/A;  915am, asa 2   POLYPECTOMY  04/14/2022   Procedure: POLYPECTOMY;  Surgeon: Dolores Frame, MD;  [facility]: [facility];  Service: Gastroenterology;;   RECONSTRUCTION OF EYELID  09/02/2021   REVERSE SHOULDER ARTHROPLASTY Right 11/01/2022   Procedure: RIGHT REVERSE SHOULDER ARTHROPLASTY;  Surgeon: Oliver Barre, MD;  Location: [facility];  Service: Orthopedics;  Laterality: Right;  RNFA NEEDED   TUBAL LIGATION     VAGINAL HYSTERECTOMY N/A 10/07/2021   Procedure: HYSTERECTOMY VAGINAL;  Surgeon: Lazaro Arms, MD;   Location: [facility];  Service: Gynecology;  Laterality: N/A;   Patient Active Problem List   Diagnosis Date Noted   Rotator cuff arthropathy of right shoulder 11/01/2022   Constipation 03/11/2022   Liver hemangioma 03/11/2022   Mixed hyperlipidemia 01/29/2021   Rheumatoid arteritis (HCC) 06/26/2020   Pernicious anemia 06/26/2020   Mild valvular heart disease 06/26/2020   Rheumatoid arthritis with rheumatoid factor of multiple sites without organ or systems involvement (HCC) 09/06/2019   High risk medication use 09/06/2019   Elevated LFTs 09/06/2019   Age-related osteoporosis without current pathological fracture 09/06/2019   Dyslipidemia 08/14/2019   Abnormal electrocardiogram 03/12/2019   Chest pain 03/12/2019   HTN (hypertension) 03/12/2019   Obesity 03/12/2019   Family history of colon cancer 05/28/2016   PCP: Dr. Orpah Cobb REFERRING PROVIDER: Dr. Thane Edu  ONSET DATE: 11/01/22  REFERRING DIAG: W09.811 (ICD-10-CM) - Status post reverse arthroplasty of right shoulder   THERAPY DIAG:  Acute pain of right shoulder  Stiffness of right shoulder, not elsewhere classified  Other symptoms and signs involving the musculoskeletal system  Rationale for Evaluation and Treatment: Rehabilitation  SUBJECTIVE:   SUBJECTIVE STATEMENT: S: My arm has been doing good    PERTINENT HISTORY: Pt is a 73 y/o female s/p right reverse TSA on 11/01/2022. Pt presents in sling without abduction pillow. Has been completing HEP given  by MD.   PRECAUTIONS: Shoulder See protocol.   WEIGHT BEARING RESTRICTIONS: Yes NWB  PAIN:  Are you having pain? Yes: NPRS scale: 1/10 Pain location: bicep area Pain description: aching, sore Aggravating factors: er stretch at doorway Relieving factors: rest    FALLS: Has patient fallen in last 6 months? No  PLOF: Independent  PATIENT GOALS: To be able to use her arm.   NEXT MD VISIT: 04/29/23  OBJECTIVE:  Note: Objective measures were completed  at Evaluation unless otherwise noted.  HAND DOMINANCE: Left  ADLs: Overall ADLs: Pt is having difficulty with dressing, bathing, reaching up to wash and fix her hair. Pt is unable to reach overhead or behind back. Pt has difficulty with lifting tasks such as meal preparation and housework. Pt is sleeping in a lift chair, has tried the bed a couple of nights but is more sore in the mornings.    FUNCTIONAL OUTCOME MEASURES: FOTO: 51/100 01/07/23: 65.67/100  UPPER EXTREMITY ROM:     Passive ROM Right eval  Shoulder flexion 120  Shoulder abduction 121  Shoulder internal rotation 90  Shoulder external rotation 20  (Blank rows = not tested)  Active ROM Right eval Right 01/07/23  Shoulder flexion 83 125  Shoulder abduction 88 156  Shoulder internal rotation 90 90  Shoulder external rotation 20 55  (Blank rows = not tested)   UPPER EXTREMITY MMT:     MMT Right eval Right 01/07/23  Shoulder flexion 3-/5 4/5  Shoulder abduction 3-/5 4-/5  Shoulder internal rotation 3/5 4-/5  Shoulder external rotation 3-/5 3+/5  (Blank rows = not tested)   EDEMA: occasional ice pack use for swelling  OBSERVATIONS: moderate fascial restrictions in right upper arm, anterior shoulder, trapezius, and scapular regions   TODAY'S TREATMENT:                                                                                                                              DATE:  02/04/23 -myofascial release to right upper arm, anterior shoulder, trapezius, and scapular regions to decrease pain and fascial restrictions and increase joint ROM.  -P/ROM: supine-flexion, abduction, er, horizontal abduction, 5 reps -Sidelying A/ROM: protraction, flexion, abduction, er, horizontal abduction, 10 reps -ER stretch: standing, pt putting arm into ER and OT facilitating deeper stretch holding for 10 seconds 3x -Shoulder strength: sitting; 2 pound DB flexion, shoulder press, curls, hammer curls, horizontal abduction  punches across body  -Scapular strength: red theraband: row, extension -Proximal shoulder strengthening: supine-1#-paddles, criss cross, circles each direction, 12 reps   02/02/23 -myofascial release to right upper arm, anterior shoulder, trapezius, and scapular regions to decrease pain and fascial restrictions and increase joint ROM.  -P/ROM: supine-flexion, abduction, er, horizontal abduction, 5 reps -Sidelying A/ROM: protraction, flexion, abduction, er, horizontal abduction, 10 reps -X to V arms, 1#: 10 reps -ABC Writing: shoulder at 90 degrees flexion, 1# weight -Debroah Loop press: A/ROM, 10 reps -Strengthening: sitting-1#-protraction, flexion, abduction, er, horizontal abduction,  10 reps -Proximal shoulder strengthening: supine-1#-paddles, criss cross, circles each direction, 12 reps -Therapy ball strengthening: green ball, standing-chest press, flexion, circles each direction, 10 reps -Ball on wall: 1' flexion, 1' abduction -Ball pass behind back for IR, behind head for er, 10 reps  01/28/23 -myofascial release to right upper arm, anterior shoulder, trapezius, and scapular regions to decrease pain and fascial restrictions and increase joint ROM.  -P/ROM: supine-flexion, abduction, er, horizontal abduction, 5 reps -Sidelying A/ROM: protraction, flexion, abduction, er, horizontal abduction, 10 reps -Strengthening: standing, 1#-protraction, flexion, abduction, er, horizontal abduction, 10 reps -X to V arms, 1#: 10 reps -Ball on wall: 1' flexion, 1' abduction -ABC Writing: shoulder at 90 degrees flexion, 1# weight -Therapy ball strengthening: sitting-basketball, circles each direction, flexion from 90 degrees flexion to overhead position, PNF patterns, 10 reps -Pen pass behind back for IR, behind head for er, 10 reps   PATIENT EDUCATION: Education details: reviewed HEP Person educated: Patient Education method: Programmer, multimedia, Demonstration, and Handouts Education comprehension: verbalized  understanding and returned demonstration  HOME EXERCISE PROGRAM: Eval:  table slides; scapular A/ROM 12/4: AA/ROM 12/20: Scapular strengthening-red band 12/26: A/ROM  1/2: 1-2# weights with shoulder A/ROM 1/3: PNF Strengthening 1/8: Shoulder Stretches  GOALS: Goals reviewed with patient? Yes  SHORT TERM GOALS: Target date: 12/20/22  Pt will be provided with and educated on HEP to improve mobility in RUE required for use during ADL completion.   Goal status: IN PROGRESS  2.  Pt will increase RUE P/ROM by 10+ degrees to improve ability to use RUE during dressing tasks with minimal compensatory techniques.   Goal status: MET   LONG TERM GOALS: Target date: 01/10/23  Pt will decrease pain in RUE to 3/10 or less to improve ability to sleep for 2+ consecutive hours without waking due to pain.   Goal status: IN PROGRESS  2.  Pt will decrease RUE fascial restrictions to min amounts or less to improve mobility required for functional reaching tasks.   Goal status: IN PROGRESS  3.  Pt will increase RUE A/ROM by 40+ degrees to improve ability to use RUE when reaching overhead or behind back during dressing and bathing tasks.   Goal status: IN PROGRESS  4.  Pt will increase RUE strength to 4+/5 or greater to improve ability to use RUE when lifting or carrying items during meal preparation/housework/yardwork tasks.   Goal status: IN PROGRESS   ASSESSMENT:  CLINICAL IMPRESSION: Pt stated that she has bene working on external rotation at home with towel stretch. OT facilitated deeper ER stretch assisting to increase ROM. Pt continues to demonstrate good ROM. Continued side lying strengthening with good form. Increased DB weight to pounds this session. Pt making great progress towards goals.    PERFORMANCE DEFICITS: in functional skills including ADLs, IADLs, edema, ROM, strength, pain, fascial restrictions, and UE functional use     PLAN:  OT FREQUENCY: 2x/week  OT DURATION:  6 weeks  PLANNED INTERVENTIONS: 97168 OT Re-evaluation, 97535 self care/ADL training, 16109 therapeutic exercise, 97530 therapeutic activity, 97140 manual therapy, 97035 ultrasound, 97010 moist heat, 97010 cryotherapy, 97014 electrical stimulation unattended, patient/family education, and DME and/or AE instructions  CONSULTED AND AGREED WITH PLAN OF CARE: Patient  PLAN FOR NEXT SESSION: Follow up on HEP, continue with scapular theraband, stability work; complete strengthening in sidelying   Lurena Joiner, OTR/L  [phone] 02/04/2023, 11:01 AM

## 2023-02-04 NOTE — Patient Instructions (Signed)
 Complete _______ repetitions each. Complete _______ time a day.  Wall taps with theraband  With a looped elastic band around forearms/wrists and arms at a 90 angle pressed against the wall. Keeping elbows on the wall, tap right arm out to the right. Hold for 1 second. Return right arm back to neutral. Repeat with left arm.    Wall V slides with Theraband  Place a band loop around hands/forearms and face a wall. Extend both arms diagonally into a V shape on the wall. Hold this stretch for specified amount of time. Lower arms slowly back into neutral position. Repeat.       scap clocks  Tie a loop with a theraband and place around your wrists.  Stand with a wall in front of you.  Picture a clock in front of you.  Place both palms on the wall, arms straight.  While keeping the left/right hand planted, use the right/left hand to pull away and tap each number (1, 3, 5- right OR 11,9,7 left), coming back to center each time.

## 2023-02-05 ENCOUNTER — Encounter: Payer: Self-pay | Admitting: Physician Assistant

## 2023-02-07 ENCOUNTER — Encounter (HOSPITAL_COMMUNITY): Payer: 59 | Admitting: Occupational Therapy

## 2023-02-09 ENCOUNTER — Encounter (HOSPITAL_COMMUNITY): Payer: 59 | Admitting: Occupational Therapy

## 2023-02-09 ENCOUNTER — Ambulatory Visit (HOSPITAL_COMMUNITY): Payer: 59

## 2023-02-11 ENCOUNTER — Encounter (HOSPITAL_COMMUNITY): Payer: 59 | Admitting: Occupational Therapy

## 2023-02-11 ENCOUNTER — Encounter (HOSPITAL_COMMUNITY): Payer: Self-pay | Admitting: Occupational Therapy

## 2023-02-11 ENCOUNTER — Ambulatory Visit (HOSPITAL_COMMUNITY): Payer: 59 | Attending: Orthopedic Surgery | Admitting: Occupational Therapy

## 2023-02-11 DIAGNOSIS — M25611 Stiffness of right shoulder, not elsewhere classified: Secondary | ICD-10-CM | POA: Diagnosis present

## 2023-02-11 DIAGNOSIS — R29898 Other symptoms and signs involving the musculoskeletal system: Secondary | ICD-10-CM | POA: Insufficient documentation

## 2023-02-11 DIAGNOSIS — M25511 Pain in right shoulder: Secondary | ICD-10-CM | POA: Insufficient documentation

## 2023-02-11 NOTE — Therapy (Signed)
 OUTPATIENT OCCUPATIONAL THERAPY ORTHO TREATMENT   Patient Name: Tammy Jensen MRN: [id number] DOB:1950-12-26, 73 y.o., female Today's Date: 02/11/2023    END OF SESSION:  OT End of Session - 02/11/23 1339     Visit Number 18    Number of Visits 20    Date for OT Re-Evaluation 02/18/23    Authorization Type 1) U[employer] Medicare Dual Complete 2) Medicaid    Authorization Time Period no auth required    Progress Note Due on Visit 20    OT Start Time 1300    OT Stop Time 1340    OT Time Calculation (min) 40 min    Activity Tolerance Patient tolerated treatment well    Behavior During Therapy WFL for tasks assessed/performed                      Past Medical History:  Diagnosis Date   Dysrhythmia    High cholesterol    Hypertension    IBS (irritable bowel syndrome)    Rheumatoid arthritis (HCC)    Tachycardia    Past Surgical History:  Procedure Laterality Date   BIOPSY  04/14/2022   Procedure: BIOPSY;  Surgeon: Eartha Angelia Sieving, MD;  [facility]: [facility];  Service: Gastroenterology;;   COLONOSCOPY N/A 10/07/2016   Procedure: COLONOSCOPY;  Surgeon: Golda Claudis PENNER, MD;  Location: [facility];  Service: Endoscopy;  Laterality: N/A;  730   COLONOSCOPY WITH PROPOFOL  N/A 04/14/2022   Procedure: COLONOSCOPY WITH PROPOFOL ;  Surgeon: Eartha Angelia Sieving, MD;  Location: [facility];  Service: Gastroenterology;  Laterality: N/A;  915am, asa 2   POLYPECTOMY  04/14/2022   Procedure: POLYPECTOMY;  Surgeon: Eartha Angelia Sieving, MD;  Location: [facility];  Service: Gastroenterology;;   RECONSTRUCTION OF EYELID  09/02/2021   REVERSE SHOULDER ARTHROPLASTY Right 11/01/2022   Procedure: RIGHT REVERSE SHOULDER ARTHROPLASTY;  Surgeon: Onesimo Oneil LABOR, MD;  Location: [facility];  Service: Orthopedics;  Laterality: Right;  RNFA NEEDED   TUBAL LIGATION     VAGINAL HYSTERECTOMY N/A 10/07/2021   Procedure: HYSTERECTOMY VAGINAL;  Surgeon: Jayne Vonn DEL, MD;   Location: [facility];  Service: Gynecology;  Laterality: N/A;   Patient Active Problem List   Diagnosis Date Noted   Rotator cuff arthropathy of right shoulder 11/01/2022   Constipation 03/11/2022   Liver hemangioma 03/11/2022   Mixed hyperlipidemia 01/29/2021   Rheumatoid arteritis (HCC) 06/26/2020   Pernicious anemia 06/26/2020   Mild valvular heart disease 06/26/2020   Rheumatoid arthritis with rheumatoid factor of multiple sites without organ or systems involvement (HCC) 09/06/2019   High risk medication use 09/06/2019   Elevated LFTs 09/06/2019   Age-related osteoporosis without current pathological fracture 09/06/2019   Dyslipidemia 08/14/2019   Abnormal electrocardiogram 03/12/2019   Chest pain 03/12/2019   HTN (hypertension) 03/12/2019   Obesity 03/12/2019   Family history of colon cancer 05/28/2016   PCP: Dr. Mariano Lindau REFERRING PROVIDER: Dr. Oneil Onesimo  ONSET DATE: 11/01/22  REFERRING DIAG: S03.388 (ICD-10-CM) - Status post reverse arthroplasty of right shoulder   THERAPY DIAG:  Acute pain of right shoulder  Stiffness of right shoulder, not elsewhere classified  Other symptoms and signs involving the musculoskeletal system  Rationale for Evaluation and Treatment: Rehabilitation  SUBJECTIVE:   SUBJECTIVE STATEMENT: S: It's feeling ok.   PERTINENT HISTORY: Pt is a 73 y/o female s/p right reverse TSA on 11/01/2022. Pt presents in sling without abduction pillow. Has been completing HEP given by MD.  PRECAUTIONS: Shoulder See protocol.   WEIGHT BEARING RESTRICTIONS: Yes NWB  PAIN:  Are you having pain? Yes: NPRS scale: 1/10 Pain location: bicep area Pain description: sore Aggravating factors: lifting or end range stretch Relieving factors: rest    FALLS: Has patient fallen in last 6 months? No  PLOF: Independent  PATIENT GOALS: To be able to use her arm.   NEXT MD VISIT: 04/29/23  OBJECTIVE:  Note: Objective measures were completed at  Evaluation unless otherwise noted.  HAND DOMINANCE: Left  ADLs: Overall ADLs: Pt is having difficulty with dressing, bathing, reaching up to wash and fix her hair. Pt is unable to reach overhead or behind back. Pt has difficulty with lifting tasks such as meal preparation and housework. Pt is sleeping in a lift chair, has tried the bed a couple of nights but is more sore in the mornings.    FUNCTIONAL OUTCOME MEASURES: FOTO: 51/100 01/07/23: 65.67/100  UPPER EXTREMITY ROM:     Passive ROM Right eval  Shoulder flexion 120  Shoulder abduction 121  Shoulder internal rotation 90  Shoulder external rotation 20  (Blank rows = not tested)  Active ROM Right eval Right 01/07/23  Shoulder flexion 83 125  Shoulder abduction 88 156  Shoulder internal rotation 90 90  Shoulder external rotation 20 55  (Blank rows = not tested)   UPPER EXTREMITY MMT:     MMT Right eval Right 01/07/23  Shoulder flexion 3-/5 4/5  Shoulder abduction 3-/5 4-/5  Shoulder internal rotation 3/5 4-/5  Shoulder external rotation 3-/5 3+/5  (Blank rows = not tested)   EDEMA: occasional ice pack use for swelling  OBSERVATIONS: moderate fascial restrictions in right upper arm, anterior shoulder, trapezius, and scapular regions   TODAY'S TREATMENT:                                                                                                                              DATE: 02/11/23 -myofascial release to right upper arm, anterior shoulder, trapezius, and scapular regions to decrease pain and fascial restrictions and increase joint ROM.  -P/ROM: supine-flexion, abduction, er, horizontal abduction, 5 reps -Sidelying strengthening: red theraband- protraction, flexion, abduction, er, horizontal abduction, 10 reps -Strengthening: standing-2#-protraction, flexion, abduction, er, horizontal abduction, 10 reps -Arnold press: A/ROM, 10 reps -X to V arms, 1#: 10 reps -Ball on wall: 1' flexion, 1' abduction -IR  stretch behind back with horizontal towel: 2x30 -Ball pass behind back for IR, behind head for er, 10 reps -Overhead lacing: seated, lacing from top down then reversing, wearing 1# wrist weight -Shoulder strengthening: standing-red theraband: protraction, flexion, abduction, er, horizontal abduction, 10 reps -UBE: level 2, 2' reverse, pace: 8.5  02/04/23 -myofascial release to right upper arm, anterior shoulder, trapezius, and scapular regions to decrease pain and fascial restrictions and increase joint ROM.  -P/ROM: supine-flexion, abduction, er, horizontal abduction, 5 reps -Sidelying A/ROM: protraction, flexion, abduction, er, horizontal abduction, 10 reps -ER stretch: standing, pt putting  arm into ER and OT facilitating deeper stretch holding for 10 seconds 3x -Shoulder strength: sitting; 2 pound DB flexion, shoulder press, curls, hammer curls, horizontal abduction punches across body  -Scapular strength: red theraband: row, extension -Proximal shoulder strengthening: supine-1#-paddles, criss cross, circles each direction, 12 reps  02/02/23 -myofascial release to right upper arm, anterior shoulder, trapezius, and scapular regions to decrease pain and fascial restrictions and increase joint ROM.  -P/ROM: supine-flexion, abduction, er, horizontal abduction, 5 reps -Sidelying A/ROM: protraction, flexion, abduction, er, horizontal abduction, 10 reps -X to V arms, 1#: 10 reps -ABC Writing: shoulder at 90 degrees flexion, 1# weight -Eveline press: A/ROM, 10 reps -Strengthening: sitting-1#-protraction, flexion, abduction, er, horizontal abduction, 10 reps -Proximal shoulder strengthening: supine-1#-paddles, criss cross, circles each direction, 12 reps -Therapy ball strengthening: green ball, standing-chest press, flexion, circles each direction, 10 reps -Ball on wall: 1' flexion, 1' abduction -Ball pass behind back for IR, behind head for er, 10 reps    PATIENT EDUCATION: Education details:  reviewed HEP Person educated: Patient Education method: Programmer, Multimedia, Demonstration, and Handouts Education comprehension: verbalized understanding and returned demonstration  HOME EXERCISE PROGRAM: Eval:  table slides; scapular A/ROM 12/4: AA/ROM 12/20: Scapular strengthening-red band 12/26: A/ROM  1/2: 1-2# weights with shoulder A/ROM 1/3: PNF Strengthening 1/8: Shoulder Stretches  GOALS: Goals reviewed with patient? Yes  SHORT TERM GOALS: Target date: 12/20/22  Pt will be provided with and educated on HEP to improve mobility in RUE required for use during ADL completion.   Goal status: IN PROGRESS  2.  Pt will increase RUE P/ROM by 10+ degrees to improve ability to use RUE during dressing tasks with minimal compensatory techniques.   Goal status: MET   LONG TERM GOALS: Target date: 01/10/23  Pt will decrease pain in RUE to 3/10 or less to improve ability to sleep for 2+ consecutive hours without waking due to pain.   Goal status: IN PROGRESS  2.  Pt will decrease RUE fascial restrictions to min amounts or less to improve mobility required for functional reaching tasks.   Goal status: IN PROGRESS  3.  Pt will increase RUE A/ROM by 40+ degrees to improve ability to use RUE when reaching overhead or behind back during dressing and bathing tasks.   Goal status: IN PROGRESS  4.  Pt will increase RUE strength to 4+/5 or greater to improve ability to use RUE when lifting or carrying items during meal preparation/housework/yardwork tasks.   Goal status: IN PROGRESS   ASSESSMENT:  CLINICAL IMPRESSION: Pt states that she is using a mirror now and is working on her posture when completing her HEP. Continued with manual techniques at beginning of session, added theraband to sidelying and standing exercises for strengthening and postural control. Pt with mod difficulty when fatigued, rest breaks provided as needed throughout session. Improvement noted with IR stretch and ball  pass tasks as well. ROM at 75%+ with strengthening tasks.    PERFORMANCE DEFICITS: in functional skills including ADLs, IADLs, edema, ROM, strength, pain, fascial restrictions, and UE functional use     PLAN:  OT FREQUENCY: 2x/week  OT DURATION: 6 weeks  PLANNED INTERVENTIONS: 97168 OT Re-evaluation, 97535 self care/ADL training, 02889 therapeutic exercise, 97530 therapeutic activity, 97140 manual therapy, 97035 ultrasound, 97010 moist heat, 97010 cryotherapy, 97014 electrical stimulation unattended, patient/family education, and DME and/or AE instructions  CONSULTED AND AGREED WITH PLAN OF CARE: Patient  PLAN FOR NEXT SESSION: Follow up on HEP, continue with scapular theraband, stability work; complete strengthening in  sidelying   Sonny Cory, OTR/L  [phone] 02/11/2023, 1:46 PM

## 2023-02-14 ENCOUNTER — Telehealth: Payer: Self-pay | Admitting: *Deleted

## 2023-02-14 DIAGNOSIS — Z79899 Other long term (current) drug therapy: Secondary | ICD-10-CM

## 2023-02-14 DIAGNOSIS — M0579 Rheumatoid arthritis with rheumatoid factor of multiple sites without organ or systems involvement: Secondary | ICD-10-CM

## 2023-02-14 NOTE — Telephone Encounter (Signed)
 Patient contacted the office and states the place she has been going to have her eyes checked no longer takes her insurance. Patient would like to know of somewhere she can go that accepts Medicaid. Patient advised she can try [facility]. Patient asked for a referral to be placed. Referral placed as requested.

## 2023-02-16 ENCOUNTER — Encounter (HOSPITAL_COMMUNITY): Payer: Self-pay | Admitting: Occupational Therapy

## 2023-02-16 ENCOUNTER — Ambulatory Visit (HOSPITAL_COMMUNITY): Payer: 59 | Admitting: Occupational Therapy

## 2023-02-16 DIAGNOSIS — M25611 Stiffness of right shoulder, not elsewhere classified: Secondary | ICD-10-CM

## 2023-02-16 DIAGNOSIS — M25511 Pain in right shoulder: Secondary | ICD-10-CM

## 2023-02-16 DIAGNOSIS — R29898 Other symptoms and signs involving the musculoskeletal system: Secondary | ICD-10-CM

## 2023-02-16 NOTE — Therapy (Signed)
OUTPATIENT OCCUPATIONAL THERAPY ORTHO TREATMENT   Patient Name: Tammy Jensen MRN: [id number] DOB:[birth year], 73 y.o., female Today's Date: 02/16/2023    END OF SESSION:  OT End of Session - 02/16/23 1127     Visit Number 19    Number of Visits 20    Date for OT Re-Evaluation 02/18/23    Authorization Type 1) UHC Medicare Dual Complete 2) Medicaid    Authorization Time Period no auth required    Progress Note Due on Visit 20    OT Start Time 1048    OT Stop Time 1131    OT Time Calculation (min) 43 min    Activity Tolerance Patient tolerated treatment well    Behavior During Therapy WFL for tasks assessed/performed                       Past Medical History:  Diagnosis Date   Dysrhythmia    High cholesterol    Hypertension    IBS (irritable bowel syndrome)    Rheumatoid arthritis (HCC)    Tachycardia    Past Surgical History:  Procedure Laterality Date   BIOPSY  04/14/2022   Procedure: BIOPSY;  Surgeon: Dolores Frame, MD;  [facility]: [facility];  Service: Gastroenterology;;   COLONOSCOPY N/A 10/07/2016   Procedure: COLONOSCOPY;  Surgeon: Malissa Hippo, MD;  [facility]: [facility];  Service: Endoscopy;  Laterality: N/A;  730   COLONOSCOPY WITH PROPOFOL N/A 04/14/2022   Procedure: COLONOSCOPY WITH PROPOFOL;  Surgeon: Dolores Frame, MD;  [facility]: [facility];  Service: Gastroenterology;  Laterality: N/A;  915am, asa 2   POLYPECTOMY  04/14/2022   Procedure: POLYPECTOMY;  Surgeon: Dolores Frame, MD;  [facility]: [facility];  Service: Gastroenterology;;   RECONSTRUCTION OF EYELID  09/02/2021   REVERSE SHOULDER ARTHROPLASTY Right 11/01/2022   Procedure: RIGHT REVERSE SHOULDER ARTHROPLASTY;  Surgeon: Oliver Barre, MD;  Location: [facility];  Service: Orthopedics;  Laterality: Right;  RNFA NEEDED   TUBAL LIGATION     VAGINAL HYSTERECTOMY N/A 10/07/2021   Procedure: HYSTERECTOMY VAGINAL;  Surgeon: Lazaro Arms, MD;   Location: [facility];  Service: Gynecology;  Laterality: N/A;   Patient Active Problem List   Diagnosis Date Noted   Rotator cuff arthropathy of right shoulder 11/01/2022   Constipation 03/11/2022   Liver hemangioma 03/11/2022   Mixed hyperlipidemia 01/29/2021   Rheumatoid arteritis (HCC) 06/26/2020   Pernicious anemia 06/26/2020   Mild valvular heart disease 06/26/2020   Rheumatoid arthritis with rheumatoid factor of multiple sites without organ or systems involvement (HCC) 09/06/2019   High risk medication use 09/06/2019   Elevated LFTs 09/06/2019   Age-related osteoporosis without current pathological fracture 09/06/2019   Dyslipidemia 08/14/2019   Abnormal electrocardiogram 03/12/2019   Chest pain 03/12/2019   HTN (hypertension) 03/12/2019   Obesity 03/12/2019   Family history of colon cancer 05/28/2016   PCP: Dr. Orpah Cobb REFERRING PROVIDER: Dr. Thane Edu  ONSET DATE: 11/01/22  REFERRING DIAG: N82.956 (ICD-10-CM) - Status post reverse arthroplasty of right shoulder   THERAPY DIAG:  Acute pain of right shoulder  Stiffness of right shoulder, not elsewhere classified  Other symptoms and signs involving the musculoskeletal system  Rationale for Evaluation and Treatment: Rehabilitation  SUBJECTIVE:   SUBJECTIVE STATEMENT: S: "I still can't get my shoulder behind me but otherwise it's feeling pretty good."   PERTINENT HISTORY: Pt is a 73 y/o female s/p right reverse TSA on 11/01/2022. Pt presents in  sling without abduction pillow. Has been completing HEP given by MD.   PRECAUTIONS: Shoulder See protocol.   WEIGHT BEARING RESTRICTIONS: Yes NWB  PAIN:  Are you having pain? Yes: NPRS scale: 1/10 Pain location: bicep area Pain description: sore Aggravating factors: lifting or end range stretch Relieving factors: rest    FALLS: Has patient fallen in last 6 months? No  PLOF: Independent  PATIENT GOALS: To be able to use her arm.   NEXT MD VISIT:  04/29/23  OBJECTIVE:  Note: Objective measures were completed at Evaluation unless otherwise noted.  HAND DOMINANCE: Left  ADLs: Overall ADLs: Pt is having difficulty with dressing, bathing, reaching up to wash and fix her hair. Pt is unable to reach overhead or behind back. Pt has difficulty with lifting tasks such as meal preparation and housework. Pt is sleeping in a lift chair, has tried the bed a couple of nights but is more sore in the mornings.    FUNCTIONAL OUTCOME MEASURES: FOTO: 51/100 01/07/23: 65.67/100  UPPER EXTREMITY ROM:     Passive ROM Right eval  Shoulder flexion 120  Shoulder abduction 121  Shoulder internal rotation 90  Shoulder external rotation 20  (Blank rows = not tested)  Active ROM Right eval Right 01/07/23  Shoulder flexion 83 125  Shoulder abduction 88 156  Shoulder internal rotation 90 90  Shoulder external rotation 20 55  (Blank rows = not tested)   UPPER EXTREMITY MMT:     MMT Right eval Right 01/07/23  Shoulder flexion 3-/5 4/5  Shoulder abduction 3-/5 4-/5  Shoulder internal rotation 3/5 4-/5  Shoulder external rotation 3-/5 3+/5  (Blank rows = not tested)   EDEMA: occasional ice pack use for swelling  OBSERVATIONS: moderate fascial restrictions in right upper arm, anterior shoulder, trapezius, and scapular regions   TODAY'S TREATMENT:                                                                                                                              DATE: 02/16/23 -myofascial release to right upper arm, anterior shoulder, trapezius, and scapular regions to decrease pain and fascial restrictions and increase joint ROM.  -P/ROM: supine-flexion, abduction, er, horizontal abduction, 5 reps -Sidelying strengthening: red theraband- protraction, flexion, abduction, er, horizontal abduction, 12 reps -Shoulder stretches: flexion, IR behind back with horizontal towel, doorway stretch, 2x30" holds -Shoulder strengthening: standing-red  theraband: protraction, flexion, abduction, er, horizontal abduction, 12 reps -Therapy ball strengthening: green therapy ball-chest press, overhead press, flexion, circles each direction, 10 reps -UBE: level 2, 2' forward, 2' reverse, pace: 8.0  02/11/23 -myofascial release to right upper arm, anterior shoulder, trapezius, and scapular regions to decrease pain and fascial restrictions and increase joint ROM.  -P/ROM: supine-flexion, abduction, er, horizontal abduction, 5 reps -Sidelying strengthening: red theraband- protraction, flexion, abduction, er, horizontal abduction, 10 reps -Strengthening: standing-2#-protraction, flexion, abduction, er, horizontal abduction, 10 reps -Arnold press: A/ROM, 10 reps -X to V arms, 1#: 10 reps -  Ball on wall: 1' flexion, 1' abduction -IR stretch behind back with horizontal towel: 2x30" -Ball pass behind back for IR, behind head for er, 10 reps -Overhead lacing: seated, lacing from top down then reversing, wearing 1# wrist weight -Shoulder strengthening: standing-red theraband: protraction, flexion, abduction, er, horizontal abduction, 10 reps -UBE: level 2, 2' reverse, pace: 8.5  02/04/23 -myofascial release to right upper arm, anterior shoulder, trapezius, and scapular regions to decrease pain and fascial restrictions and increase joint ROM.  -P/ROM: supine-flexion, abduction, er, horizontal abduction, 5 reps -Sidelying A/ROM: protraction, flexion, abduction, er, horizontal abduction, 10 reps -ER stretch: standing, pt putting arm into ER and OT facilitating deeper stretch holding for 10 seconds 3x -Shoulder strength: sitting; 2 pound DB flexion, shoulder press, curls, hammer curls, horizontal abduction punches across body  -Scapular strength: red theraband: row, extension -Proximal shoulder strengthening: supine-1#-paddles, criss cross, circles each direction, 12 reps    PATIENT EDUCATION: Education details: reviewed HEP Person educated:  Patient Education method: Explanation, Demonstration, and Handouts Education comprehension: verbalized understanding and returned demonstration  HOME EXERCISE PROGRAM: Eval:  table slides; scapular A/ROM 12/4: AA/ROM 12/20: Scapular strengthening-red band 12/26: A/ROM  1/2: 1-2# weights with shoulder A/ROM 1/3: PNF Strengthening 1/8: Shoulder Stretches  GOALS: Goals reviewed with patient? Yes  SHORT TERM GOALS: Target date: 12/20/22  Pt will be provided with and educated on HEP to improve mobility in RUE required for use during ADL completion.   Goal status: IN PROGRESS  2.  Pt will increase RUE P/ROM by 10+ degrees to improve ability to use RUE during dressing tasks with minimal compensatory techniques.   Goal status: MET   LONG TERM GOALS: Target date: 01/10/23  Pt will decrease pain in RUE to 3/10 or less to improve ability to sleep for 2+ consecutive hours without waking due to pain.   Goal status: IN PROGRESS  2.  Pt will decrease RUE fascial restrictions to min amounts or less to improve mobility required for functional reaching tasks.   Goal status: IN PROGRESS  3.  Pt will increase RUE A/ROM by 40+ degrees to improve ability to use RUE when reaching overhead or behind back during dressing and bathing tasks.   Goal status: IN PROGRESS  4.  Pt will increase RUE strength to 4+/5 or greater to improve ability to use RUE when lifting or carrying items during meal preparation/housework/yardwork tasks.   Goal status: IN PROGRESS   ASSESSMENT:  CLINICAL IMPRESSION: Pt states that she feels a pull in her medial deltoid with end range flexion stretch, like it is stretching. Continued with shoulder stability work using red theraband in sidelying and standing, focus on posture and correct form during exercises as well. Added therapy ball strengthening today, fatigue noted with flexion with biceps kicking in to assist. Rest breaks provided as needed, improved form noted  today. Pt reports she has been using a mirror at home. Pt noting improvement with IR behind back-at end of session able to reach to mid-back which she was previously unable to do.    PERFORMANCE DEFICITS: in functional skills including ADLs, IADLs, edema, ROM, strength, pain, fascial restrictions, and UE functional use     PLAN:  OT FREQUENCY: 2x/week  OT DURATION: 6 weeks  PLANNED INTERVENTIONS: 97168 OT Re-evaluation, 97535 self care/ADL training, 16109 therapeutic exercise, 97530 therapeutic activity, 97140 manual therapy, 97035 ultrasound, 97010 moist heat, 97010 cryotherapy, 97014 electrical stimulation unattended, patient/family education, and DME and/or AE instructions  CONSULTED AND AGREED WITH PLAN OF  CARE: Patient  PLAN FOR NEXT SESSION: Reassessment, determine if pt ready to discharge with HEP or needing to continue for a short time.    Ezra Sites, OTR/L  [phone] 02/16/2023, 11:32 AM

## 2023-02-18 ENCOUNTER — Ambulatory Visit (HOSPITAL_COMMUNITY): Payer: 59 | Admitting: Occupational Therapy

## 2023-02-18 ENCOUNTER — Encounter (HOSPITAL_COMMUNITY): Payer: Self-pay | Admitting: Occupational Therapy

## 2023-02-18 ENCOUNTER — Encounter (HOSPITAL_COMMUNITY): Payer: 59 | Admitting: Occupational Therapy

## 2023-02-18 DIAGNOSIS — R29898 Other symptoms and signs involving the musculoskeletal system: Secondary | ICD-10-CM

## 2023-02-18 DIAGNOSIS — M25511 Pain in right shoulder: Secondary | ICD-10-CM

## 2023-02-18 DIAGNOSIS — M25611 Stiffness of right shoulder, not elsewhere classified: Secondary | ICD-10-CM

## 2023-02-18 NOTE — Therapy (Signed)
 OUTPATIENT OCCUPATIONAL THERAPY ORTHO TREATMENT AND REASSESSMENT   Patient Name: Tammy Jensen MRN: [id number] DOB:04-Jan-1951, 73 y.o., female Today's Date: 02/18/2023    END OF SESSION:  OT End of Session - 02/18/23 1405     Visit Number 20    Number of Visits 20    Authorization Type 1) UHC Medicare Dual Complete 2) Medicaid    Authorization Time Period no auth required    Progress Note Due on Visit 20    OT Start Time 1300    OT Stop Time 1339    OT Time Calculation (min) 39 min    Activity Tolerance Patient tolerated treatment well    Behavior During Therapy WFL for tasks assessed/performed                        Past Medical History:  Diagnosis Date   Dysrhythmia    High cholesterol    Hypertension    IBS (irritable bowel syndrome)    Rheumatoid arthritis (HCC)    Tachycardia    Past Surgical History:  Procedure Laterality Date   BIOPSY  04/14/2022   Procedure: BIOPSY;  Surgeon: Dolores Frame, MD;  [facility];  Service: Gastroenterology;;   COLONOSCOPY N/A 10/07/2016   Procedure: COLONOSCOPY;  Surgeon: Malissa Hippo, MD;  [facility];  Service: Endoscopy;  Laterality: N/A;  730   COLONOSCOPY WITH PROPOFOL N/A 04/14/2022   Procedure: COLONOSCOPY WITH PROPOFOL;  Surgeon: Dolores Frame, MD;  [facility];  Service: Gastroenterology;  Laterality: N/A;  915am, asa 2   POLYPECTOMY  04/14/2022   Procedure: POLYPECTOMY;  Surgeon: Dolores Frame, MD;  [facility];  Service: Gastroenterology;;   RECONSTRUCTION OF EYELID  09/02/2021   REVERSE SHOULDER ARTHROPLASTY Right 11/01/2022   Procedure: RIGHT REVERSE SHOULDER ARTHROPLASTY;  Surgeon: Oliver Barre, MD;  Location: [facility];  Service: Orthopedics;  Laterality: Right;  RNFA NEEDED   TUBAL LIGATION     VAGINAL HYSTERECTOMY N/A 10/07/2021   Procedure: HYSTERECTOMY VAGINAL;  Surgeon: Lazaro Arms, MD;  Location: [facility];   Service: Gynecology;  Laterality: N/A;   Patient Active Problem List   Diagnosis Date Noted   Rotator cuff arthropathy of right shoulder 11/01/2022   Constipation 03/11/2022   Liver hemangioma 03/11/2022   Mixed hyperlipidemia 01/29/2021   Rheumatoid arteritis (HCC) 06/26/2020   Pernicious anemia 06/26/2020   Mild valvular heart disease 06/26/2020   Rheumatoid arthritis with rheumatoid factor of multiple sites without organ or systems involvement (HCC) 09/06/2019   High risk medication use 09/06/2019   Elevated LFTs 09/06/2019   Age-related osteoporosis without current pathological fracture 09/06/2019   Dyslipidemia 08/14/2019   Abnormal electrocardiogram 03/12/2019   Chest pain 03/12/2019   HTN (hypertension) 03/12/2019   Obesity 03/12/2019   Family history of colon cancer 05/28/2016   PCP: Dr. Orpah Cobb REFERRING PROVIDER: Dr. Thane Edu  ONSET DATE: 11/01/22  REFERRING DIAG: W09.811 (ICD-10-CM) - Status post reverse arthroplasty of right shoulder   THERAPY DIAG:  Acute pain of right shoulder  Stiffness of right shoulder, not elsewhere classified  Other symptoms and signs involving the musculoskeletal system  Rationale for Evaluation and Treatment: Rehabilitation  SUBJECTIVE:   SUBJECTIVE STATEMENT: S: "I still can't get my shoulder behind me but otherwise it's feeling pretty good."   PERTINENT HISTORY: Pt is a 73 y/o female s/p right reverse TSA on 11/01/2022. Pt presents in sling without abduction pillow. Has  been completing HEP given by MD.   PRECAUTIONS: Shoulder See protocol.   WEIGHT BEARING RESTRICTIONS: Yes NWB  PAIN:  Are you having pain? Yes: NPRS scale: 1/10 Pain location: bicep area Pain description: sore Aggravating factors: lifting or end range stretch Relieving factors: rest    FALLS: Has patient fallen in last 6 months? No  PLOF: Independent  PATIENT GOALS: To be able to use her arm.   NEXT MD VISIT: 04/29/23  OBJECTIVE:  Note:  Objective measures were completed at Evaluation unless otherwise noted.  HAND DOMINANCE: Left  ADLs: Overall ADLs: Pt is having difficulty with dressing, bathing, reaching up to wash and fix her hair. Pt is unable to reach overhead or behind back. Pt has difficulty with lifting tasks such as meal preparation and housework. Pt is sleeping in a lift chair, has tried the bed a couple of nights but is more sore in the mornings.    FUNCTIONAL OUTCOME MEASURES: FOTO: 51/100 01/07/23: 65.67/100  UPPER EXTREMITY ROM:     Passive ROM Right eval  Shoulder flexion 120  Shoulder abduction 121  Shoulder internal rotation 90  Shoulder external rotation 20  (Blank rows = not tested)  Active ROM Right eval Right 01/07/23 Right  2/15  Shoulder flexion 83 125 140  Shoulder abduction 88 156 168  Shoulder internal rotation 90 90 90  Shoulder external rotation 20 55 80  (Blank rows = not tested)   UPPER EXTREMITY MMT:     MMT Right eval Right 01/07/23 Right  2/14  Shoulder flexion 3-/5 4/5 4+/5  Shoulder abduction 3-/5 4-/5 4/5  Shoulder internal rotation 3/5 4-/5 4/5  Shoulder external rotation 3-/5 3+/5 4+/5  (Blank rows = not tested)   EDEMA: occasional ice pack use for swelling  OBSERVATIONS: moderate fascial restrictions in right upper arm, anterior shoulder, trapezius, and scapular regions   TODAY'S TREATMENT:                                                                                                                              DATE:  02/18/23 -myofascial release to right upper arm, anterior shoulder, trapezius, and scapular regions to decrease pain and fascial restrictions and increase joint ROM.  - Reassessment measurements for ROM and strength  - shoulder strengthening: 3 lb DB, curls, shoulder press, flexion, abduction punches 10x each  -UBE: level 2, 2' forward, 2' reverse, pace: 8.0  02/16/23 -myofascial release to right upper arm, anterior shoulder, trapezius, and  scapular regions to decrease pain and fascial restrictions and increase joint ROM.  -P/ROM: supine-flexion, abduction, er, horizontal abduction, 5 reps -Sidelying strengthening: red theraband- protraction, flexion, abduction, er, horizontal abduction, 12 reps -Shoulder stretches: flexion, IR behind back with horizontal towel, doorway stretch, 2x30" holds -Shoulder strengthening: standing-red theraband: protraction, flexion, abduction, er, horizontal abduction, 12 reps -Therapy ball strengthening: green therapy ball-chest press, overhead press, flexion, circles each direction, 10 reps -UBE: level 2, 2' forward, 2' reverse, pace:  8.0  02/11/23 -myofascial release to right upper arm, anterior shoulder, trapezius, and scapular regions to decrease pain and fascial restrictions and increase joint ROM.  -P/ROM: supine-flexion, abduction, er, horizontal abduction, 5 reps -Sidelying strengthening: red theraband- protraction, flexion, abduction, er, horizontal abduction, 10 reps -Strengthening: standing-2#-protraction, flexion, abduction, er, horizontal abduction, 10 reps -Arnold press: A/ROM, 10 reps -X to V arms, 1#: 10 reps -Ball on wall: 1' flexion, 1' abduction -IR stretch behind back with horizontal towel: 2x30" -Ball pass behind back for IR, behind head for er, 10 reps -Overhead lacing: seated, lacing from top down then reversing, wearing 1# wrist weight -Shoulder strengthening: standing-red theraband: protraction, flexion, abduction, er, horizontal abduction, 10 reps -UBE: level 2, 2' reverse, pace: 8.5   PATIENT EDUCATION: Education details: reviewed HEP Person educated: Patient Education method: Explanation, Demonstration, and Handouts Education comprehension: verbalized understanding and returned demonstration  HOME EXERCISE PROGRAM: Eval:  table slides; scapular A/ROM 12/4: AA/ROM 12/20: Scapular strengthening-red band 12/26: A/ROM  1/2: 1-2# weights with shoulder A/ROM 1/3: PNF  Strengthening 1/8: Shoulder Stretches  GOALS: Goals reviewed with patient? Yes  SHORT TERM GOALS: Target date: 12/20/22  Pt will be provided with and educated on HEP to improve mobility in RUE required for use during ADL completion.   Goal status: MET  2.  Pt will increase RUE P/ROM by 10+ degrees to improve ability to use RUE during dressing tasks with minimal compensatory techniques.   Goal status: MET   LONG TERM GOALS: Target date: 01/10/23  Pt will decrease pain in RUE to 3/10 or less to improve ability to sleep for 2+ consecutive hours without waking due to pain.   Goal status: MET  2.  Pt will decrease RUE fascial restrictions to min amounts or less to improve mobility required for functional reaching tasks.   Goal status: MET  3.  Pt will increase RUE A/ROM by 40+ degrees to improve ability to use RUE when reaching overhead or behind back during dressing and bathing tasks.   Goal status: MET  4.  Pt will increase RUE strength to 4+/5 or greater to improve ability to use RUE when lifting or carrying items during meal preparation/housework/yardwork tasks.   Goal status: MET   ASSESSMENT:  CLINICAL IMPRESSION: Re assessment completed today. Pt making great improvements with ROM and strength. Went over HEP with pt and she stated that she feels comfortable with completing them at home. Discussed discharge with pt due to great progress, pt verbalized understanding. Pt instructed to call for a new referral if she feels any regression with strength or movement.    PERFORMANCE DEFICITS: in functional skills including ADLs, IADLs, edema, ROM, strength, pain, fascial restrictions, and UE functional use  OCCUPATIONAL THERAPY DISCHARGE SUMMARY  Visits from Start of Care: 20  Current functional level related to goals / functional outcomes: Goals have been met    Remaining deficits: none   Education / Equipment: Educated on HEP    Patient agrees to discharge. Patient  goals were met. Patient is being discharged due to meeting the stated rehab goals.Marland Kitchen      PLAN:  OT FREQUENCY: 2x/week  OT DURATION: 6 weeks  PLANNED INTERVENTIONS: 97168 OT Re-evaluation, 97535 self care/ADL training, 16109 therapeutic exercise, 97530 therapeutic activity, 97140 manual therapy, 97035 ultrasound, 97010 moist heat, 97010 cryotherapy, 97014 electrical stimulation unattended, patient/family education, and DME and/or AE instructions  CONSULTED AND AGREED WITH PLAN OF CARE: Patient  PLAN FOR NEXT SESSION: Reassessment, determine if pt ready to  discharge with HEP or needing to continue for a short time.    Lurena Joiner, OTR/L  [phone] 02/18/2023, 2:07 PM

## 2023-03-16 ENCOUNTER — Telehealth (INDEPENDENT_AMBULATORY_CARE_PROVIDER_SITE_OTHER): Payer: Self-pay | Admitting: Gastroenterology

## 2023-03-16 NOTE — Telephone Encounter (Signed)
 I called patient to get more information. Last bm 7 days ago. States she is having discomfort in her abdomen and pain in her back. Straining to have BM and has bright red blood. Started about one week ago. States rectal area is swollen. No fever. I advised her to go to ED and not wait for her appt on Tuesday and she declined to go to ED and wanted me to ask if there was anything she could take at home. I told her I would ask but again I advised her she should not wait and go to ED. Last seen over one year march 2024.

## 2023-03-16 NOTE — Telephone Encounter (Signed)
 Pt called to make appointment. She is coming to see Doylene Bode, NP on Tuesday. She is having abdominal bloating, constipation with bleeding and was asking for recommendations to hold her until her OV on Tuesday. Please advise. [phone]

## 2023-03-16 NOTE — Telephone Encounter (Signed)
 She states no nausea and vomiting. Takes miralax one capful daily, last Tuesday she took miralax, dulcolax, exlax, a bottle of liquid stuff from pharmacy that she does not know what it was, but since then just the miralax.

## 2023-03-17 NOTE — Telephone Encounter (Signed)
 Discussed with patient and she states she ate a salad last night and had a good BM afterwards and states pain is better and she does not want the prep but will do 3 capfuls of miralax and aware she should go to ED if fever, worsening abdominal pain, vomiting. She will keep appt on Tuesday.

## 2023-03-22 ENCOUNTER — Encounter (INDEPENDENT_AMBULATORY_CARE_PROVIDER_SITE_OTHER): Payer: Self-pay | Admitting: Gastroenterology

## 2023-03-22 ENCOUNTER — Ambulatory Visit (INDEPENDENT_AMBULATORY_CARE_PROVIDER_SITE_OTHER): Admitting: Gastroenterology

## 2023-03-22 ENCOUNTER — Encounter: Payer: Self-pay | Admitting: Physician Assistant

## 2023-03-22 VITALS — BP 123/79 | HR 76 | Temp 98.0°F | Ht 62.0 in | Wt 166.7 lb

## 2023-03-22 DIAGNOSIS — Z860101 Personal history of adenomatous and serrated colon polyps: Secondary | ICD-10-CM | POA: Diagnosis not present

## 2023-03-22 DIAGNOSIS — K5900 Constipation, unspecified: Secondary | ICD-10-CM

## 2023-03-22 MED ORDER — PEG 3350-KCL-NA BICARB-NACL 420 G PO SOLR: 4000.0000 mL | Freq: Once | ORAL | 0 refills | Status: AC

## 2023-03-22 MED ORDER — LUBIPROSTONE 24 MCG PO CAPS: 24.0000 ug | ORAL_CAPSULE | Freq: Two times a day (BID) | ORAL | 1 refills | Status: DC

## 2023-03-22 NOTE — Patient Instructions (Signed)
-  complete bowel prep today  -start amitiza twice daily after bowel prep -Increase water intake, aim for atleast 64 oz per day -Increase fruits, veggies and whole grains, kiwi and prunes are especially good for constipation  Follow up 2 months  It was a pleasure to see you today. I want to create trusting relationships with patients and provide genuine, compassionate, and quality care. I truly value your feedback! please be on the lookout for a survey regarding your visit with me today. I appreciate your input about our visit and your time in completing this!    Choice Kleinsasser L. Jeanmarie Hubert, MSN, APRN, AGNP-C Adult-Gerontology Nurse Practitioner [facility]

## 2023-03-22 NOTE — Progress Notes (Addendum)
 Referring Provider: Joana Reamer, DO Primary Care Physician:  Joana Reamer, DO Primary GI Physician: Dr. Levon Hedger   Chief Complaint  Patient presents with   Constipation    Patient here today due to having issues with constipation. She takes miralax tid for a while, and this is not working. She says she feels the urge to have a bm, but when she gets there the stool will not come out. She says she has a swollen rectum and has had to digitally remove stool from her rectum.    Abdominal Pain    Patient also having issues with upper abdominal pain and bloating.    HPI:   Tammy Jensen is a 73 y.o. female with past medical history of high cholesterol, dysrhythmia, HTN, IBS, RA   Patient presenting today for:  Constipation Abdominal pain Bloating   Patient last seen march 2024, at that time patient reported history of IBS.  More constipation recently, had about a 10 days where she did not have a BM.  Did not use MiraLAX, Dulcolax, milk of magnesia, Geritol without any results.  Was given lactulose in the ED and told to take MiraLAX 1-2 capfuls per day as well as Colace.  She reported abdominal discomfort and feelings of fullness.  Had recently started semaglutide for weight loss.  Patient recommended to schedule colonoscopy, check TSH, MiraLAX 3 capfuls a day, discuss repeat MRI liver with contrast.  TSH march 2024 2.11  Present:  Patient reports ongoing constipation. Was taking miralax as previously recommended, 1 capful per day, having a BM maybe 1-2 times per week, a lot of times having to strain. Last week she called and stated she had been almost 7 days without a BM. She had taken miralax, dulcolax, mag citrate at that time. She notes she had to put a glove on with some vaseline on and had to manually disimpact herself and eventually had a good BM after she ate a salad but states that she is feeling constipated again. She notes her rectal area feels like she has a lot of  pressure. She feels full/bloated like she cannot eat. Last BM was Friday, small BM, states stools were loose then. She is trying to drink 4-6 eight oz glasses of water per day, and is eating prunes as well. She notes that she had some rectal bleeding after straining last week, she noted some ongoing mild bleeding a few restroom trips after this but this eventually subsided. She also endorses a lot of flatulence.   She has recently started going to a nutritionist and has recommended she drink room temperature water as well as trying to do a colorful diet with plenty of fruits and veggies.   Last Colonoscopy: 04/2022 Two 3 to 4 mm polyps in the transverse colon,                            removed with a cold snare. Resected and retrieved.                           - Two 1 mm polyps in the transverse colon, removed                            with a cold biopsy forceps. Resected and retrieved.                           -  Diverticulosis in the sigmoid colon.                           - Non-bleeding internal hemorrhoids. A. COLON, TRANSVERSE, POLYPECTOMY:  - TUBULAR ADENOMA(S) (3 FRAGMENTS)  - POLYPOID COLONIC MUCOSA WITH LYMPHOID AGGREGATE (1 FRAGMENT)  - NEGATIVE FOR HIGH-GRADE DYSPLASIA OR MALIGNANCY  Last Endoscopy:2013   Repeat colonoscopy 3 years   Filed Weights   03/22/23 0843  Weight: 166 lb 11.2 oz (75.6 kg)     Past Medical History:  Diagnosis Date   Dysrhythmia    High cholesterol    Hypertension    IBS (irritable bowel syndrome)    Rheumatoid arthritis (HCC)    Tachycardia     Past Surgical History:  Procedure Laterality Date   BIOPSY  04/14/2022   Procedure: BIOPSY;  Surgeon: Dolores Frame, MD;  [facility];  Service: Gastroenterology;;   COLONOSCOPY N/A 10/07/2016   Procedure: COLONOSCOPY;  Surgeon: Malissa Hippo, MD;  [facility];  Service: Endoscopy;  Laterality: N/A;  730   COLONOSCOPY WITH PROPOFOL N/A 04/14/2022   Procedure:  COLONOSCOPY WITH PROPOFOL;  Surgeon: Dolores Frame, MD;  [facility];  Service: Gastroenterology;  Laterality: N/A;  915am, asa 2   POLYPECTOMY  04/14/2022   Procedure: POLYPECTOMY;  Surgeon: Dolores Frame, MD;  [facility];  Service: Gastroenterology;;   RECONSTRUCTION OF EYELID  09/02/2021   REVERSE SHOULDER ARTHROPLASTY Right 11/01/2022   Procedure: RIGHT REVERSE SHOULDER ARTHROPLASTY;  Surgeon: Oliver Barre, MD;  Location: [facility];  Service: Orthopedics;  Laterality: Right;  RNFA NEEDED   TUBAL LIGATION     VAGINAL HYSTERECTOMY N/A 10/07/2021   Procedure: HYSTERECTOMY VAGINAL;  Surgeon: Lazaro Arms, MD;  Location: [facility];  Service: Gynecology;  Laterality: N/A;    Current Outpatient Medications  Medication Sig Dispense Refill   amLODipine (NORVASC) 5 MG tablet Take 5 mg by mouth in the morning.     ARTIFICIAL TEAR SOLUTION OP Place 1 drop into both eyes 3 (three) times daily as needed (dry/irritated eyes.).     benazepril (LOTENSIN) 40 MG tablet Take 40 mg by mouth in the morning.     Cholecalciferol 50 MCG (2000 UT) TABS Take 2,000 Units by mouth in the morning.     cyanocobalamin (,VITAMIN B-12,) 1000 MCG/ML injection Inject 1,000 mcg into the muscle every 30 (thirty) days.     cyclobenzaprine (FLEXERIL) 10 MG tablet Take 1 tablet (10 mg total) by mouth 2 (two) times daily as needed. 20 tablet 0   denosumab (PROLIA) 60 MG/ML SOSY injection Inject 60 mg into the skin every 6 (six) months.     fexofenadine (ALLEGRA) 60 MG tablet Take 60 mg by mouth daily as needed for allergies or rhinitis.     fluticasone (FLONASE) 50 MCG/ACT nasal spray Place 1-2 sprays into both nostrils daily as needed for allergies or rhinitis.     furosemide (LASIX) 20 MG tablet Take 20 mg by mouth every other day.     gabapentin (NEURONTIN) 300 MG capsule Take 1 capsule (300 mg total) by mouth 3 (three) times daily. 90 capsule 5   hydroxychloroquine (PLAQUENIL) 200  MG tablet Take 1 tablet (200 mg total) by mouth 2 (two) times daily. 180 tablet 0   ibuprofen (ADVIL) 600 MG tablet Take 1 tablet (600 mg total) by mouth every 8 (eight) hours as needed. 30 tablet 0  mupirocin ointment (BACTROBAN) 2 % Place 1 Application into the nose 2 (two) times daily. Apply to each nostril twice daily 22 g 0   oxyCODONE (OXY IR/ROXICODONE) 5 MG immediate release tablet 1 tablet as needed Orally every 4 hours as needed (Patient not taking: Reported on 12/09/2022)     rosuvastatin (CRESTOR) 10 MG tablet Take 10 mg by mouth in the morning.     traZODone (DESYREL) 50 MG tablet Take 50 mg by mouth at bedtime as needed for sleep.     No current facility-administered medications for this visit.    Allergies as of 03/22/2023   (No Known Allergies)    Social History   Socioeconomic History   Marital status: Single    Spouse name: Not on file   Number of children: Not on file   Years of education: Not on file   Highest education level: Not on file  Occupational History   Not on file  Tobacco Use   Smoking status: Never    Passive exposure: Never   Smokeless tobacco: Never  Vaping Use   Vaping status: Never Used  Substance and Sexual Activity   Alcohol use: Not Currently    Comment: occasional   Drug use: No   Sexual activity: Not Currently    Birth control/protection: Post-menopausal, Surgical    Comment: hyst  Other Topics Concern   Not on file  Social History Narrative   Not on file   Social Drivers of Health   Financial Resource Strain: Low Risk  (04/03/2021)   Overall Financial Resource Strain (CARDIA)    Difficulty of Paying Living Expenses: Not very hard  Food Insecurity: No Food Insecurity (11/01/2022)   Hunger Vital Sign    Worried About Running Out of Food in the Last Year: Never true    Ran Out of Food in the Last Year: Never true  Transportation Needs: No Transportation Needs (11/01/2022)   PRAPARE - Administrator, Civil Service  (Medical): No    Lack of Transportation (Non-Medical): No  Physical Activity: Insufficiently Active (04/03/2021)   Exercise Vital Sign    Days of Exercise per Week: 2 days    Minutes of Exercise per Session: 20 min  Stress: No Stress Concern Present (04/03/2021)   [employer] of Occupational Health - Occupational Stress Questionnaire    Feeling of Stress : Only a little  Social Connections: Moderately Integrated (04/03/2021)   Social Connection and Isolation Panel [NHANES]    Frequency of Communication with Friends and Family: More than three times a week    Frequency of Social Gatherings with Friends and Family: Once a week    Attends Religious Services: More than 4 times per year    Active Member of [employer] or Organizations: Yes    Attends Banker Meetings: 1 to 4 times per year    Marital Status: Divorced    Review of systems General: negative for malaise, night sweats, fever, chills, weight loss Neck: Negative for lumps, goiter, pain and significant neck swelling Resp: Negative for cough, wheezing, dyspnea at rest CV: Negative for chest pain, leg swelling, palpitations, orthopnea GI: denies melena, hematochezia, nausea, vomiting, diarrhea, dysphagia, odyonophagia, early satiety or unintentional weight loss. +abdominal discomfort +bloating +constipation  The remainder of the review of systems is noncontributory.  Physical Exam: BP 123/79 (BP Location: Left Arm, Patient Position: Sitting, Cuff Size: Large)   Pulse 76   Temp 98 F (36.7 C) (Temporal)   Ht 5\' 2"  (  1.575 m)   Wt 166 lb 11.2 oz (75.6 kg)   BMI 30.49 kg/m  General:   Alert and oriented. No distress noted. Tammy and cooperative.  Head:  Normocephalic and atraumatic. Eyes:  Conjuctiva clear without scleral icterus. Mouth:  Oral mucosa pink and moist. Good dentition. No lesions. Heart: Normal rate and rhythm, s1 and s2 heart sounds present.  Lungs: Clear lung sounds in all lobes. Respirations equal  and unlabored. Abdomen:  +BS, soft, non-tender and non-distended. No rebound or guarding. No HSM or masses noted. Neurologic:  Alert and  oriented x4 Psych:  Alert and cooperative. Normal mood and affect.  Invalid input(s): "6 MONTHS"   ASSESSMENT: Tammy Jensen is a 73 y.o. female presenting today for abdominal discomfort, bloating and constipation  Patient with history of constipation, notably was taking MiraLAX 1 capful a day and having a bowel movement maybe 1-2 times per week.  Recently less bowel movements.  Had significant episode of constipation last week where she took multiple things and finally had a small bowel movement after eating a salad.  She reports abdomen is full and feels bloated.  TSH checked in 2024 was in normal limits, recent colonoscopy as outlined above with polyps present but no other findings to drink because of constipation.  She is not on any opiate pain medicine or other medications that I think would be contributing to her constipation other than perhaps her gabapentin.  At this time would recommend completing a bowel prep to get her cleaned out and start Amitiza 24 mcg twice daily.  She should continue with good water intake, diet high in fruits, veggies, whole grains.  May consider SIMO testing if symptoms do not improve with the above interventions.    PLAN:  -complete bowel prep  - start amitiza BID after bowel prep -Increase water intake, aim for atleast 64 oz per day -Increase fruits, veggies and whole grains, kiwi and prunes are especially good for constipation  All questions were answered, patient verbalized understanding and is in agreement with plan as outlined above.    Follow Up: 2 months   Tammy Riga L. Jeanmarie Hubert, MSN, APRN, AGNP-C Adult-Gerontology Nurse Practitioner [facility]  I have reviewed the note and agree with the APP's assessment as described in this progress note  Katrinka Blazing, MD Gastroenterology  and Hepatology [facility] Gastroenterology

## 2023-03-27 ENCOUNTER — Other Ambulatory Visit: Payer: Self-pay | Admitting: Orthopedic Surgery

## 2023-03-27 DIAGNOSIS — M75111 Incomplete rotator cuff tear or rupture of right shoulder, not specified as traumatic: Secondary | ICD-10-CM

## 2023-03-27 DIAGNOSIS — G8929 Other chronic pain: Secondary | ICD-10-CM

## 2023-04-15 ENCOUNTER — Encounter: Payer: Self-pay | Admitting: Physician Assistant

## 2023-04-18 ENCOUNTER — Other Ambulatory Visit (HOSPITAL_COMMUNITY): Payer: Self-pay | Admitting: Family Medicine

## 2023-04-18 DIAGNOSIS — Z1231 Encounter for screening mammogram for malignant neoplasm of breast: Secondary | ICD-10-CM

## 2023-04-21 NOTE — Progress Notes (Signed)
 Office Visit Note  Patient: Tammy Jensen             Date of Birth: 07-18-1950           MRN: [id number]             PCP: Halbert Mariano SQUIBB, DO Referring: Halbert Mariano SQUIBB, DO Visit Date: 04/22/2023 Occupation: @GUAROCC @  Subjective:  Trochanteric bursitis of both hips  History of Present Illness: Tammy Jensen is a 73 y.o. female with history of seropositive rheumatoid arthritis and osteoporosis.  Patient remains on Plaquenil  200 mg 1 tablet by mouth twice daily.  She denies any signs or symptoms of a rheumatoid arthritis flare.  She has not been experiencing any morning stiffness, nocturnal pain, or difficulty with ADLs.  She denies any joint swelling.  Patient presents today with a recurrence of pain on the lateral aspect of both hips consistent with trochanteric bursitis.  She has requested bilateral trochanteric bursa cortisone injections today. Patient reports her right shoulder replacement is doing well.  She continues to perform home exercises on a daily basis.   Activities of Daily Living:  Patient reports morning stiffness for 0 minutes.   Patient Denies nocturnal pain.  Difficulty dressing/grooming: Denies Difficulty climbing stairs: Reports Difficulty getting out of chair: Denies Difficulty using hands for taps, buttons, cutlery, and/or writing: Denies  Review of Systems  Constitutional:  Negative for fatigue.  HENT:  Positive for nose dryness. Negative for mouth sores and mouth dryness.   Eyes:  Positive for dryness. Negative for pain.  Respiratory:  Negative for shortness of breath and difficulty breathing.   Cardiovascular:  Negative for chest pain and palpitations.  Gastrointestinal:  Positive for constipation. Negative for diarrhea.  Endocrine: Negative for increased urination.  Genitourinary:  Negative for involuntary urination.  Musculoskeletal:  Positive for joint pain, gait problem, joint pain and muscle tenderness. Negative for joint swelling,  myalgias, muscle weakness, morning stiffness and myalgias.  Skin:  Positive for sensitivity to sunlight. Negative for color change, rash and hair loss.  Allergic/Immunologic: Negative for susceptible to infections.  Neurological:  Positive for numbness. Negative for dizziness and headaches.  Hematological:  Negative for swollen glands.  Psychiatric/Behavioral:  Positive for sleep disturbance. Negative for depressed mood. The patient is not nervous/anxious.     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/2020   Rheumatoid arthritis with rheumatoid factor of multiple sites without organ or systems involvement (HCC) 09/06/2019   High risk medication use 09/06/2019   Elevated LFTs 09/06/2019   Age-related osteoporosis without current pathological fracture 09/06/2019   Dyslipidemia 08/14/2019   Abnormal electrocardiogram 03/12/2019   Chest pain 03/12/2019   HTN (hypertension) 03/12/2019   Obesity 03/12/2019   Family history of colon cancer 05/28/2016    Past Medical History:  Diagnosis Date   Dysrhythmia    High cholesterol    Hypertension    IBS (irritable bowel syndrome)    Rheumatoid arthritis (HCC)    Tachycardia     Family History  Problem Relation Age of Onset   Colon cancer Father    Colon cancer Sister    Heart disease Mother    Dementia Mother    Arthritis Mother    Hemachromatosis Son  Hemachromatosis Son    Past Surgical History:  Procedure Laterality Date   BIOPSY  04/14/2022   Procedure: BIOPSY;  Surgeon: Eartha Angelia Sieving, MD;  Location: [facility];  Service: Gastroenterology;;   COLONOSCOPY N/A 10/07/2016   Procedure: COLONOSCOPY;  Surgeon: Golda Claudis PENNER, MD;  Location:  [facility];  Service: Endoscopy;  Laterality: N/A;  730   COLONOSCOPY WITH PROPOFOL  N/A 04/14/2022   Procedure: COLONOSCOPY WITH PROPOFOL ;  Surgeon: Eartha Angelia Sieving, MD;  Location: [facility];  Service: Gastroenterology;  Laterality: N/A;  915am, asa 2   POLYPECTOMY  04/14/2022   Procedure: POLYPECTOMY;  Surgeon: Eartha Angelia Sieving, MD;  Location: [facility];  Service: Gastroenterology;;   RECONSTRUCTION OF EYELID  09/02/2021   REVERSE SHOULDER ARTHROPLASTY Right 11/01/2022   Procedure: RIGHT REVERSE SHOULDER ARTHROPLASTY;  Surgeon: Onesimo Oneil LABOR, MD;  Location: [facility];  Service: Orthopedics;  Laterality: Right;  RNFA NEEDED   TUBAL LIGATION     VAGINAL HYSTERECTOMY N/A 10/07/2021   Procedure: HYSTERECTOMY VAGINAL;  Surgeon: Jayne Vonn DEL, MD;  Location: [facility];  Service: Gynecology;  Laterality: N/A;   Social History   Social History Narrative   Not on file   Immunization History  Administered Date(s) Administered   PFIZER(Purple Top)SARS-COV-2 Vaccination 09/20/2019, 10/15/2019     Objective: Vital Signs: BP 124/80 (BP Location: Left Arm, Patient Position: Sitting, Cuff Size: Normal)   Pulse (!) 59   Resp 15   Ht 5' 2 (1.575 m)   Wt 165 lb 6.4 oz (75 kg)   BMI 30.25 kg/m    Physical Exam Vitals and nursing note reviewed.  Constitutional:      Appearance: She is well-developed.  HENT:     Head: Normocephalic and atraumatic.  Eyes:     Conjunctiva/sclera: Conjunctivae normal.  Cardiovascular:     Rate and Rhythm: Normal rate and regular rhythm.     Heart sounds: Normal heart sounds.  Pulmonary:     Effort: Pulmonary effort is normal.     Breath sounds: Normal breath sounds.  Abdominal:     General: Bowel sounds are normal.     Palpations: Abdomen is soft.  Musculoskeletal:     Cervical back: Normal range of motion.  Lymphadenopathy:     Cervical: No cervical adenopathy.  Skin:    General: Skin is warm and dry.     Capillary Refill:  Capillary refill takes less than 2 seconds.  Neurological:     Mental Status: She is alert and oriented to person, place, and time.  Psychiatric:        Behavior: Behavior normal.      Musculoskeletal Exam: C-spine has good ROM with no discomfort.  Limited mobility of the lumbar spine.  Right shoulder replacement has limited internal rotation.  No tenderness or synovitis of MCPs joints.  Complete fist formation bilaterally.  Hip joints have good ROM with no groin pain. Tenderness over bilateral trochanteric bursa.  Knee joints have good ROM with no warmth or effusion.  Ankle joints have good ROM with no tenderness or joint swelling.    CDAI Exam: CDAI Score: -- Patient Global: --; Provider Global: -- Swollen: --; Tender: -- Joint Exam 04/22/2023   No joint exam has been documented for this visit   There is currently no information documented on the homunculus. Go to the Rheumatology activity and complete the homunculus joint exam.  Investigation: No additional findings.  Imaging: No results found.  Recent Labs: Lab Results  Component Value Date   WBC 5.4 12/09/2022   HGB 12.6 12/09/2022   PLT 235 12/09/2022   NA 141 12/09/2022   K 4.3 12/09/2022   CL 108 12/09/2022   CO2 23 12/09/2022   GLUCOSE 97 12/09/2022   BUN 14 12/09/2022   CREATININE 0.68 12/09/2022   BILITOT 0.7 12/09/2022   ALKPHOS 57 02/28/2022   AST 28 12/09/2022   ALT 16 12/09/2022   PROT 6.8 12/09/2022   ALBUMIN 4.5 02/28/2022   CALCIUM  9.4 12/09/2022   GFRAA 89 04/25/2020    Speciality Comments: PLQ Eye Exam: 02/25/2023 WNL  [facility] Follow up in 1 year.  Procedures:  Large Joint Inj: bilateral greater trochanter on 04/22/2023 9:51 AM Indications: pain Details: 27 G 1.5 in needle, lateral approach  Arthrogram: No  Medications (Right): 1.5 mL lidocaine  1 %; 40 mg triamcinolone  acetonide 40 MG/ML Aspirate (Right): 0 mL Medications (Left): 1.5 mL lidocaine  1 %; 40 mg triamcinolone   acetonide 40 MG/ML Aspirate (Left): 0 mL Outcome: tolerated well, no immediate complications Procedure, treatment alternatives, risks and benefits explained, specific risks discussed. Consent was given by the patient. Immediately prior to procedure a time out was called to verify the correct patient, procedure, equipment, support staff and site/side marked as required. Patient was prepped and draped in the usual sterile fashion.     Allergies: Patient has no known allergies.      Assessment / Plan:     Visit Diagnoses: Rheumatoid arthritis with rheumatoid factor of multiple sites without organ or systems involvement [facility]): She has no joint tenderness or synovitis on examination today.  She has not had any signs or symptoms of a rheumatoid arthritis flare.  She has clinically been doing well taking Plaquenil  200 mg 1 tablet by mouth twice daily.  She is tolerating Plaquenil  without any side effects and has not had any gaps in therapy. She is not experiencing any morning stiffness, nocturnal pain, or difficulty with ADLs.  Patient will remain on Plaquenil  as monotherapy.  She was advised to notify us  if she develops signs or symptoms of a flare.  She will follow-up in the office in 5 months or sooner if needed.  High risk medication use - Plaquenil  200 mg 1 tablet by mouth twice daily started on August 14, 2019.  PLQ Eye Exam: 02/25/2023 WNL [facility] Follow up in 1 year.  CBC and CMP updated on 12/09/22.  Orders for CBC and CMP were released today. - Plan: CBC with Differential/Platelet, Comprehensive metabolic panel with GFR  Status post reverse total replacement of right shoulder - Performed by Dr. Onesimo on 11/01/22.  Doing well.  Limited internal rotation.  She has been completing home exercises daily.  Chronic left shoulder pain: No discomfort in the left shoulder at this time.  Trochanteric bursitis of both hips -Patient presents today with recurrence of pain on the lateral  aspect of both hips consistent with trochanteric bursitis.  Patient had a right trochanteric bursa cortisone injection performed on 12/09/2022.  She has requested bilateral trochanteric bursa cortisone injections today.  She tolerated the procedures well.  Procedure notes were completed above.  Aftercare was discussed.  She was advised to notify us  if her symptoms persist or worsen.  Plan: Large Joint Inj: bilateral greater trochanter  Lumbar facet arthropathy: Patient plans on following up with orthopedics for further evaluation of the discomfort she experiences in her lower back when standing or walking for long distances.  Pes cavus:  She is wearing proper fitting shoes.  Age-related osteoporosis without current pathological fracture: DEXA updated on 01/08/22: BMD as determined from [facility] L1-L2 is 0.777 g/cm2 with a T-Score of -3.2. Previous DEXA 03/19/2019 BMD as determined from [facility] L1-L2 is 0.763 g/cm2 with a T-Scoreof -3.3.  Fosamax May 2021-advised to discontinue fosamax by her PCP.  She took Fosamax for a total of 3 years. Patient received first Prolia  injection on 07/05/2022.  Second Prolia  injection on 01/06/2023. Due to update DEXA in January 2026.  Vitamin D  deficiency  Other medical conditions are listed as follows:   Essential hypertension: BP was 124/80 today in the office.    Dyslipidemia  History of IBS  Seasonal allergies  Orders: Orders Placed This Encounter  Procedures   Large Joint Inj: bilateral greater trochanter   CBC with Differential/Platelet   Comprehensive metabolic panel with GFR   No orders of the defined types were placed in this encounter.    Follow-Up Instructions: Return in about 5 months (around 09/22/2023) for Rheumatoid arthritis, Osteoporosis.   Waddell CHRISTELLA Craze, PA-C  Note - This record has been created using Dragon software.  Chart creation errors have been sought, but may not always  have been located. Such creation errors do not reflect on   the standard of medical care.

## 2023-04-22 ENCOUNTER — Encounter: Payer: Self-pay | Admitting: Physician Assistant

## 2023-04-22 ENCOUNTER — Ambulatory Visit: Attending: Orthopedic Surgery | Admitting: Physician Assistant

## 2023-04-22 VITALS — BP 124/80 | HR 59 | Resp 15 | Ht 62.0 in | Wt 165.4 lb

## 2023-04-22 DIAGNOSIS — Z79899 Other long term (current) drug therapy: Secondary | ICD-10-CM

## 2023-04-22 DIAGNOSIS — G8929 Other chronic pain: Secondary | ICD-10-CM

## 2023-04-22 DIAGNOSIS — Q667 Congenital pes cavus, unspecified foot: Secondary | ICD-10-CM

## 2023-04-22 DIAGNOSIS — M47816 Spondylosis without myelopathy or radiculopathy, lumbar region: Secondary | ICD-10-CM

## 2023-04-22 DIAGNOSIS — Z8719 Personal history of other diseases of the digestive system: Secondary | ICD-10-CM

## 2023-04-22 DIAGNOSIS — M7062 Trochanteric bursitis, left hip: Secondary | ICD-10-CM | POA: Insufficient documentation

## 2023-04-22 DIAGNOSIS — M0579 Rheumatoid arthritis with rheumatoid factor of multiple sites without organ or systems involvement: Secondary | ICD-10-CM

## 2023-04-22 DIAGNOSIS — M7061 Trochanteric bursitis, right hip: Secondary | ICD-10-CM | POA: Diagnosis not present

## 2023-04-22 DIAGNOSIS — M25512 Pain in left shoulder: Secondary | ICD-10-CM | POA: Insufficient documentation

## 2023-04-22 DIAGNOSIS — Z96611 Presence of right artificial shoulder joint: Secondary | ICD-10-CM

## 2023-04-22 DIAGNOSIS — I10 Essential (primary) hypertension: Secondary | ICD-10-CM

## 2023-04-22 DIAGNOSIS — E785 Hyperlipidemia, unspecified: Secondary | ICD-10-CM

## 2023-04-22 DIAGNOSIS — J302 Other seasonal allergic rhinitis: Secondary | ICD-10-CM

## 2023-04-22 DIAGNOSIS — E559 Vitamin D deficiency, unspecified: Secondary | ICD-10-CM

## 2023-04-22 DIAGNOSIS — M810 Age-related osteoporosis without current pathological fracture: Secondary | ICD-10-CM

## 2023-04-22 MED ORDER — TRIAMCINOLONE ACETONIDE 40 MG/ML IJ SUSP
40.0000 mg | INTRAMUSCULAR | Status: AC | PRN
  Administered 2023-04-22: 40 mg via INTRA_ARTICULAR

## 2023-04-22 MED ORDER — LIDOCAINE HCL 1 % IJ SOLN
1.5000 mL | INTRAMUSCULAR | Status: AC | PRN
  Administered 2023-04-22: 1.5 mL

## 2023-04-23 LAB — COMPREHENSIVE METABOLIC PANEL WITH GFR
AG Ratio: 1.6 (calc) (ref 1.0–2.5)
ALT: 15 U/L (ref 6–29)
AST: 21 U/L (ref 10–35)
Albumin: 4.3 g/dL (ref 3.6–5.1)
Alkaline phosphatase (APISO): 48 U/L (ref 37–153)
BUN: 17 mg/dL (ref 7–25)
CO2: 26 mmol/L (ref 20–32)
Calcium: 9.4 mg/dL (ref 8.6–10.4)
Chloride: 107 mmol/L (ref 98–110)
Creat: 0.91 mg/dL (ref 0.60–1.00)
Globulin: 2.7 g/dL (ref 1.9–3.7)
Glucose, Bld: 91 mg/dL (ref 65–99)
Potassium: 4.1 mmol/L (ref 3.5–5.3)
Sodium: 140 mmol/L (ref 135–146)
Total Bilirubin: 0.8 mg/dL (ref 0.2–1.2)
Total Protein: 7 g/dL (ref 6.1–8.1)
eGFR: 67 mL/min/{1.73_m2} (ref >=60)

## 2023-04-23 LAB — CBC WITH DIFFERENTIAL/PLATELET
Absolute Lymphocytes: 1602 {cells}/uL (ref 850–3900)
Absolute Monocytes: 428 {cells}/uL (ref 200–950)
Basophils Absolute: 80 {cells}/uL (ref 0–200)
Basophils Relative: 1.4 %
Eosinophils Absolute: 120 {cells}/uL (ref 15–500)
Eosinophils Relative: 2.1 %
HCT: 39.5 % (ref 35.0–45.0)
Hemoglobin: 12.4 g/dL (ref 11.7–15.5)
MCH: 27 pg (ref 27.0–33.0)
MCHC: 31.4 g/dL — ABNORMAL LOW (ref 32.0–36.0)
MCV: 85.9 fL (ref 80.0–100.0)
MPV: 10.7 fL (ref 7.5–12.5)
Monocytes Relative: 7.5 %
Neutro Abs: 3471 {cells}/uL (ref 1500–7800)
Neutrophils Relative %: 60.9 %
Platelets: 248 10*3/uL (ref 140–400)
RBC: 4.6 10*6/uL (ref 3.80–5.10)
RDW: 13.7 % (ref 11.0–15.0)
Total Lymphocyte: 28.1 %
WBC: 5.7 10*3/uL (ref 3.8–10.8)

## 2023-04-24 NOTE — Progress Notes (Signed)
 CBC and CMP WNL

## 2023-04-27 ENCOUNTER — Ambulatory Visit (HOSPITAL_COMMUNITY)
Admission: RE | Admit: 2023-04-27 | Discharge: 2023-04-27 | Disposition: A | Discharge status: Home / Self Care | Source: Ambulatory Visit | Attending: Family Medicine | Admitting: Family Medicine

## 2023-04-27 ENCOUNTER — Encounter (HOSPITAL_COMMUNITY): Payer: Self-pay

## 2023-04-27 DIAGNOSIS — Z1231 Encounter for screening mammogram for malignant neoplasm of breast: Secondary | ICD-10-CM | POA: Insufficient documentation

## 2023-04-29 ENCOUNTER — Encounter: Payer: Self-pay | Admitting: Physician Assistant

## 2023-04-29 ENCOUNTER — Ambulatory Visit: Payer: 59 | Admitting: Orthopedic Surgery

## 2023-05-06 ENCOUNTER — Encounter: Payer: Self-pay | Admitting: Orthopedic Surgery

## 2023-05-06 ENCOUNTER — Other Ambulatory Visit (INDEPENDENT_AMBULATORY_CARE_PROVIDER_SITE_OTHER)

## 2023-05-06 ENCOUNTER — Ambulatory Visit (INDEPENDENT_AMBULATORY_CARE_PROVIDER_SITE_OTHER): Admitting: Orthopedic Surgery

## 2023-05-06 VITALS — BP 120/83 | Temp 78.0°F

## 2023-05-06 DIAGNOSIS — M25511 Pain in right shoulder: Secondary | ICD-10-CM | POA: Diagnosis not present

## 2023-05-06 DIAGNOSIS — G8929 Other chronic pain: Secondary | ICD-10-CM | POA: Diagnosis not present

## 2023-05-06 DIAGNOSIS — M545 Low back pain, unspecified: Secondary | ICD-10-CM

## 2023-05-06 DIAGNOSIS — Z96611 Presence of right artificial shoulder joint: Secondary | ICD-10-CM | POA: Diagnosis not present

## 2023-05-06 NOTE — Progress Notes (Signed)
 Orthopaedic Postop Note  Assessment: Tammy Jensen is a 73 y.o. female s/p Right Reverse Shoulder Arthroplasty  DOS: 11/01/2022  Plan: Tammy Jensen right shoulder is doing very well following surgery.  She is approximately 6 months out.  She continues to do exercises on her own.  She has good range of motion.  No pain in her shoulder.  Radiographs remain stable.  Reminded her that she can see improvements for up to a year following surgery.  No limitations on her activities at this time.  Activities as tolerated.  I would like to see her back in 6 months.  She is also having pain in the lower back, with radiating symptoms into both legs.  She has previously done therapy, which was very helpful.  She is interested in attempting therapy again.  We will place referral.   Follow-up: Return in about 6 months (around 11/06/2023).  XR at next visit: Right shoulder  Subjective:  Chief Complaint  Patient presents with   Pre-op Exam   Follow-up    Post op right shoulder    History of Present Illness: Tammy Jensen is a 73 y.o. female who returns to clinic for repeat evaluation of her right shoulder.  Surgery was approximately 6 months ago.  She is doing very well following surgery.  She continues to do exercises on her own.  She is no longer working with physical therapy.  No numbness or tingling.  She states that she is having more issues with her lower back than her right shoulder.  She has had issues like this in the past.  It has responded well to therapy.   Review of Systems: No fevers or chills No numbness or tingling No Chest Pain No shortness of breath   Objective: BP 120/83   Temp (!) 78 F (25.6 C)   Physical Exam:  Alert and oriented, no acute distress  Surgical incision is healed.  No surrounding erythema or drainage.  Sensation intact in the axillary nerve distribution.  160 degrees of forward flexion.  100 degrees of abduction.  Internal rotation to T12.   Fingers warm and well-perfused.  Mild tenderness to palpation across the lower back.  Negative straight leg raise bilaterally  IMAGING: I personally ordered and reviewed the following images:  X-rays of the right shoulder were obtained in clinic today.  These are compared to available x-rays.  Reverse shoulder arthroplasty in stable alignment.  There is no lucency.  No fractures.  No dislocation.  No signs of loosening.  Impression: Right reverse shoulder arthroplasty in stable alignment, without subsidence   Tonita Frater, MD 05/06/2023 9:11 AM

## 2023-05-23 ENCOUNTER — Other Ambulatory Visit: Payer: Self-pay | Admitting: Orthopedic Surgery

## 2023-05-23 DIAGNOSIS — G8929 Other chronic pain: Secondary | ICD-10-CM

## 2023-05-23 DIAGNOSIS — M75111 Incomplete rotator cuff tear or rupture of right shoulder, not specified as traumatic: Secondary | ICD-10-CM

## 2023-05-24 ENCOUNTER — Other Ambulatory Visit (INDEPENDENT_AMBULATORY_CARE_PROVIDER_SITE_OTHER): Payer: Self-pay | Admitting: Gastroenterology

## 2023-05-26 ENCOUNTER — Ambulatory Visit: Payer: 59 | Admitting: Rheumatology

## 2023-06-06 ENCOUNTER — Ambulatory Visit (INDEPENDENT_AMBULATORY_CARE_PROVIDER_SITE_OTHER): Admitting: Gastroenterology

## 2023-06-07 ENCOUNTER — Other Ambulatory Visit: Payer: Self-pay | Admitting: Pharmacist

## 2023-06-07 NOTE — Progress Notes (Signed)
 Next Prolia  SQ due on 07/05/2023. Diagnosis: age-related osteoporosis  Dose: 60 mg SQ every 6 months  Last Clinic Visit: 04/22/2023 Next Clinic Visit: 09/23/2023  Last Prolia  dose: 01/06/2023  Labs: 04/22/2023 Last DEXA: 01/08/2022 Next DEXA due: January 2026  Orders placed for Prolia  x 1 dose at [facility]. No premedicatons required.   Will follow-up to ensured scheduled and completed  Shareeka Yim, PharmD, MPH, BCPS, CPP Clinical Pharmacist (Rheumatology and Pulmonology)

## 2023-06-21 ENCOUNTER — Emergency Department (HOSPITAL_COMMUNITY)

## 2023-06-21 ENCOUNTER — Other Ambulatory Visit: Payer: Self-pay

## 2023-06-21 ENCOUNTER — Emergency Department (HOSPITAL_COMMUNITY)
Admission: EM | Admit: 2023-06-21 | Discharge: 2023-06-21 | Disposition: A | Discharge status: Home / Self Care | Attending: Student | Admitting: Student

## 2023-06-21 ENCOUNTER — Encounter (HOSPITAL_COMMUNITY): Payer: Self-pay | Admitting: Emergency Medicine

## 2023-06-21 DIAGNOSIS — R197 Diarrhea, unspecified: Secondary | ICD-10-CM | POA: Diagnosis not present

## 2023-06-21 DIAGNOSIS — R112 Nausea with vomiting, unspecified: Secondary | ICD-10-CM | POA: Insufficient documentation

## 2023-06-21 DIAGNOSIS — Z79899 Other long term (current) drug therapy: Secondary | ICD-10-CM | POA: Insufficient documentation

## 2023-06-21 DIAGNOSIS — R1013 Epigastric pain: Secondary | ICD-10-CM | POA: Insufficient documentation

## 2023-06-21 DIAGNOSIS — I10 Essential (primary) hypertension: Secondary | ICD-10-CM | POA: Insufficient documentation

## 2023-06-21 DIAGNOSIS — R1011 Right upper quadrant pain: Secondary | ICD-10-CM | POA: Diagnosis not present

## 2023-06-21 LAB — CBC
HCT: 42.4 % (ref 36.0–46.0)
Hemoglobin: 14 g/dL (ref 12.0–15.0)
MCH: 27.9 pg (ref 26.0–34.0)
MCHC: 33 g/dL (ref 30.0–36.0)
MCV: 84.6 fL (ref 80.0–100.0)
Platelets: 270 10*3/uL (ref 150–400)
RBC: 5.01 MIL/uL (ref 3.87–5.11)
RDW: 14.8 % (ref 11.5–15.5)
WBC: 8.2 10*3/uL (ref 4.0–10.5)
nRBC: 0 % (ref 0.0–0.2)

## 2023-06-21 LAB — URINALYSIS, ROUTINE W REFLEX MICROSCOPIC
Bacteria, UA: NONE SEEN
Bilirubin Urine: NEGATIVE
Glucose, UA: NEGATIVE mg/dL
Hgb urine dipstick: NEGATIVE
Ketones, ur: 20 mg/dL — AB
Nitrite: NEGATIVE
Protein, ur: 100 mg/dL — AB
Specific Gravity, Urine: 1.018 (ref 1.005–1.030)
pH: 6 (ref 5.0–8.0)

## 2023-06-21 LAB — COMPREHENSIVE METABOLIC PANEL WITH GFR
ALT: 24 U/L (ref 0–44)
AST: 29 U/L (ref 15–41)
Albumin: 4.2 g/dL (ref 3.5–5.0)
Alkaline Phosphatase: 62 U/L (ref 38–126)
Anion gap: 12 (ref 5–15)
BUN: 15 mg/dL (ref 8–23)
CO2: 21 mmol/L — ABNORMAL LOW (ref 22–32)
Calcium: 9.1 mg/dL (ref 8.9–10.3)
Chloride: 103 mmol/L (ref 98–111)
Creatinine, Ser: 0.73 mg/dL (ref 0.44–1.00)
GFR, Estimated: >60 mL/min (ref >60)
Glucose, Bld: 93 mg/dL (ref 70–99)
Potassium: 4 mmol/L (ref 3.5–5.1)
Sodium: 136 mmol/L (ref 135–145)
Total Bilirubin: 1.4 mg/dL — ABNORMAL HIGH (ref 0.0–1.2)
Total Protein: 7.5 g/dL (ref 6.5–8.1)

## 2023-06-21 LAB — LIPASE, BLOOD: Lipase: 31 U/L (ref 11–51)

## 2023-06-21 MED ORDER — ONDANSETRON 4 MG PO TBDP: 4.0000 mg | ORAL_TABLET | Freq: Three times a day (TID) | ORAL | 0 refills | Status: AC | PRN

## 2023-06-21 MED ORDER — IOHEXOL 300 MG/ML  SOLN
100.0000 mL | Freq: Once | INTRAMUSCULAR | Status: AC | PRN
  Administered 2023-06-21: 100 mL via INTRAVENOUS

## 2023-06-21 MED ORDER — ONDANSETRON HCL 4 MG/2ML IJ SOLN
4.0000 mg | Freq: Once | INTRAMUSCULAR | Status: AC
  Administered 2023-06-21: 4 mg via INTRAVENOUS
  Filled 2023-06-21: qty 2

## 2023-06-21 MED ORDER — FAMOTIDINE 20 MG PO TABS: 20.0000 mg | ORAL_TABLET | Freq: Two times a day (BID) | ORAL | 0 refills | Status: AC

## 2023-06-21 MED ORDER — LACTATED RINGERS IV BOLUS
1000.0000 mL | Freq: Once | INTRAVENOUS | Status: AC
  Administered 2023-06-21: 1000 mL via INTRAVENOUS

## 2023-06-21 NOTE — ED Notes (Signed)
 Patient transported to CT

## 2023-06-21 NOTE — ED Provider Notes (Signed)
  EMERGENCY DEPARTMENT AT [facility] Provider Note  CSN: [id number] Arrival date & time: 06/21/23 8295  Chief Complaint(s) Abdominal Pain  HPI Tammy Jensen is a 73 y.o. female with PMH IBS, RA who presents emergency room for evaluation of abdominal pain nausea and vomiting.  States that symptoms began abruptly over the last 24 hours with associated nausea, vomiting and diarrhea.  Pain worse in the epigastrium and right upper quadrant.  Denies associated chest pain, shortness of breath, headache, fever or other systemic symptoms.   Past Medical History Past Medical History:  Diagnosis Date   Dysrhythmia    High cholesterol    Hypertension    IBS (irritable bowel syndrome)    Rheumatoid arthritis (HCC)    Tachycardia    Patient Active Problem List   Diagnosis Date Noted   Rotator cuff arthropathy of right shoulder 11/01/2022   Constipation 03/11/2022   Liver hemangioma 03/11/2022   Mixed hyperlipidemia 01/29/2021   Rheumatoid arteritis (HCC) 06/26/2020   Pernicious anemia 06/26/2020   Mild valvular heart disease 06/26/2020   Rheumatoid arthritis with rheumatoid factor of multiple sites without organ or systems involvement (HCC) 09/06/2019   High risk medication use 09/06/2019   Elevated LFTs 09/06/2019   Age-related osteoporosis without current pathological fracture 09/06/2019   Dyslipidemia 08/14/2019   Abnormal electrocardiogram 03/12/2019   Chest pain 03/12/2019   HTN (hypertension) 03/12/2019   Obesity 03/12/2019   Family history of colon cancer 05/28/2016   Home Medication(s) Prior to Admission medications   Medication Sig Start Date End Date Taking? Authorizing Provider  famotidine (PEPCID) 20 MG tablet Take 1 tablet (20 mg total) by mouth 2 (two) times daily. 06/21/23  Yes Zhavia Cunanan, MD  ondansetron  (ZOFRAN -ODT) 4 MG disintegrating tablet Take 1 tablet (4 mg total) by mouth every 8 (eight) hours as needed for nausea or vomiting. 06/21/23   Yes Marlana Mckowen, MD  amLODipine  (NORVASC ) 5 MG tablet Take 5 mg by mouth in the morning. 03/08/19   [provider]  ARTIFICIAL TEAR SOLUTION OP Place 1 drop into both eyes 3 (three) times daily as needed (dry/irritated eyes.).    [provider]  benazepril  (LOTENSIN ) 40 MG tablet Take 40 mg by mouth in the morning. 03/14/06   [provider]  Cholecalciferol 50 MCG (2000 UT) TABS Take 2,000 Units by mouth in the morning.    [provider]  cyanocobalamin (,VITAMIN B-12,) 1000 MCG/ML injection Inject 1,000 mcg into the muscle every 30 (thirty) days.    [provider]  denosumab  (PROLIA ) 60 MG/ML SOSY injection Inject 60 mg into the skin every 6 (six) months.    [provider]  fexofenadine (ALLEGRA) 60 MG tablet Take 60 mg by mouth daily as needed for allergies or rhinitis.    [provider]  fluticasone  (FLONASE ) 50 MCG/ACT nasal spray Place 1-2 sprays into both nostrils daily as needed for allergies or rhinitis.    [provider]  furosemide (LASIX) 20 MG tablet Take 20 mg by mouth every other day.    [provider]  gabapentin  (NEURONTIN ) 300 MG capsule TAKE 1 CAPSULE BY MOUTH THREE TIMES DAILY 05/25/23   Darrin Emerald, MD  hydroxychloroquine  (PLAQUENIL ) 200 MG tablet Take 1 tablet (200 mg total) by mouth 2 (two) times daily. 12/09/22   Romayne Clubs, PA-C  ibuprofen  (ADVIL ) 600 MG tablet Take 1 tablet (600 mg total) by mouth every 8 (eight) hours as needed. 12/15/22   Sharol Decamp  A, MD  lubiprostone  (AMITIZA ) 24 MCG capsule TAKE 1 CAPSULE BY MOUTH TWICE  DAILY WITH MEAL. 05/24/23   Carlan, Fidel Huddle, NP  rosuvastatin  (CRESTOR ) 10 MG tablet Take 10 mg by mouth in the morning. 05/28/19   [provider]  traZODone  (DESYREL ) 50 MG tablet Take 50 mg by mouth at bedtime as needed for sleep.    [provider]                                                                                                                                     Past Surgical History Past Surgical History:  Procedure Laterality Date   BIOPSY  04/14/2022   Procedure: BIOPSY;  Surgeon: Urban Garden, MD;  Location: [facility];  Service: Gastroenterology;;   COLONOSCOPY N/A 10/07/2016   Procedure: COLONOSCOPY;  Surgeon: Ruby Corporal, MD;  Location: [facility];  Service: Endoscopy;  Laterality: N/A;  730   COLONOSCOPY WITH PROPOFOL  N/A 04/14/2022   Procedure: COLONOSCOPY WITH PROPOFOL ;  Surgeon: Urban Garden, MD;  Location: [facility];  Service: Gastroenterology;  Laterality: N/A;  915am, asa 2   POLYPECTOMY  04/14/2022   Procedure: POLYPECTOMY;  Surgeon: Urban Garden, MD;  Location: [facility];  Service: Gastroenterology;;   RECONSTRUCTION OF EYELID  09/02/2021   REVERSE SHOULDER ARTHROPLASTY Right 11/01/2022   Procedure: RIGHT REVERSE SHOULDER ARTHROPLASTY;  Surgeon: Tonita Frater, MD;  Location: [facility];  Service: Orthopedics;  Laterality: Right;  RNFA NEEDED   TUBAL LIGATION     VAGINAL HYSTERECTOMY N/A 10/07/2021   Procedure: HYSTERECTOMY VAGINAL;  Surgeon: Wendelyn Halter, MD;  Location: [facility];  Service: Gynecology;  Laterality: N/A;   Family History Family History  Problem Relation Age of Onset   Colon cancer Father    Colon cancer Sister    Heart disease Mother    Dementia Mother    Arthritis Mother    Hemachromatosis Son    Hemachromatosis Son     Social History Social History   Tobacco Use   Smoking status: Never    Passive exposure: Never   Smokeless tobacco: Never  Vaping Use   Vaping status: Never Used  Substance Use Topics   Alcohol use: Not Currently   Drug use: No   Allergies Patient has no known allergies.  Review of Systems Review of Systems  Gastrointestinal:  Positive for abdominal pain, diarrhea, nausea and vomiting.    Physical Exam Vital Signs  I have reviewed the triage vital signs BP (!) 135/97 (BP  Location: Left Arm)   Pulse 90   Temp 98.1 F (36.7 C) (Oral)   Resp 20   Ht 5' 2 (1.575 m)   Wt 71.2 kg   SpO2 96%   BMI 28.72 kg/m   Physical Exam Vitals and nursing note reviewed.  Constitutional:      General: She is not in acute  distress.    Appearance: She is well-developed.  HENT:     Head: Normocephalic and atraumatic.   Eyes:     Conjunctiva/sclera: Conjunctivae normal.    Cardiovascular:     Rate and Rhythm: Normal rate and regular rhythm.     Heart sounds: No murmur heard. Pulmonary:     Effort: Pulmonary effort is normal. No respiratory distress.     Breath sounds: Normal breath sounds.  Abdominal:     Palpations: Abdomen is soft.     Tenderness: There is abdominal tenderness in the right upper quadrant and epigastric area.   Musculoskeletal:        General: No swelling.     Cervical back: Neck supple.   Skin:    General: Skin is warm and dry.     Capillary Refill: Capillary refill takes less than 2 seconds.   Neurological:     Mental Status: She is alert.   Psychiatric:        Mood and Affect: Mood normal.     ED Results and Treatments Labs (all labs ordered are listed, but only abnormal results are displayed) Labs Reviewed  COMPREHENSIVE METABOLIC PANEL WITH GFR - Abnormal; Notable for the following components:      Result Value   CO2 21 (*)    Total Bilirubin 1.4 (*)    All other components within normal limits  URINALYSIS, ROUTINE W REFLEX MICROSCOPIC - Abnormal; Notable for the following components:   APPearance HAZY (*)    Ketones, ur 20 (*)    Protein, ur 100 (*)    Leukocytes,Ua TRACE (*)    Non Squamous Epithelial 0-5 (*)    All other components within normal limits  LIPASE, BLOOD  CBC                                                                                                                          Radiology CT ABDOMEN PELVIS W CONTRAST Result Date: 06/21/2023 CLINICAL DATA:  Epigastric pain. EXAM: CT ABDOMEN AND  PELVIS WITH CONTRAST TECHNIQUE: Multidetector CT imaging of the abdomen and pelvis was performed using the standard protocol following bolus administration of intravenous contrast. RADIATION DOSE REDUCTION: This exam was performed according to the departmental dose-optimization program which includes automated exposure control, adjustment of the mA and/or kV according to patient size and/or use of iterative reconstruction technique. CONTRAST:  OMNIPAQUE  IOHEXOL  300 MG/ML  SOLN COMPARISON:  02/28/2022. FINDINGS: Lower chest: No acute abnormality. No pleural or pericardial effusions. Small hiatal hernia. Hepatobiliary: Cm sized cyst in the right lobe. Hypodense lesion in the right hepatic dome measuring 1.9 cm unchanged compared to the prior study. No biliary ductal dilatation. Unremarkable gallbladder. Pancreas: There is a calcification that may be related to chronic pancreatitis. No pancreatic ductal dilatation or surrounding inflammatory changes. Spleen: Normal in size without focal abnormality. Adrenals/Urinary Tract: Adrenal glands are unremarkable. Kidneys are normal, without renal calculi, focal lesion, or hydronephrosis. Bladder is unremarkable. Stomach/Bowel: Stomach  is within normal limits. Appendix not seen and no evidence of appendicitis. No evidence of bowel wall thickening, distention, or inflammatory changes. Vascular/Lymphatic: Aortic atherosclerosis. No enlarged abdominal or pelvic lymph nodes. Reproductive: Status post hysterectomy. No adnexal masses. Other: No abdominal wall hernia or abnormality. No abdominopelvic ascites. Musculoskeletal: No acute or significant osseous findings. IMPRESSION: 1. Small hiatal hernia. 2. Hepatic cysts and stable hypodense lesion in the right hepatic dome. 3. Findings suggestive of chronic pancreatitis. 4. No acute abdominal or pelvic pathology identified. 5. Aortic atherosclerosis (ICD10-I70.0). Electronically Signed   By: Sydell Eva M.D.   On: 06/21/2023  12:31    Pertinent labs & imaging results that were available during my care of the patient were reviewed by me and considered in my medical decision making (see MDM for details).  Medications Ordered in ED Medications  lactated ringers  bolus 1,000 mL (0 mLs Intravenous Stopped 06/21/23 1115)  ondansetron  (ZOFRAN ) injection 4 mg (4 mg Intravenous Given 06/21/23 1014)  iohexol  (OMNIPAQUE ) 300 MG/ML solution 100 mL (100 mLs Intravenous Contrast Given 06/21/23 1041)                                                                                                                                     Procedures Procedures  (including critical care time)  Medical Decision Making / ED Course   This patient presents to the ED for concern of abdominal pain, this involves an extensive number of treatment options, and is a complaint that carries with it a high risk of complications and morbidity.  The differential diagnosis includes GERD/gastritis, peptic ulcer disease, pancreatitis, gastroparesis, pneumonia, pleurisy, pericarditis  MDM: Patient seen emergency room for evaluation of abdominal pain.  Physical exam with some mild epigastric and right upper quadrant tenderness but is otherwise unremarkable.  Laboratory evaluation is unremarkable.  CT abdomen pelvis with evidence of possible chronic pancreatitis, hepatic cysts, small hiatal hernia but is otherwise unremarkable.  Patient given fluids and nausea medications in the reevaluation her symptoms have resolved.  She is able to tolerate p.o. without difficulty.  Currently she does not meet inpatient criteria for admission will be discharged with outpatient follow-up.  Return precautions given which she voiced understanding.   Additional history obtained: [facility] btained and reviewed including: Chart review including previous notes, labs, imaging, consultation notes   Lab Tests: -I ordered, reviewed, and interpreted labs.   The pertinent results  include:   Labs Reviewed  COMPREHENSIVE METABOLIC PANEL WITH GFR - Abnormal; Notable for the following components:      Result Value   CO2 21 (*)    Total Bilirubin 1.4 (*)    All other components within normal limits  URINALYSIS, ROUTINE W REFLEX MICROSCOPIC - Abnormal; Notable for the following components:   APPearance HAZY (*)    Ketones, ur 20 (*)    Protein, ur 100 (*)    Leukocytes,Ua TRACE (*)    Non  Squamous Epithelial 0-5 (*)    All other components within normal limits  LIPASE, BLOOD  CBC        Imaging Studies ordered: I ordered imaging studies including CTAP I independently visualized and interpreted imaging. I agree with the radiologist interpretation   Medicines ordered and prescription drug management: Meds ordered this encounter  Medications   lactated ringers  bolus 1,000 mL   ondansetron  (ZOFRAN ) injection 4 mg   iohexol  (OMNIPAQUE ) 300 MG/ML solution 100 mL   ondansetron  (ZOFRAN -ODT) 4 MG disintegrating tablet    Sig: Take 1 tablet (4 mg total) by mouth every 8 (eight) hours as needed for nausea or vomiting.    Dispense:  20 tablet    Refill:  0   famotidine (PEPCID) 20 MG tablet    Sig: Take 1 tablet (20 mg total) by mouth 2 (two) times daily.    Dispense:  30 tablet    Refill:  0    -I have reviewed the patients home medicines and have made adjustments as needed  Critical interventions none   Cardiac Monitoring: The patient was maintained on a cardiac monitor.  I personally viewed and interpreted the cardiac monitored which showed an underlying rhythm of: NSR  Social Determinants of Health:  Factors impacting patients care include: none   Reevaluation: After the interventions noted above, I reevaluated the patient and found that they have :improved  Co morbidities that complicate the patient evaluation  Past Medical History:  Diagnosis Date   Dysrhythmia    High cholesterol    Hypertension    IBS (irritable bowel syndrome)     Rheumatoid arthritis (HCC)    Tachycardia       Dispostion: I considered admission for this patient, but at this time she does not meet inpatient criteria for admission and will be discharged with outpatient follow-up     Final Clinical Impression(s) / ED Diagnoses Final diagnoses:  Epigastric pain     @PCDICTATION @    Mylen Mangan, Alyse July, MD 06/21/23 1449

## 2023-06-21 NOTE — ED Triage Notes (Signed)
 Pt c/o of N/V/D x24 hours w/ upper abd pain

## 2023-07-11 ENCOUNTER — Telehealth: Payer: Self-pay | Admitting: Rheumatology

## 2023-07-11 NOTE — Telephone Encounter (Signed)
 Pt called stating both of her hips are in pain and she would like to know if she could get an injection in both of those. Pt stated she had them done before. Pt would like them done if possible on Friday.

## 2023-07-11 NOTE — Telephone Encounter (Signed)
 Ok to schedule on 07/22/23--need to be spaced by 3 months from previous injections.

## 2023-07-11 NOTE — Telephone Encounter (Signed)
 I called patient, f/u and bil hip injections scheduled 7//25/2025.

## 2023-07-15 ENCOUNTER — Encounter: Payer: 59 | Attending: Physician Assistant | Admitting: *Deleted

## 2023-07-15 VITALS — BP 118/82 | HR 100 | Temp 98.6°F | Resp 16

## 2023-07-15 DIAGNOSIS — M810 Age-related osteoporosis without current pathological fracture: Secondary | ICD-10-CM | POA: Diagnosis not present

## 2023-07-15 MED ORDER — DENOSUMAB 60 MG/ML ~~LOC~~ SOSY
60.0000 mg | PREFILLED_SYRINGE | Freq: Once | SUBCUTANEOUS | Status: AC
Start: 2023-07-15 — End: 2023-07-15
  Administered 2023-07-15: 60 mg via SUBCUTANEOUS

## 2023-07-15 NOTE — Progress Notes (Unsigned)
 Office Visit Note  Patient: Tammy Jensen             Date of Birth: 05-Jun-1950           MRN: [id number]             PCP: Halbert Mariano SQUIBB, DO Referring: Halbert Mariano SQUIBB, DO Visit Date: 07/29/2023 Occupation: @GUAROCC @  Subjective:  Bilateral trochanteric bursitis   History of Present Illness: Tammy Jensen is a 73 y.o. female with history of seropositive rheumatoid arthritis and osteoporosis.  Patient remains on  Plaquenil  200 mg 1 tablet by mouth twice daily--started on August 14, 2019.  She is tolerating Plaquenil  without any side effects and has not had any recent gaps in therapy.  She denies any signs or symptoms of a rheumatoid arthritis flare.  Patient presents today with a recurrence of discomfort on the lateral aspect of both hips consistent with trochanteric bursitis.  She had bilateral trochanteric bursa cortisone injections performed on 04/22/2023 which brought a significant relief that her symptoms have recurred.  She has also been experiencing increased lower back pain.  She has intermittent symptoms of right-sided radiculopathy as well as numbness to the right foot if standing for prolonged period of time.  She has not yet followed up with a spine specialist as recommended at her last visit.  She requested a referral today.  She has been taking gabapentin  every morning as well as Aleve for symptomatic relief.     Activities of Daily Living:  Patient denies any morning stiffness  Patient Denies nocturnal pain.  Difficulty dressing/grooming: Denies Difficulty climbing stairs: Denies Difficulty getting out of chair: Denies Difficulty using hands for taps, buttons, cutlery, and/or writing: Denies  Review of Systems  Constitutional:  Negative for fatigue.  HENT:  Negative for mouth sores and mouth dryness.   Eyes:  Negative for dryness.  Respiratory:  Positive for shortness of breath.   Cardiovascular:  Negative for chest pain and palpitations.   Gastrointestinal:  Negative for blood in stool, constipation and diarrhea.  Endocrine: Negative for increased urination.  Genitourinary:  Negative for involuntary urination.  Musculoskeletal:  Positive for joint pain, joint pain, joint swelling, myalgias, muscle weakness, muscle tenderness and myalgias. Negative for gait problem and morning stiffness.  Skin:  Positive for color change. Negative for rash, hair loss and sensitivity to sunlight.  Allergic/Immunologic: Negative for susceptible to infections.  Neurological:  Negative for dizziness and headaches.  Hematological:  Negative for swollen glands.  Psychiatric/Behavioral:  Negative for depressed mood and sleep disturbance. The patient is not nervous/anxious.     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/2020   Rheumatoid arthritis with rheumatoid factor of multiple sites without organ or systems involvement (HCC) 09/06/2019   High risk medication use 09/06/2019   Elevated LFTs 09/06/2019   Age-related osteoporosis without current pathological fracture 09/06/2019   Dyslipidemia 08/14/2019   Abnormal electrocardiogram 03/12/2019   Chest pain 03/12/2019   HTN (hypertension) 03/12/2019   Obesity 03/12/2019   Family history of colon cancer 05/28/2016    Past Medical History:  Diagnosis Date   Dysrhythmia    High cholesterol    Hypertension    IBS (irritable bowel syndrome)    Rheumatoid arthritis (  HCC)    Tachycardia     Family History  Problem Relation Age of Onset   Colon cancer Father    Colon cancer Sister    Heart disease Mother    Dementia Mother    Arthritis Mother    Hemachromatosis Son    Hemachromatosis Son    Past Surgical History:   Procedure Laterality Date   BIOPSY  04/14/2022   Procedure: BIOPSY;  Surgeon: Eartha Angelia Sieving, MD;  Location: [facility];  Service: Gastroenterology;;   COLONOSCOPY N/A 10/07/2016   Procedure: COLONOSCOPY;  Surgeon: Golda Claudis PENNER, MD;  Location: [facility];  Service: Endoscopy;  Laterality: N/A;  730   COLONOSCOPY WITH PROPOFOL  N/A 04/14/2022   Procedure: COLONOSCOPY WITH PROPOFOL ;  Surgeon: Eartha Angelia Sieving, MD;  Location: [facility];  Service: Gastroenterology;  Laterality: N/A;  915am, asa 2   POLYPECTOMY  04/14/2022   Procedure: POLYPECTOMY;  Surgeon: Eartha Angelia Sieving, MD;  Location: [facility];  Service: Gastroenterology;;   RECONSTRUCTION OF EYELID  09/02/2021   REVERSE SHOULDER ARTHROPLASTY Right 11/01/2022   Procedure: RIGHT REVERSE SHOULDER ARTHROPLASTY;  Surgeon: Onesimo Oneil LABOR, MD;  Location: [facility];  Service: Orthopedics;  Laterality: Right;  RNFA NEEDED   TUBAL LIGATION     VAGINAL HYSTERECTOMY N/A 10/07/2021   Procedure: HYSTERECTOMY VAGINAL;  Surgeon: Jayne Vonn DEL, MD;  Location: [facility];  Service: Gynecology;  Laterality: N/A;   Social History   Social History Narrative   Not on file   Immunization History  Administered Date(s) Administered   PFIZER(Purple Top)SARS-COV-2 Vaccination 09/20/2019, 10/15/2019     Objective: Vital Signs: BP 125/76 (BP Location: Left Arm, Patient Position: Sitting, Cuff Size: Normal)   Pulse (!) 106   Resp 16   Ht 5' 2 (1.575 m)   Wt 170 lb 14.4 oz (77.5 kg)   BMI 31.26 kg/m    Physical Exam Vitals and nursing note reviewed.  Constitutional:      Appearance: She is well-developed.  HENT:     Head: Normocephalic and atraumatic.  Eyes:     Conjunctiva/sclera: Conjunctivae normal.  Cardiovascular:     Rate and Rhythm: Normal rate and regular rhythm.     Heart sounds: Normal heart sounds.  Pulmonary:     Effort: Pulmonary effort is normal.     Breath sounds: Normal breath sounds.   Abdominal:     General: Bowel sounds are normal.     Palpations: Abdomen is soft.  Musculoskeletal:     Cervical back: Normal range of motion.  Lymphadenopathy:     Cervical: No cervical adenopathy.  Skin:    General: Skin is warm and dry.     Capillary Refill: Capillary refill takes less than 2 seconds.  Neurological:     Mental Status: She is alert and oriented to person, place, and time.  Psychiatric:        Behavior: Behavior normal.      Musculoskeletal Exam: C-spine has good range of motion.  Limited mobility of the lumbar spine.  Midline spinal tenderness in the lumbar region.  Right shoulder replacement has limited internal rotation.  Left shoulder has full range of motion.  Elbow joints, wrist joints, MCPs, PIPs, DIPs have good range of motion with no synovitis.  Complete fist formation bilaterally.  Hip joints have good range of motion with no groin pain.  Tenderness over bilateral trochanteric bursa.  Knee joints have good range of motion no warmth or effusion.  Ankle  joints have good range of motion with no tenderness or joint swelling.   CDAI Exam: CDAI Score: -- Patient Global: --; Provider Global: -- Swollen: --; Tender: -- Joint Exam 07/29/2023   No joint exam has been documented for this visit   There is currently no information documented on the homunculus. Go to the Rheumatology activity and complete the homunculus joint exam.  Investigation: No additional findings.  Imaging: No results found.   Recent Labs: Lab Results  Component Value Date   WBC 8.2 06/21/2023   HGB 14.0 06/21/2023   PLT 270 06/21/2023   NA 136 06/21/2023   K 4.0 06/21/2023   CL 103 06/21/2023   CO2 21 (L) 06/21/2023   GLUCOSE 93 06/21/2023   BUN 15 06/21/2023   CREATININE 0.73 06/21/2023   BILITOT 1.4 (H) 06/21/2023   ALKPHOS 62 06/21/2023   AST 29 06/21/2023   ALT 24 06/21/2023   PROT 7.5 06/21/2023   ALBUMIN 4.2 06/21/2023   CALCIUM  9.1 06/21/2023   GFRAA 89  04/25/2020    Speciality Comments: PLQ Eye Exam: 02/25/2023 WNL  [facility] Follow up in 1 year.  Procedures:  Large Joint Inj: bilateral greater trochanter on 07/29/2023 10:31 AM Indications: pain Details: 27 G 1.5 in needle, lateral approach  Arthrogram: No  Medications (Right): 1.5 mL lidocaine  1 %; 40 mg triamcinolone  acetonide 40 MG/ML Aspirate (Right): 0 mL Medications (Left): 1.5 mL lidocaine  1 %; 40 mg triamcinolone  acetonide 40 MG/ML Aspirate (Left): 0 mL Outcome: tolerated well, no immediate complications Procedure, treatment alternatives, risks and benefits explained, specific risks discussed. Consent was given by the patient. Immediately prior to procedure a time out was called to verify the correct patient, procedure, equipment, support staff and site/side marked as required. Patient was prepped and draped in the usual sterile fashion.     Allergies: Patient has no known allergies.    Referral spine specialist     Assessment / Plan:     Visit Diagnoses: Rheumatoid arthritis with rheumatoid factor of multiple sites without organ or systems involvement [facility]): She has no synovitis on examination today.  She has not had any signs or symptoms of a rheumatoid arthritis flare.  She has clinically been doing well taking Plaquenil  200 mg 1 tablet by mouth twice daily.  She is tolerating Plaquenil  without any side effects and has not had any gaps in therapy.  She is not experiencing any morning stiffness, nocturnal pain, or difficulty performing ADLs.  No medication changes will be made at this time.  She was advised to notify us  if she develops any signs or symptoms of a flare.  She will follow-up in the office in 5 months or sooner if needed.  High risk medication use - Plaquenil  200 mg 1 tablet by mouth twice daily started on August 14, 2019.  PLQ Eye Exam: 02/25/2023 WNL [facility] Follow up in 1 year.  Cbc and CMP updated on 06/21/23.    Status  post reverse total replacement of right shoulder - Performed by Dr. Onesimo on 11/01/22.  Doing well.  Limited internal rotation.  She has been completing home exercises daily.  Chronic left shoulder pain: She has good range of motion of the left shoulder on examination today.  No discomfort at this time.  Trochanteric bursitis of both hips - She presents today with a recurrence of pain on the lateral aspect of both hips.  She has tenderness over both trochanteric bursa.  She had  cortisone injections performed on 04/22/2023 but provided significant relief but her symptoms have recurred.  Patient requested bilateral trochanteric bursa cortisone injections today.  Procedure notes were completed above.  Aftercare was discussed.  She was advised to notify us  if her symptoms persist or worsen.  Plan: Large Joint Inj: bilateral greater trochanter  Lumbar facet arthropathy: Patient continues to experience chronic lower back pain.  She has midline spinal tenderness in the lower lumbar region.  She has been experiencing intermittent right-sided radiculopathy.  If she is standing for prolonged periods of time she experiences numbness down the right leg to her foot.  Discussed the importance of seeing a spine specialist for further evaluation and management.  A referral to a spine specialist will be placed today.  Pes cavus: Discussed importance of wearing proper fitting shoes.  Age-related osteoporosis without current pathological fracture:  DEXA updated on 01/08/22: BMD as determined from [facility] L1-L2 is 0.777 g/cm2 with a T-Score of -3.2. Previous DEXA 03/19/2019 BMD as determined from [facility] L1-L2 is 0.763 g/cm2 with a T-Scoreof -3.3.   Fosamax May 2021-advised to discontinue fosamax by her PCP.  She took Fosamax for a total of 3 years. Patient received first Prolia  injection on 07/05/2022.  Second Prolia  injection on 01/06/2023.  Third dose of Prolia  administered on 07/15/2023. She continues to take vitamin D  2000  units daily.  No recent falls or fractures. Due to update DEXA in January 2026.  Vitamin D  deficiency: She is taking vitamin D  2000 units daily.  Other medical conditions are listed as follows:  Essential hypertension: Blood pressure was 125/76 today in the office.   Dyslipidemia  History of IBS  Seasonal allergies  Orders: Orders Placed This Encounter  Procedures   Large Joint Inj: bilateral greater trochanter   Ambulatory referral to Spine Surgery   Meds ordered this encounter  Medications   lidocaine  (XYLOCAINE ) 1 % (with pres) injection 1.5 mL   lidocaine  (XYLOCAINE ) 1 % (with pres) injection 1.5 mL   triamcinolone  acetonide (KENALOG -40) injection 40 mg   triamcinolone  acetonide (KENALOG -40) injection 40 mg     Follow-Up Instructions: Return in about 5 months (around 12/29/2023) for Rheumatoid arthritis.   Waddell CHRISTELLA Craze, PA-C  Note - This record has been created using Dragon software.  Chart creation errors have been sought, but may not always  have been located. Such creation errors do not reflect on  the standard of medical care.

## 2023-07-15 NOTE — Progress Notes (Signed)
 Diagnosis: Osteoporosis  Provider:  Cheryl Birmingham, [facility]  Procedure: Injection  Prolia  (Denosumab ), Dose: 60 mg, Site: subcutaneous, Number of injections: 1  Injection Site(s): Left arm  Post Care: Observation period completed  Discharge: Condition: Good, Destination: Home . AVS Provided  Performed by:  Baldwin Darice Helling, RN

## 2023-07-27 ENCOUNTER — Other Ambulatory Visit: Payer: Self-pay | Admitting: Orthopedic Surgery

## 2023-07-27 DIAGNOSIS — M75111 Incomplete rotator cuff tear or rupture of right shoulder, not specified as traumatic: Secondary | ICD-10-CM

## 2023-07-27 DIAGNOSIS — G8929 Other chronic pain: Secondary | ICD-10-CM

## 2023-07-29 ENCOUNTER — Encounter: Payer: Self-pay | Admitting: Physician Assistant

## 2023-07-29 ENCOUNTER — Encounter: Admitting: Physician Assistant

## 2023-07-29 VITALS — BP 125/76 | HR 106 | Resp 16 | Ht 62.0 in | Wt 170.9 lb

## 2023-07-29 DIAGNOSIS — M47816 Spondylosis without myelopathy or radiculopathy, lumbar region: Secondary | ICD-10-CM

## 2023-07-29 DIAGNOSIS — Z96611 Presence of right artificial shoulder joint: Secondary | ICD-10-CM

## 2023-07-29 DIAGNOSIS — M810 Age-related osteoporosis without current pathological fracture: Secondary | ICD-10-CM

## 2023-07-29 DIAGNOSIS — E785 Hyperlipidemia, unspecified: Secondary | ICD-10-CM

## 2023-07-29 DIAGNOSIS — Q667 Congenital pes cavus, unspecified foot: Secondary | ICD-10-CM

## 2023-07-29 DIAGNOSIS — M0579 Rheumatoid arthritis with rheumatoid factor of multiple sites without organ or systems involvement: Secondary | ICD-10-CM | POA: Diagnosis not present

## 2023-07-29 DIAGNOSIS — J302 Other seasonal allergic rhinitis: Secondary | ICD-10-CM

## 2023-07-29 DIAGNOSIS — G8929 Other chronic pain: Secondary | ICD-10-CM

## 2023-07-29 DIAGNOSIS — Z8719 Personal history of other diseases of the digestive system: Secondary | ICD-10-CM

## 2023-07-29 DIAGNOSIS — E559 Vitamin D deficiency, unspecified: Secondary | ICD-10-CM

## 2023-07-29 DIAGNOSIS — Z79899 Other long term (current) drug therapy: Secondary | ICD-10-CM | POA: Diagnosis not present

## 2023-07-29 DIAGNOSIS — M7061 Trochanteric bursitis, right hip: Secondary | ICD-10-CM

## 2023-07-29 DIAGNOSIS — M7062 Trochanteric bursitis, left hip: Secondary | ICD-10-CM

## 2023-07-29 DIAGNOSIS — M25512 Pain in left shoulder: Secondary | ICD-10-CM

## 2023-07-29 DIAGNOSIS — I10 Essential (primary) hypertension: Secondary | ICD-10-CM

## 2023-07-29 MED ORDER — TRIAMCINOLONE ACETONIDE 40 MG/ML IJ SUSP
40.0000 mg | INTRAMUSCULAR | Status: AC | PRN
  Administered 2023-07-29: 40 mg via INTRA_ARTICULAR

## 2023-07-29 MED ORDER — LIDOCAINE HCL 1 % IJ SOLN
1.5000 mL | INTRAMUSCULAR | Status: AC | PRN
  Administered 2023-07-29: 1.5 mL

## 2023-07-29 NOTE — Addendum Note (Signed)
 Addended by: CHERYL WADDELL HERO on: 07/29/2023 12:26 PM   Modules accepted: Orders

## 2023-07-29 NOTE — Patient Instructions (Signed)

## 2023-08-16 ENCOUNTER — Telehealth: Payer: Self-pay | Admitting: *Deleted

## 2023-08-16 NOTE — Telephone Encounter (Signed)
Patient contacted the office and left message requesting a return call.   Attempted to contact the patient and left message for patient to call the office.

## 2023-08-19 ENCOUNTER — Ambulatory Visit: Admitting: Obstetrics & Gynecology

## 2023-09-13 ENCOUNTER — Other Ambulatory Visit (INDEPENDENT_AMBULATORY_CARE_PROVIDER_SITE_OTHER): Payer: Self-pay

## 2023-09-13 ENCOUNTER — Ambulatory Visit (INDEPENDENT_AMBULATORY_CARE_PROVIDER_SITE_OTHER): Admitting: Orthopedic Surgery

## 2023-09-13 DIAGNOSIS — Z96611 Presence of right artificial shoulder joint: Secondary | ICD-10-CM | POA: Diagnosis not present

## 2023-09-13 DIAGNOSIS — M25511 Pain in right shoulder: Secondary | ICD-10-CM

## 2023-09-13 DIAGNOSIS — G8929 Other chronic pain: Secondary | ICD-10-CM | POA: Diagnosis not present

## 2023-09-13 NOTE — Progress Notes (Unsigned)
 Orthopaedic Postop Note  Assessment: Tammy Jensen is a 73 y.o. female s/p Right Reverse Shoulder Arthroplasty  DOS: 11/01/2022  Plan: Tammy Jensen sustained a fall a little over a month ago, and sustained multiple pelvic fractures.  These are being treated by another provider.  However, she was concerned about her right shoulder.  She has noticed worsening pain since the fall.  No imaging until today.  Repeat radiographs today demonstrate stable alignment.  There is been no change in the overall appearance of the right reverse shoulder arthroplasty.  Current discomfort is likely related to use of the walker, and trying to offload the left leg, as the left side of her pelvis has been injured.  Provided reassurance.  She is scheduled to see me in approximately 6-8 weeks, so we will keep this appointment.  She states understanding.  If she has any further issues or questions, she will contact the clinic.  Follow-up: Return for Previously scheduled appointment.  XR at next visit: Right shoulder  Subjective:  Chief Complaint  Patient presents with   Shoulder Pain    R after fall 4 wks ago. States she didn't land on the shoulder but has been aching more often and wants it evaluated.   R RSA DOS 11/01/22    History of Present Illness: Tammy Jensen is a 73 y.o. female who returns to clinic for repeat evaluation of her right shoulder.  Surgery was approximately 10-11 months ago.  She has been doing well.  Approximately 1 month ago, she fell.  She was evaluated at an outside hospital.  She was noted to have multiple pelvic fractures.  Since that fall, she has been using a walker.  She states that she did not land on her right shoulder with the fall.  She did not have immediate pain in the right shoulder.  However, since the fall, she has noted worsening, aching pains in the right shoulder.    Review of Systems: No fevers or chills No numbness or tingling No Chest Pain No shortness of  breath   Objective: There were no vitals taken for this visit.  Physical Exam:  Alert and oriented, no acute distress  Surgical incision is healing well, no surrounding erythema or drainage Sensation intact in the axillary nerve distribution Active motion intact in the hand 2+ radial pulse Mild tenderness over the lateral and posterior aspect of the shoulder.  She is intact forward flexion, 160 degrees.  Abduction 110 degrees.  Fingers are warm and well-perfused.  IMAGING: I personally ordered and reviewed the following images:  X-rays of the right shoulder were obtained in clinic today.  These are compared available x-rays.  Right reverse shoulder arthroplasty remains in stable alignment.  No lucency around the prosthesis.  No fracture.  No subsidence.  No dislocation.  No bony lesions.  Impression: Stable right reverse shoulder arthroplasty  Tammy DELENA Horde, MD 09/14/2023 10:25 AM

## 2023-09-14 ENCOUNTER — Encounter: Payer: Self-pay | Admitting: Orthopedic Surgery

## 2023-09-15 ENCOUNTER — Telehealth: Payer: Self-pay | Admitting: Orthopedic Surgery

## 2023-09-15 NOTE — Telephone Encounter (Signed)
 Dr. Onesimo pt - spoke w/the pt, she is requesting a script for Prednisone .

## 2023-09-16 MED ORDER — PREDNISONE 10 MG (21) PO TBPK: ORAL_TABLET | ORAL | 0 refills | Status: AC

## 2023-09-16 NOTE — Addendum Note (Signed)
 Addended by: ONESIMO ANES A on: 09/16/2023 07:42 AM   Modules accepted: Orders

## 2023-09-23 ENCOUNTER — Ambulatory Visit: Admitting: Physician Assistant

## 2023-09-23 ENCOUNTER — Other Ambulatory Visit: Payer: Self-pay | Admitting: Orthopedic Surgery

## 2023-09-23 DIAGNOSIS — G8929 Other chronic pain: Secondary | ICD-10-CM

## 2023-09-23 DIAGNOSIS — M75111 Incomplete rotator cuff tear or rupture of right shoulder, not specified as traumatic: Secondary | ICD-10-CM

## 2023-10-19 ENCOUNTER — Encounter (INDEPENDENT_AMBULATORY_CARE_PROVIDER_SITE_OTHER): Payer: Self-pay | Admitting: Gastroenterology

## 2023-11-07 ENCOUNTER — Encounter: Payer: Self-pay | Admitting: Radiology

## 2023-11-08 ENCOUNTER — Other Ambulatory Visit: Payer: Self-pay | Admitting: Orthopedic Surgery

## 2023-11-08 DIAGNOSIS — G8929 Other chronic pain: Secondary | ICD-10-CM

## 2023-11-08 DIAGNOSIS — M75111 Incomplete rotator cuff tear or rupture of right shoulder, not specified as traumatic: Secondary | ICD-10-CM

## 2023-11-11 ENCOUNTER — Ambulatory Visit: Admitting: Orthopedic Surgery

## 2023-12-06 ENCOUNTER — Ambulatory Visit: Admitting: Orthopedic Surgery

## 2023-12-09 ENCOUNTER — Telehealth: Payer: Self-pay | Admitting: Pharmacist

## 2023-12-09 NOTE — Telephone Encounter (Signed)
 Patient scheduled for Prolia  on 01/20/24 @ [facility]. Will need updated labs at upcoming OV on 12/23/23. CMP and Vitamin D  pended  Sherry Pennant, PharmD, MPH, BCPS, CPP Clinical Pharmacist [facility])

## 2023-12-12 NOTE — Progress Notes (Unsigned)
 Office Visit Note  Patient: Tammy Jensen             Date of Birth: [birth year]           MRN: [id number]             PCP: Halbert Mariano SQUIBB, DO Referring: Halbert Mariano SQUIBB, DO Visit Date: 12/23/2023 Occupation: Data Unavailable  Subjective:    History of Present Illness: Tammy Jensen is a 73 y.o. female with history of seropositive rheumatoid arthritis.  Patient remains on  Plaquenil  200 mg 1 tablet by mouth twice daily.   CBC and CMP updated on 06/21/23. Orders for CBC and CMP released today.  PLQ Eye Exam: 02/25/2023 WNL [facility] Follow up in 1 year.      DEXA updated on 01/08/22: BMD as determined from [facility] L1-L2 is 0.777 g/cm2 with a T-Score of -3.2. Previous DEXA 03/19/2019 BMD as determined from [facility] L1-L2 is 0.763 g/cm2 with a T-Scoreof -3.3.   Fosamax May 2021-advised to discontinue fosamax by her PCP.  She took Fosamax for a total of 3 years. Patient received first Prolia  injection on 07/05/2022.  Second Prolia  injection on 01/06/2023.  Third dose of Prolia  administered on 07/15/2023. She continues to take vitamin D  2000 units daily.  No recent falls or fractures. Due to update DEXA in January 2026.    Activities of Daily Living:  Patient reports morning stiffness for *** {minute/hour:19697}.   Patient {ACTIONS;DENIES/REPORTS:21021675::Denies} nocturnal pain.  Difficulty dressing/grooming: {ACTIONS;DENIES/REPORTS:21021675::Denies} Difficulty climbing stairs: {ACTIONS;DENIES/REPORTS:21021675::Denies} Difficulty getting out of chair: {ACTIONS;DENIES/REPORTS:21021675::Denies} Difficulty using hands for taps, buttons, cutlery, and/or writing: {ACTIONS;DENIES/REPORTS:21021675::Denies}  No Rheumatology ROS completed.   PMFS History:  Patient Active Problem List   Diagnosis Date Noted   Rotator cuff arthropathy of right shoulder 11/01/2022   Constipation 03/11/2022   Liver hemangioma 03/11/2022   Mixed hyperlipidemia 01/29/2021    Rheumatoid arteritis (HCC) 06/26/2020   Pernicious anemia 06/26/2020   Mild valvular heart disease 06/26/2020   Rheumatoid arthritis with rheumatoid factor of multiple sites without organ or systems involvement (HCC) 09/06/2019   High risk medication use 09/06/2019   Elevated LFTs 09/06/2019   Age-related osteoporosis without current pathological fracture 09/06/2019   Dyslipidemia 08/14/2019   Abnormal electrocardiogram 03/12/2019   Chest pain 03/12/2019   HTN (hypertension) 03/12/2019   Obesity 03/12/2019   Family history of colon cancer 05/28/2016    Past Medical History:  Diagnosis Date   Dysrhythmia    High cholesterol    Hypertension    IBS (irritable bowel syndrome)    Rheumatoid arthritis (HCC)    Tachycardia     Family History  Problem Relation Age of Onset   Colon cancer Father    Colon cancer Sister    Heart disease Mother    Dementia Mother    Arthritis Mother    Hemachromatosis Son    Hemachromatosis Son    Past Surgical History:  Procedure Laterality Date   BIOPSY  04/14/2022   Procedure: BIOPSY;  Surgeon: Eartha Angelia Sieving, MD;  Location: [facility];  Service: Gastroenterology;;   COLONOSCOPY N/A 10/07/2016   Procedure: COLONOSCOPY;  Surgeon: Golda Claudis PENNER, MD;  Location: [facility];  Service: Endoscopy;  Laterality: N/A;  730   COLONOSCOPY WITH PROPOFOL  N/A 04/14/2022   Procedure: COLONOSCOPY WITH PROPOFOL ;  Surgeon: Eartha Angelia Sieving, MD;  Location: [facility];  Service: Gastroenterology;  Laterality: N/A;  915am, asa 2   POLYPECTOMY  04/14/2022   Procedure:  POLYPECTOMY;  Surgeon: Eartha Flavors, Toribio, MD;  Location: [facility];  Service: Gastroenterology;;   RECONSTRUCTION OF EYELID  09/02/2021   REVERSE SHOULDER ARTHROPLASTY Right 11/01/2022   Procedure: RIGHT REVERSE SHOULDER ARTHROPLASTY;  Surgeon: Onesimo Oneil LABOR, MD;  Location: [facility];  Service: Orthopedics;  Laterality: Right;  RNFA NEEDED   TUBAL LIGATION      VAGINAL HYSTERECTOMY N/A 10/07/2021   Procedure: HYSTERECTOMY VAGINAL;  Surgeon: Jayne Vonn DEL, MD;  Location: [facility];  Service: Gynecology;  Laterality: N/A;   Social History   Tobacco Use   Smoking status: Never    Passive exposure: Never   Smokeless tobacco: Never  Vaping Use   Vaping status: Never Used  Substance Use Topics   Alcohol use: Not Currently   Drug use: No   Social History   Social History Narrative   Not on file     Immunization History  Administered Date(s) Administered   PFIZER(Purple Top)SARS-COV-2 Vaccination 09/20/2019, 10/15/2019     Objective: Vital Signs: There were no vitals taken for this visit.   Physical Exam Vitals and nursing note reviewed.  Constitutional:      Appearance: She is well-developed.  HENT:     Head: Normocephalic and atraumatic.  Eyes:     Conjunctiva/sclera: Conjunctivae normal.  Cardiovascular:     Rate and Rhythm: Normal rate and regular rhythm.     Heart sounds: Normal heart sounds.  Pulmonary:     Effort: Pulmonary effort is normal.     Breath sounds: Normal breath sounds.  Abdominal:     General: Bowel sounds are normal.     Palpations: Abdomen is soft.  Musculoskeletal:     Cervical back: Normal range of motion.  Lymphadenopathy:     Cervical: No cervical adenopathy.  Skin:    General: Skin is warm and dry.     Capillary Refill: Capillary refill takes less than 2 seconds.  Neurological:     Mental Status: She is alert and oriented to person, place, and time.  Psychiatric:        Behavior: Behavior normal.      Musculoskeletal Exam: ***  CDAI Exam: CDAI Score: -- Patient Global: --; Provider Global: -- Swollen: --; Tender: -- Joint Exam 12/23/2023   No joint exam has been documented for this visit   There is currently no information documented on the homunculus. Go to the Rheumatology activity and complete the homunculus joint exam.  Investigation: No additional findings.  Imaging: No  results found.  Recent Labs: Lab Results  Component Value Date   WBC 8.2 06/21/2023   HGB 14.0 06/21/2023   PLT 270 06/21/2023   NA 136 06/21/2023   K 4.0 06/21/2023   CL 103 06/21/2023   CO2 21 (L) 06/21/2023   GLUCOSE 93 06/21/2023   BUN 15 06/21/2023   CREATININE 0.73 06/21/2023   BILITOT 1.4 (H) 06/21/2023   ALKPHOS 62 06/21/2023   AST 29 06/21/2023   ALT 24 06/21/2023   PROT 7.5 06/21/2023   ALBUMIN 4.2 06/21/2023   CALCIUM  9.1 06/21/2023   GFRAA 89 04/25/2020    Speciality Comments: PLQ Eye Exam: 02/25/2023 WNL  [facility] Follow up in 1 year.  Procedures:  No procedures performed Allergies: Patient has no known allergies.   Assessment / Plan:     Visit Diagnoses: Rheumatoid arthritis with rheumatoid factor of multiple sites without organ or systems involvement (HCC)  High risk medication use  Status post reverse total replacement  of right shoulder  Chronic left shoulder pain  Trochanteric bursitis of both hips  Lumbar facet arthropathy  Pes cavus  Age-related osteoporosis without current pathological fracture  Vitamin D  deficiency  Essential hypertension  Dyslipidemia  Orders: No orders of the defined types were placed in this encounter.  No orders of the defined types were placed in this encounter.   Face-to-face time spent with patient was *** minutes. Greater than 50% of time was spent in counseling and coordination of care.  Follow-Up Instructions: No follow-ups on file.   Waddell CHRISTELLA Craze, PA-C  Note - This record has been created using Dragon software.  Chart creation errors have been sought, but may not always  have been located. Such creation errors do not reflect on  the standard of medical care.

## 2023-12-16 ENCOUNTER — Encounter: Payer: Self-pay | Admitting: Orthopedic Surgery

## 2023-12-16 ENCOUNTER — Ambulatory Visit: Admitting: Orthopedic Surgery

## 2023-12-16 ENCOUNTER — Other Ambulatory Visit

## 2023-12-16 DIAGNOSIS — Z96611 Presence of right artificial shoulder joint: Secondary | ICD-10-CM | POA: Diagnosis not present

## 2023-12-16 NOTE — Progress Notes (Signed)
 Orthopaedic Postop Note  Assessment: Nataly Pacifico is a 73 y.o. female s/p Right Reverse Shoulder Arthroplasty  DOS: 11/01/2022  Plan: Mrs. Heatwole returns to clinic for routine follow-up following right reverse shoulder arthroplasty.  Overall, she is doing well.  She has had some falls recently, which has resulted in some discomfort in the posterior and lateral shoulder.  Otherwise no issues.  She is pleased with the results.  She is doing better in regards to her back and her hip issues.  She is going to be starting some therapy.  Regarding her shoulder, if she has any further issues, she can return to clinic.  She will resume her activities as tolerated, and can continue doing shoulder exercises as tolerated.  Follow-up: Return if symptoms worsen or fail to improve.  XR at next visit: Right shoulder  Subjective:  Chief Complaint  Patient presents with   Shoulder Pain    Right- Total Reverse Shoulder DOS 10/28/24doing ok but I have one spot that bothers me especially if I am in the bed and move its like it catches    History of Present Illness: Wanna Gully is a 73 y.o. female who returns to clinic for repeat evaluation of her right shoulder.  Surgery was a little over a year ago.  She is doing well.  However, she has sustained a couple of falls recently.  Since the most recent fall a few weeks ago, she notes some pain over the lateral shoulder, with occasional catching sensations.  Her motion is good, and she is not taking medicines on a regular basis.  Her back and her hips are feeling better.  She is going to start some physical therapy, she does feel as though she has gotten weak since her injuries after a fall.   Review of Systems: No fevers or chills No numbness or tingling No Chest Pain No shortness of breath   Objective: There were no vitals taken for this visit.  Physical Exam:  Alert and oriented, no acute distress  Surgical incision is healed.  No  surrounding erythema or drainage Sensation intact in the axillary nerve distribution Active motion intact in the hand 2+ radial pulse Mild tenderness over the lateral and posterior aspect of the shoulder.  She is intact forward flexion, 160 degrees.  Abduction 110 degrees.  Fingers are warm and well-perfused.  Minimal tenderness over the lateral shoulder.  No palpable catches or clunks.  IMAGING: I personally ordered and reviewed the following images:  X-rays of the right shoulder were obtained in clinic today.  These are compared to prior x-rays.  Reverse shoulder arthroplasty remains in stable position.  No subsidence.  No lucency around the hardware.  No fractures.  No dislocation.  No bony lesions.  Impression: Stable right reverse shoulder arthroplasty  Oneil DELENA Horde, MD 12/16/2023 10:21 AM

## 2023-12-23 ENCOUNTER — Ambulatory Visit: Attending: Physician Assistant | Admitting: Physician Assistant

## 2023-12-23 ENCOUNTER — Encounter: Payer: Self-pay | Admitting: Physician Assistant

## 2023-12-23 VITALS — BP 128/83 | HR 90 | Temp 97.4°F | Resp 14 | Ht 62.0 in | Wt 175.0 lb

## 2023-12-23 DIAGNOSIS — M810 Age-related osteoporosis without current pathological fracture: Secondary | ICD-10-CM | POA: Diagnosis not present

## 2023-12-23 DIAGNOSIS — M7062 Trochanteric bursitis, left hip: Secondary | ICD-10-CM

## 2023-12-23 DIAGNOSIS — I10 Essential (primary) hypertension: Secondary | ICD-10-CM | POA: Diagnosis not present

## 2023-12-23 DIAGNOSIS — M25512 Pain in left shoulder: Secondary | ICD-10-CM | POA: Diagnosis not present

## 2023-12-23 DIAGNOSIS — G8929 Other chronic pain: Secondary | ICD-10-CM | POA: Diagnosis not present

## 2023-12-23 DIAGNOSIS — Z96611 Presence of right artificial shoulder joint: Secondary | ICD-10-CM | POA: Diagnosis not present

## 2023-12-23 DIAGNOSIS — M47816 Spondylosis without myelopathy or radiculopathy, lumbar region: Secondary | ICD-10-CM | POA: Diagnosis not present

## 2023-12-23 DIAGNOSIS — Q667 Congenital pes cavus, unspecified foot: Secondary | ICD-10-CM | POA: Diagnosis not present

## 2023-12-23 DIAGNOSIS — M0579 Rheumatoid arthritis with rheumatoid factor of multiple sites without organ or systems involvement: Secondary | ICD-10-CM | POA: Diagnosis not present

## 2023-12-23 DIAGNOSIS — E559 Vitamin D deficiency, unspecified: Secondary | ICD-10-CM

## 2023-12-23 DIAGNOSIS — Z79899 Other long term (current) drug therapy: Secondary | ICD-10-CM

## 2023-12-23 DIAGNOSIS — E785 Hyperlipidemia, unspecified: Secondary | ICD-10-CM | POA: Diagnosis not present

## 2023-12-23 DIAGNOSIS — M7061 Trochanteric bursitis, right hip: Secondary | ICD-10-CM | POA: Diagnosis not present

## 2023-12-23 MED ORDER — HYDROXYCHLOROQUINE SULFATE 200 MG PO TABS: 200.0000 mg | ORAL_TABLET | Freq: Two times a day (BID) | ORAL | 0 refills | Status: AC

## 2023-12-23 MED ORDER — LIDOCAINE HCL 1 % IJ SOLN
1.5000 mL | INTRAMUSCULAR | Status: AC | PRN
  Administered 2023-12-23: 1.5 mL

## 2023-12-23 MED ORDER — TRIAMCINOLONE ACETONIDE 40 MG/ML IJ SUSP
40.0000 mg | INTRAMUSCULAR | Status: AC | PRN
  Administered 2023-12-23: 40 mg via INTRA_ARTICULAR

## 2023-12-24 ENCOUNTER — Ambulatory Visit: Payer: Self-pay | Admitting: Physician Assistant

## 2023-12-24 LAB — CBC WITH DIFFERENTIAL/PLATELET
Absolute Lymphocytes: 1496 {cells}/uL (ref 850–3900)
Absolute Monocytes: 452 {cells}/uL (ref 200–950)
Basophils Absolute: 70 {cells}/uL (ref 0–200)
Basophils Relative: 1.2 %
Eosinophils Absolute: 122 {cells}/uL (ref 15–500)
Eosinophils Relative: 2.1 %
HCT: 38.6 % (ref 35.9–46.0)
Hemoglobin: 12.2 g/dL (ref 11.7–15.5)
MCH: 26 pg — ABNORMAL LOW (ref 27.0–33.0)
MCHC: 31.6 g/dL (ref 31.6–35.4)
MCV: 82.1 fL (ref 81.4–101.7)
MPV: 11 fL (ref 7.5–12.5)
Monocytes Relative: 7.8 %
Neutro Abs: 3660 {cells}/uL (ref 1500–7800)
Neutrophils Relative %: 63.1 %
Platelets: 274 Thousand/uL (ref 140–400)
RBC: 4.7 Million/uL (ref 3.80–5.10)
RDW: 14.4 % (ref 11.0–15.0)
Total Lymphocyte: 25.8 %
WBC: 5.8 Thousand/uL (ref 3.8–10.8)

## 2023-12-24 LAB — COMPREHENSIVE METABOLIC PANEL WITH GFR
AG Ratio: 1.7 (calc) (ref 1.0–2.5)
ALT: 15 U/L (ref 6–29)
AST: 19 U/L (ref 10–35)
Albumin: 4.4 g/dL (ref 3.6–5.1)
Alkaline phosphatase (APISO): 90 U/L (ref 37–153)
BUN: 20 mg/dL (ref 7–25)
CO2: 24 mmol/L (ref 20–32)
Calcium: 9.6 mg/dL (ref 8.6–10.4)
Chloride: 105 mmol/L (ref 98–110)
Creat: 0.87 mg/dL (ref 0.60–1.00)
Globulin: 2.6 g/dL (ref 1.9–3.7)
Glucose, Bld: 93 mg/dL (ref 65–99)
Potassium: 4.3 mmol/L (ref 3.5–5.3)
Sodium: 140 mmol/L (ref 135–146)
Total Bilirubin: 0.6 mg/dL (ref 0.2–1.2)
Total Protein: 7 g/dL (ref 6.1–8.1)
eGFR: 70 mL/min/1.73m2

## 2023-12-24 NOTE — Progress Notes (Signed)
 CBC and CMP WNL

## 2023-12-26 ENCOUNTER — Other Ambulatory Visit: Payer: Self-pay | Admitting: Orthopedic Surgery

## 2023-12-26 DIAGNOSIS — M75111 Incomplete rotator cuff tear or rupture of right shoulder, not specified as traumatic: Secondary | ICD-10-CM

## 2023-12-26 DIAGNOSIS — G8929 Other chronic pain: Secondary | ICD-10-CM

## 2024-01-13 ENCOUNTER — Telehealth: Payer: Self-pay

## 2024-01-13 NOTE — Telephone Encounter (Signed)
 Auth Submission: NO AUTH NEEDED Site of care: Site of care: [facility] Payer: [employer] Medication & CPT/J Code(s) submitted: Prolia  (Denosumab ) [id number] Diagnosis Code:  Route of submission (phone, fax, portal): portal Phone # Fax # Auth type: Buy/Bill PB Units/visits requested: 60mg  q93months x 2 doses Reference number: [id number] Approval from: 01/13/24 to 01/03/25

## 2024-01-20 ENCOUNTER — Ambulatory Visit (HOSPITAL_COMMUNITY)
Admission: RE | Admit: 2024-01-20 | Discharge: 2024-01-20 | Disposition: A | Discharge status: Home / Self Care | Source: Ambulatory Visit | Attending: Physician Assistant | Admitting: Physician Assistant

## 2024-01-20 ENCOUNTER — Encounter: Attending: Physician Assistant | Admitting: *Deleted

## 2024-01-20 VITALS — BP 149/90 | HR 94 | Temp 97.8°F | Resp 16

## 2024-01-20 DIAGNOSIS — M810 Age-related osteoporosis without current pathological fracture: Secondary | ICD-10-CM | POA: Insufficient documentation

## 2024-01-20 MED ORDER — DENOSUMAB 60 MG/ML ~~LOC~~ SOSY
60.0000 mg | PREFILLED_SYRINGE | Freq: Once | SUBCUTANEOUS | Status: AC
  Administered 2024-01-20: 60 mg via SUBCUTANEOUS

## 2024-01-20 NOTE — Progress Notes (Signed)
 No significant change in DEXA scan noted when compared to 2024.  Prolia  is a slow acting drug.  Plan to continue current treatment.  Patient should continue to take calcium  and vitamin D  and exercise.

## 2024-01-20 NOTE — Progress Notes (Signed)
 Diagnosis: Osteoporosis  Provider:  Cheryl Birmingham PA-C  Procedure: Injection  Prolia  (Denosumab ), Dose: 60 mg, Site: subcutaneous, Number of injections: 1  Injection Site(s): Left arm  Post Care: Observation period completed  Discharge: Condition: Good, Destination: Home . AVS Declined  Performed by:  Baldwin Darice Helling, RN

## 2024-01-27 ENCOUNTER — Other Ambulatory Visit (HOSPITAL_COMMUNITY)

## 2024-02-10 ENCOUNTER — Other Ambulatory Visit: Payer: Self-pay | Admitting: Orthopedic Surgery

## 2024-02-10 DIAGNOSIS — G8929 Other chronic pain: Secondary | ICD-10-CM

## 2024-02-10 DIAGNOSIS — M75111 Incomplete rotator cuff tear or rupture of right shoulder, not specified as traumatic: Secondary | ICD-10-CM

## 2024-05-25 ENCOUNTER — Ambulatory Visit: Admitting: Physician Assistant

## 2024-06-22 ENCOUNTER — Ambulatory Visit
